# Patient Record
Sex: Female | Born: 1985
Health system: Southern US, Community
[De-identification: ages and names within clinical notes are randomized; demographics above are authoritative.]

## PROBLEM LIST (undated history)

## (undated) DIAGNOSIS — F341 Dysthymic disorder: Secondary | ICD-10-CM

## (undated) DIAGNOSIS — IMO0001 Reserved for inherently not codable concepts without codable children: Secondary | ICD-10-CM

## (undated) DIAGNOSIS — T7840XA Allergy, unspecified, initial encounter: Secondary | ICD-10-CM

## (undated) DIAGNOSIS — K5900 Constipation, unspecified: Secondary | ICD-10-CM

## (undated) DIAGNOSIS — B019 Varicella without complication: Secondary | ICD-10-CM

## (undated) DIAGNOSIS — L309 Dermatitis, unspecified: Secondary | ICD-10-CM

## (undated) DIAGNOSIS — H9319 Tinnitus, unspecified ear: Secondary | ICD-10-CM

## (undated) DIAGNOSIS — R1310 Dysphagia, unspecified: Secondary | ICD-10-CM

## (undated) DIAGNOSIS — Z8669 Personal history of other diseases of the nervous system and sense organs: Secondary | ICD-10-CM

## (undated) DIAGNOSIS — K625 Hemorrhage of anus and rectum: Secondary | ICD-10-CM

## (undated) DIAGNOSIS — Z9641 Presence of insulin pump (external) (internal): Secondary | ICD-10-CM

## (undated) DIAGNOSIS — E109 Type 1 diabetes mellitus without complications: Secondary | ICD-10-CM

## (undated) DIAGNOSIS — E063 Autoimmune thyroiditis: Secondary | ICD-10-CM

## (undated) DIAGNOSIS — Z464 Encounter for fitting and adjustment of orthodontic device: Secondary | ICD-10-CM

## (undated) DIAGNOSIS — K602 Anal fissure, unspecified: Secondary | ICD-10-CM

## (undated) DIAGNOSIS — E785 Hyperlipidemia, unspecified: Secondary | ICD-10-CM

## (undated) DIAGNOSIS — F429 Obsessive-compulsive disorder, unspecified: Secondary | ICD-10-CM

## (undated) DIAGNOSIS — F419 Anxiety disorder, unspecified: Secondary | ICD-10-CM

## (undated) DIAGNOSIS — O358XX0 Maternal care for other (suspected) fetal abnormality and damage, not applicable or unspecified: Secondary | ICD-10-CM

## (undated) DIAGNOSIS — K219 Gastro-esophageal reflux disease without esophagitis: Secondary | ICD-10-CM

## (undated) DIAGNOSIS — L509 Urticaria, unspecified: Secondary | ICD-10-CM

## (undated) DIAGNOSIS — O35BXX0 Maternal care for other (suspected) fetal abnormality and damage, fetal cardiac anomalies, not applicable or unspecified: Secondary | ICD-10-CM

## (undated) DIAGNOSIS — Z973 Presence of spectacles and contact lenses: Secondary | ICD-10-CM

## (undated) DIAGNOSIS — R7989 Other specified abnormal findings of blood chemistry: Secondary | ICD-10-CM

## (undated) DIAGNOSIS — J45909 Unspecified asthma, uncomplicated: Secondary | ICD-10-CM

## (undated) DIAGNOSIS — L739 Follicular disorder, unspecified: Secondary | ICD-10-CM

## (undated) DIAGNOSIS — T783XXA Angioneurotic edema, initial encounter: Secondary | ICD-10-CM

## (undated) DIAGNOSIS — H43811 Vitreous degeneration, right eye: Secondary | ICD-10-CM

## (undated) HISTORY — DX: Follicular disorder, unspecified: L73.9

## (undated) HISTORY — DX: Varicella without complication: B01.9

## (undated) HISTORY — PX: WISDOM TOOTH EXTRACTION: SHX21

## (undated) HISTORY — DX: Autoimmune thyroiditis: E06.3

## (undated) HISTORY — DX: Dermatitis, unspecified: L30.9

## (undated) HISTORY — DX: Anxiety disorder, unspecified: F41.9

## (undated) HISTORY — DX: Angioneurotic edema, initial encounter: T78.3XXA

## (undated) HISTORY — DX: Constipation, unspecified: K59.00

## (undated) HISTORY — DX: Gastro-esophageal reflux disease without esophagitis: K21.9

## (undated) HISTORY — DX: Vitreous degeneration, right eye: H43.811

## (undated) HISTORY — DX: Dysthymic disorder: F34.1

## (undated) HISTORY — DX: Urticaria, unspecified: L50.9

## (undated) HISTORY — DX: Allergy, unspecified, initial encounter: T78.40XA

## (undated) HISTORY — DX: Obsessive-compulsive disorder, unspecified: F42.9

## (undated) HISTORY — DX: Personal history of other diseases of the nervous system and sense organs: Z86.69

## (undated) HISTORY — DX: Hyperlipidemia, unspecified: E78.5

## (undated) HISTORY — DX: Other specified abnormal findings of blood chemistry: R79.89

## (undated) HISTORY — DX: Maternal care for other (suspected) fetal abnormality and damage, not applicable or unspecified: O35.8XX0

## (undated) HISTORY — DX: Dysphagia, unspecified: R13.10

## (undated) HISTORY — DX: Tinnitus, unspecified ear: H93.19

## (undated) HISTORY — DX: Anal fissure, unspecified: K60.2

## (undated) HISTORY — DX: Maternal care for other (suspected) fetal abnormality and damage, fetal cardiac anomalies, not applicable or unspecified: O35.BXX0

## (undated) HISTORY — DX: Hemorrhage of anus and rectum: K62.5

## (undated) HISTORY — PX: DENTAL SURGERY: SHX609

---

## 2016-09-27 LAB — HM PAP SMEAR

## 2017-01-14 DIAGNOSIS — N6320 Unspecified lump in the left breast, unspecified quadrant: Secondary | ICD-10-CM

## 2017-01-14 HISTORY — DX: Unspecified lump in the left breast, unspecified quadrant: N63.20

## 2018-07-15 ENCOUNTER — Other Ambulatory Visit: Payer: Self-pay

## 2018-07-15 ENCOUNTER — Encounter: Payer: Self-pay | Admitting: *Deleted

## 2018-07-18 NOTE — Discharge Instructions (Signed)
[facility] °[facility] °ENDOSCOPIC SINUS SURGERY °[facility] ° °What is Functional Endoscopic Sinus Surgery? ° The Surgery involves making the natural openings of the sinuses larger by removing the bony partitions that separate the sinuses from the nasal cavity.  The natural sinus lining is preserved as much as possible to allow the sinuses to resume normal function after the surgery.  In some patients nasal polyps (excessively swollen lining of the sinuses) may be removed to relieve obstruction of the sinus openings.  The surgery is performed through the nose using lighted scopes, which eliminates the need for incisions on the face.  A septoplasty is a different procedure which is sometimes performed with sinus surgery.  It involves straightening the boy partition that separates the two sides of your nose.  A crooked or deviated septum may need repair if is obstructing the sinuses or nasal airflow.  Turbinate reduction is also often performed during sinus surgery.  The turbinates are bony proturberances from the side walls of the nose which swell and can obstruct the nose in patients with sinus and allergy problems.  Their size can be surgically reduced to help relieve nasal obstruction. ° °What Can Sinus Surgery Do For Me? ° Sinus surgery can reduce the frequency of sinus infections requiring antibiotic treatment.  This can provide improvement in nasal congestion, post-nasal drainage, facial pressure and nasal obstruction.  Surgery will NOT prevent you from ever having an infection again, so it usually only for patients who get infections 4 or more times yearly requiring antibiotics, or for infections that do not clear with antibiotics.  It will not cure nasal allergies, so patients with allergies may still require medication to treat their allergies after surgery. Surgery may improve headaches related to sinusitis, however, some people will continue to  require medication to control sinus headaches related to allergies.  Surgery will do nothing for other forms of headache (migraine, tension or cluster). ° °What Are the Risks of Endoscopic Sinus Surgery? ° Current techniques allow surgery to be performed safely with little risk, however, there are rare complications that patients should be aware of.  Because the sinuses are located around the eyes, there is risk of eye injury, including blindness, though again, this would be quite rare. This is usually a result of bleeding behind the eye during surgery, which puts the vision oat risk, though there are treatments to protect the vision and prevent permanent disrupted by surgery causing a leak of the spinal fluid that surrounds the brain.  More serious complications would include bleeding inside the brain cavity or damage to the brain.  Again, all of these complications are uncommon, and spinal fluid leaks can be safely managed surgically if they occur.  The most common complication of sinus surgery is bleeding from the nose, which may require packing or cauterization of the nose.  Continued sinus have polyps may experience recurrence of the polyps requiring revision surgery.  Alterations of sense of smell or injury to the tear ducts are also rare complications.  ° °What is the Surgery Like, and what is the Recovery? ° The Surgery usually takes a couple of hours to perform, and is usually performed under a general anesthetic (completely asleep).  Patients are usually discharged home after a couple of hours.  Sometimes during surgery it is necessary to pack the nose to control bleeding, and the packing is left in place for 24 - 48 hours, and removed by your surgeon.    If a septoplasty was performed during the procedure, there is often a splint placed which must be removed after 5-7 days.   °Discomfort: Pain is usually mild to moderate, and can be controlled by prescription pain medication or acetaminophen (Tylenol).   Aspirin, Ibuprofen (Advil, Motrin), or Naprosyn (Aleve) should be avoided, as they can cause increased bleeding.  Most patients feel sinus pressure like they have a bad head cold for several days.  Sleeping with your head elevated can help reduce swelling and facial pressure, as can ice packs over the face.  A humidifier may be helpful to keep the mucous and blood from drying in the nose.  ° °Diet: There are no specific diet restrictions, however, you should generally start with clear liquids and a light diet of bland foods because the anesthetic can cause some nausea.  Advance your diet depending on how your stomach feels.  Taking your pain medication with food will often help reduce stomach upset which pain medications can cause. ° °Nasal Saline Irrigation: It is important to remove blood clots and dried mucous from the nose as it is healing.  This is done by having you irrigate the nose at least 3 - 4 times daily with a salt water solution.  We recommend using NeilMed Sinus Rinse (available at the drug store).  Fill the squeeze bottle with the solution, bend over a sink, and insert the tip of the squeeze bottle into the nose ½ of an inch.  Point the tip of the squeeze bottle towards the inside corner of the eye on the same side your irrigating.  Squeeze the bottle and gently irrigate the nose.  If you bend forward as you do this, most of the fluid will flow back out of the nose, instead of down your throat.   The solution should be warm, near body temperature, when you irrigate.   Each time you irrigate, you should use a full squeeze bottle.  ° °Note that if you are instructed to use Nasal Steroid Sprays at any time after your surgery, irrigate with saline BEFORE using the steroid spray, so you do not wash it all out of the nose. °Another product, Nasal Saline Gel (such as AYR Nasal Saline Gel) can be applied in each nostril 3 - 4 times daily to moisture the nose and reduce scabbing or crusting. ° °Bleeding:   Bloody drainage from the nose can be expected for several days, and patients are instructed to irrigate their nose frequently with salt water to help remove mucous and blood clots.  The drainage may be dark red or brown, though some fresh blood may be seen intermittently, especially after irrigation.  Do not blow you nose, as bleeding may occur. If you must sneeze, keep your mouth open to allow air to escape through your mouth. ° °If heavy bleeding occurs: Irrigate the nose with saline to rinse out clots, then spray the nose 3 - 4 times with Afrin Nasal Decongestant Spray.  The spray will constrict the blood vessels to slow bleeding.  Pinch the lower half of your nose shut to apply pressure, and lay down with your head elevated.  Ice packs over the nose may help as well. If bleeding persists despite these measures, you should notify your doctor.  Do not use the Afrin routinely to control nasal congestion after surgery, as it can result in worsening congestion and may affect healing.  ° ° ° °Activity: Return to work varies among patients. Most patients will be   out of work at least 5 - 7 days to recover.  Patient may return to work after they are off of narcotic pain medication, and feeling well enough to perform the functions of their job.  Patients must avoid heavy lifting (over 10 pounds) or strenuous physical for 2 weeks after surgery, so your employer may need to assign you to light duty, or keep you out of work longer if light duty is not possible.  NOTE: you should not drive, operate dangerous machinery, do any mentally demanding tasks or make any important legal or financial decisions while on narcotic pain medication and recovering from the general anesthetic.  °  °Call Your Doctor Immediately if You Have Any of the Following: °1. Bleeding that you cannot control with the above measures °2. Loss of vision, double vision, bulging of the eye or black eyes. °3. Fever over 101 degrees °4. Neck stiffness with  severe headache, fever, nausea and change in mental state. °You are always encourage to call anytime with concerns, however, please call with requests for pain medication refills during office hours. ° °Office Endoscopy: During follow-up visits your doctor will remove any packing or splints that may have been placed and evaluate and clean your sinuses endoscopically.  Topical anesthetic will be used to make this as comfortable as possible, though you may want to take your pain medication prior to the visit.  How often this will need to be done varies from patient to patient.  After complete recovery from the surgery, you may need follow-up endoscopy from time to time, particularly if there is concern of recurrent infection or nasal polyps. ° ° °General Anesthesia, Adult, Care After °These instructions provide you with information about caring for yourself after your procedure. Your health care provider may also give you more specific instructions. Your treatment has been planned according to current medical practices, but problems sometimes occur. Call your health care provider if you have any problems or questions after your procedure. °What can I expect after the procedure? °After the procedure, it is common to have: °· Vomiting. °· A sore throat. °· Mental slowness. ° °It is common to feel: °· Nauseous. °· Cold or shivery. °· Sleepy. °· Tired. °· Sore or achy, even in parts of your body where you did not have surgery. ° °Follow these instructions at home: °For at least 24 hours after the procedure: °· Do not: °? Participate in activities where you could fall or become injured. °? Drive. °? Use heavy machinery. °? Drink alcohol. °? Take sleeping pills or medicines that cause drowsiness. °? Make important decisions or sign legal documents. °? Take care of children on your own. °· Rest. °Eating and drinking °· If you vomit, drink water, juice, or soup when you can drink without vomiting. °· Drink enough fluid to  keep your urine clear or pale yellow. °· Make sure you have little or no nausea before eating solid foods. °· Follow the diet recommended by your health care provider. °General instructions °· Have a responsible adult stay with you until you are awake and alert. °· Return to your normal activities as told by your health care provider. Ask your health care provider what activities are safe for you. °· Take over-the-counter and prescription medicines only as told by your health care provider. °· If you smoke, do not smoke without supervision. °· Keep all follow-up visits as told by your health care provider. This is important. °Contact a health care provider if: °· You   continue to have nausea or vomiting at home, and medicines are not helpful. °· You cannot drink fluids or start eating again. °· You cannot urinate after 8-12 hours. °· You develop a skin rash. °· You have fever. °· You have increasing redness at the site of your procedure. °Get help right away if: °· You have difficulty breathing. °· You have chest pain. °· You have unexpected bleeding. °· You feel that you are having a life-threatening or urgent problem. °This information is not intended to replace advice given to you by your health care provider. Make sure you discuss any questions you have with your health care provider. °Document Released: 03/04/2001 Document Revised: 04/30/2016 Document Reviewed: 11/10/2015 °Elsevier Interactive Patient Education © 2018 [employer] ° °

## 2018-07-24 ENCOUNTER — Encounter
Admission: RE | Disposition: A | Discharge status: Home / Self Care | Payer: Self-pay | Source: Ambulatory Visit | Attending: Otolaryngology

## 2018-07-24 ENCOUNTER — Ambulatory Visit
Admission: RE | Admit: 2018-07-24 | Discharge: 2018-07-24 | Disposition: A | Discharge status: Home / Self Care | Source: Ambulatory Visit | Attending: Otolaryngology | Admitting: Otolaryngology

## 2018-07-24 ENCOUNTER — Ambulatory Visit: Admitting: Anesthesiology

## 2018-07-24 DIAGNOSIS — J3489 Other specified disorders of nose and nasal sinuses: Secondary | ICD-10-CM | POA: Diagnosis not present

## 2018-07-24 DIAGNOSIS — J342 Deviated nasal septum: Secondary | ICD-10-CM | POA: Insufficient documentation

## 2018-07-24 DIAGNOSIS — E109 Type 1 diabetes mellitus without complications: Secondary | ICD-10-CM | POA: Diagnosis not present

## 2018-07-24 DIAGNOSIS — J45909 Unspecified asthma, uncomplicated: Secondary | ICD-10-CM | POA: Insufficient documentation

## 2018-07-24 DIAGNOSIS — Z79899 Other long term (current) drug therapy: Secondary | ICD-10-CM | POA: Diagnosis not present

## 2018-07-24 DIAGNOSIS — J343 Hypertrophy of nasal turbinates: Secondary | ICD-10-CM | POA: Diagnosis not present

## 2018-07-24 HISTORY — DX: Unspecified asthma, uncomplicated: J45.909

## 2018-07-24 HISTORY — DX: Reserved for inherently not codable concepts without codable children: IMO0001

## 2018-07-24 HISTORY — PX: ENDOSCOPIC CONCHA BULLOSA RESECTION: SHX6395

## 2018-07-24 HISTORY — DX: Type 1 diabetes mellitus without complications: E10.9

## 2018-07-24 HISTORY — PX: TURBINATE REDUCTION: SHX6157

## 2018-07-24 HISTORY — DX: Presence of insulin pump (external) (internal): Z96.41

## 2018-07-24 HISTORY — DX: Presence of spectacles and contact lenses: Z97.3

## 2018-07-24 HISTORY — PX: SEPTOPLASTY: SHX2393

## 2018-07-24 HISTORY — DX: Encounter for fitting and adjustment of orthodontic device: Z46.4

## 2018-07-24 LAB — GLUCOSE, CAPILLARY
GLUCOSE-CAPILLARY: 136 mg/dL — AB (ref 70–99)
GLUCOSE-CAPILLARY: 157 mg/dL — AB (ref 70–99)

## 2018-07-24 SURGERY — SEPTOPLASTY, NOSE
Anesthesia: General | Site: Nose | Wound class: Clean Contaminated

## 2018-07-24 MED ORDER — MIDAZOLAM HCL 5 MG/5ML IJ SOLN
INTRAMUSCULAR | Status: DC | PRN
  Administered 2018-07-24: 2 mg via INTRAVENOUS

## 2018-07-24 MED ORDER — DEXAMETHASONE SODIUM PHOSPHATE 4 MG/ML IJ SOLN
INTRAMUSCULAR | Status: DC | PRN
  Administered 2018-07-24: 4 mg via INTRAVENOUS

## 2018-07-24 MED ORDER — OXYMETAZOLINE HCL 0.05 % NA SOLN
2.0000 | Freq: Once | NASAL | Status: AC
  Administered 2018-07-24: 2 via NASAL

## 2018-07-24 MED ORDER — OXYCODONE HCL 5 MG PO TABS: 5.0000 mg | ORAL_TABLET | Freq: Once | ORAL | Status: DC | PRN

## 2018-07-24 MED ORDER — SCOPOLAMINE 1 MG/3DAYS TD PT72
1.0000 | MEDICATED_PATCH | TRANSDERMAL | Status: DC
  Administered 2018-07-24: 1.5 mg via TRANSDERMAL

## 2018-07-24 MED ORDER — ROCURONIUM BROMIDE 100 MG/10ML IV SOLN
INTRAVENOUS | Status: DC | PRN
  Administered 2018-07-24: 20 mg via INTRAVENOUS

## 2018-07-24 MED ORDER — FENTANYL CITRATE (PF) 100 MCG/2ML IJ SOLN: 25.0000 ug | INTRAMUSCULAR | Status: DC | PRN

## 2018-07-24 MED ORDER — LACTATED RINGERS IV SOLN
1000.0000 mL | INTRAVENOUS | Status: DC
  Administered 2018-07-24: 1000 mL via INTRAVENOUS

## 2018-07-24 MED ORDER — FENTANYL CITRATE (PF) 100 MCG/2ML IJ SOLN
INTRAMUSCULAR | Status: DC | PRN
  Administered 2018-07-24: 50 ug via INTRAVENOUS
  Administered 2018-07-24: 25 ug via INTRAVENOUS

## 2018-07-24 MED ORDER — ONDANSETRON HCL 4 MG/2ML IJ SOLN
INTRAMUSCULAR | Status: DC | PRN
  Administered 2018-07-24: 4 mg via INTRAVENOUS

## 2018-07-24 MED ORDER — PHENYLEPHRINE HCL 0.5 % NA SOLN
NASAL | Status: DC | PRN
  Administered 2018-07-24: 15 mL via TOPICAL

## 2018-07-24 MED ORDER — LIDOCAINE HCL (CARDIAC) PF 100 MG/5ML IV SOSY
PREFILLED_SYRINGE | INTRAVENOUS | Status: DC | PRN
  Administered 2018-07-24: 40 mg via INTRAVENOUS

## 2018-07-24 MED ORDER — GLYCOPYRROLATE 0.2 MG/ML IJ SOLN
INTRAMUSCULAR | Status: DC | PRN
  Administered 2018-07-24: .1 mg via INTRAVENOUS

## 2018-07-24 MED ORDER — OXYCODONE HCL 5 MG/5ML PO SOLN: 5.0000 mg | Freq: Once | ORAL | Status: DC | PRN

## 2018-07-24 MED ORDER — LIDOCAINE-EPINEPHRINE 1 %-1:100000 IJ SOLN
INTRAMUSCULAR | Status: DC | PRN
  Administered 2018-07-24: 7 mL

## 2018-07-24 MED ORDER — DEXTROSE 5 % IV SOLN
1000.0000 mg | Freq: Once | INTRAVENOUS | Status: AC
  Administered 2018-07-24: 1000 mg via INTRAVENOUS

## 2018-07-24 MED ORDER — PROPOFOL 10 MG/ML IV BOLUS
INTRAVENOUS | Status: DC | PRN
  Administered 2018-07-24: 120 mg via INTRAVENOUS

## 2018-07-24 SURGICAL SUPPLY — 27 items
CANISTER SUCT 1200ML W/VALVE (MISCELLANEOUS) ×4 IMPLANT
CATH IV 18X1 1/4 SAFELET (CATHETERS) ×4 IMPLANT
COAGULATOR SUCT 8FR VV (MISCELLANEOUS) ×4 IMPLANT
DRAPE HEAD BAR (DRAPES) ×4 IMPLANT
ELECT REM PT RETURN 9FT ADLT (ELECTROSURGICAL) ×4
ELECTRODE REM PT RTRN 9FT ADLT (ELECTROSURGICAL) ×2 IMPLANT
GLOVE PI ULTRA LF STRL 7.5 (GLOVE) ×4 IMPLANT
GLOVE PI ULTRA NON LATEX 7.5 (GLOVE) ×4
IV CATH 18X1 1/4 SAFELET (CATHETERS) ×2
KIT TURNOVER KIT A (KITS) ×4 IMPLANT
NEEDLE ANESTHESIA  27G X 3.5 (NEEDLE) ×2
NEEDLE ANESTHESIA 27G X 3.5 (NEEDLE) ×2 IMPLANT
PACK DRAPE NASAL/ENT (PACKS) ×4 IMPLANT
PACKING NASAL EPIS 4X2.4 XEROG (MISCELLANEOUS) ×4 IMPLANT
PATTIES SURGICAL .5 X3 (DISPOSABLE) ×4 IMPLANT
SOL ANTI-FOG 6CC FOG-OUT (MISCELLANEOUS) ×2 IMPLANT
SOL FOG-OUT ANTI-FOG 6CC (MISCELLANEOUS) ×2
SPLINT NASAL SEPTAL BLV .50 ST (MISCELLANEOUS) ×4 IMPLANT
STRAP BODY AND KNEE 60X3 (MISCELLANEOUS) ×8 IMPLANT
SUT CHROMIC 3-0 (SUTURE) ×2
SUT CHROMIC 3-0 KS 27XMFL CR (SUTURE) ×2
SUT ETHILON 3-0 KS 30 BLK (SUTURE) ×4 IMPLANT
SUT PLAIN GUT 4-0 (SUTURE) ×4 IMPLANT
SUTURE CHRMC 3-0 KS 27XMFL CR (SUTURE) ×2 IMPLANT
SYR 3ML LL SCALE MARK (SYRINGE) ×4 IMPLANT
TOWEL OR 17X26 4PK STRL BLUE (TOWEL DISPOSABLE) ×4 IMPLANT
WATER STERILE IRR 250ML POUR (IV SOLUTION) ×4 IMPLANT

## 2018-07-24 NOTE — Anesthesia Postprocedure Evaluation (Signed)
Anesthesia Post Note  Patient: Danniell Faidley  Procedure(s) Performed: SEPTOPLASTY (N/A Nose) INFERIOR TURBINATE REDUCTION (Bilateral Nose) ENDOSCOPIC CONCHA BULLOSA MIDDLE TURBINATE (Bilateral Nose)  Patient location during evaluation: PACU Anesthesia Type: General Level of consciousness: awake and alert Pain management: pain level controlled Vital Signs Assessment: post-procedure vital signs reviewed and stable Respiratory status: spontaneous breathing Cardiovascular status: blood pressure returned to baseline Postop Assessment: no headache Anesthetic complications: no    Verner Cholunkle, III,  Chloeanne Poteet D

## 2018-07-24 NOTE — Op Note (Signed)
07/24/2018  8:59 AM  [id number]   Pre-Op Dx:  Deviated Nasal Septum, Hypertrophic Inferior Turbinates, concha bullosa of both middle turbinates  Post-op Dx: Same  Proc: Nasal Septoplasty, Bilateral Partial Reduction Inferior Turbinates, endoscopic trimming of the middle turbinate concha bullosa bilaterally  Surg:  Beverly SessionsPaul H Calistro Rauf  Anes:  GOT  EBL: 100 mL  Comp: None  Findings: Septum deviated to the left side.  Large concha bullosa of both middle turbinates.  Enlarged inferior turbinates affecting airway.   Procedure: With the patient in a comfortable supine position,  general orotracheal anesthesia was induced without difficulty.     The patient received preoperative Afrin spray for topical decongestion and vasoconstriction.  Intravenous prophylactic antibiotics were administered.  At an appropriate level, the patient was placed in a semi-sitting position.  Nasal vibrissae were trimmed.   1% Xylocaine with 1:100,000 epinephrine, 7 cc's, was infiltrated into the anterior floor of the nose, into the nasal spine region, into the membranous columella, and finally into the submucoperichondrial plane of the septum on both sides.  Several minutes were allowed for this to take effect.  Cottoniod pledgetts soaked in Afrin and 4% Xylocaine were placed into both nasal cavities and left while the patient was prepped and draped in the standard fashion.  The materials were removed from the nose and observed to be intact and correct in number.  The nose was inspected with a headlight in the 0 degrees scope with the findings as described above.  A left Killian incision was sharply executed and carried down to the quadrangular cartilage. The mucoperichondrium was elelvated along the quadrangular plate back to the bony-cartilaginous junction. The mucoperiostium was then elevated along the ethmoid plate and the vomer. The boney-catilaginous junction was then split with a freer elevator and the  mucoperiosteum was elevated on the opposite side. The mucoperiosteum was then elevated along the maxillary crest as needed to expose the crooked bone of the crest.  Boney spurs of the vomer and maxillary crest were removed with Lenoria Chimeakahashi forceps.  The cartilaginous plate was trimmed along its posterior and inferior borders of about 2 mm of cartilage to free it up inferiorly. Some of the deviated ethmoid plate was then fractured and removed with Takahashi forceps to free up the posterior border of the quadrangular plate and allow it to swing back to the midline. The mucosal flaps were placed back into their anatomic position to allow visualization of the airways. The septum now sat in the midline with an improved airway.  A 3-0 Chromic suture on a Keith needle in used to anchor the inferior septum at the nasal spine with a through and through suture. The mucosal flaps are then sutured together using a through and through whip stitch of 4-0 Plain Gut with a mini-Keith needle.  This was used to close the [place] incision as well.  The middle turbinates were inspected with the 0 degrees scope.  The both were widened and enlarged because of the concha bullosa.  Right side was worse.  The anterior border of the right middle turbinate was then incised to open up into the large concha bullosa.  The lateral wall of the concha bullosa was removed.  This left a scroll shaped middle turbinate on the right side.  Bleeding was controlled with electrocautery along the trimmed edge of the turbinate.  The left side was then trimmed in a similar fashion with incising some of the anterior inferior portion of the middle turbinate and the entire lateral  wall of the concha bullosa.  Again electrocautery was used long as trimmed edge to control bleeding.  This left more open airway on both sides.  The inferior turbinates were then inspected. An incision was created along the inferior aspect of the left inferior turbinate with  removal of some of the inferior soft tissue and bone. Electrocautery was used to control bleeding in the area. The remaining turbinate was then outfractured to open up the airway further. There was no significant bleeding noted. The right turbinate was then trimmed and outfractured in a similar fashion.  The airways were then visualized and showed open passageways on both sides that were significantly improved compared to before surgery. There was no signifcant bleeding.  Xerogel was then placed in the middle meatus on both sides to cover over the trimmed middle turbinate.  This was wetted and covering all the raw edges.  Nasal splints were applied to both sides of the septum using Xomed 0.195mm regular sized splints that were trimmed, and then held in position with a 3-0 Nylon through and through suture.  The patient was turned back over to anesthesia, and awakened, extubated, and taken to the PACU in satisfactory condition.  Dispo:   PACU to home  Plan: Ice, elevation, narcotic analgesia, steroid taper, and prophylactic antibiotics for the duration of indwelling nasal foreign bodies.  We will reevaluate the patient in the office in 6 days and remove the septal splints.  Return to work in 10 days, strenuous activities in two weeks.   Beverly Sessionsaul H Taren Dymek 07/24/2018 8:59 AM

## 2018-07-24 NOTE — Transfer of Care (Signed)
Immediate Anesthesia Transfer of Care Note  Patient: Nicole Carrillo  Procedure(s) Performed: SEPTOPLASTY (N/A Nose) INFERIOR TURBINATE REDUCTION (Bilateral Nose) ENDOSCOPIC CONCHA BULLOSA MIDDLE TURBINATE (Bilateral Nose)  Patient Location: PACU  Anesthesia Type: General ETT  Level of Consciousness: awake, alert  and patient cooperative  Airway and Oxygen Therapy: Patient Spontanous Breathing and Patient connected to supplemental oxygen  Post-op Assessment: Post-op Vital signs reviewed, Patient's Cardiovascular Status Stable, Respiratory Function Stable, Patent Airway and No signs of Nausea or vomiting  Post-op Vital Signs: Reviewed and stable  Complications: No apparent anesthesia complications

## 2018-07-24 NOTE — Anesthesia Preprocedure Evaluation (Signed)
Anesthesia Evaluation  Patient identified by MRN, date of birth, ID band Patient awake    Airway Mallampati: I  TM Distance: >3 FB Neck ROM: full    Dental no notable dental hx.    Pulmonary asthma ,    Pulmonary exam normal breath sounds clear to auscultation       Cardiovascular negative cardio ROS Normal cardiovascular exam     Neuro/Psych    GI/Hepatic negative GI ROS, Neg liver ROS,   Endo/Other  diabetes, Well Controlled, Type 1, Insulin Dependent  Renal/GU      Musculoskeletal   Abdominal   Peds  Hematology negative hematology ROS (+)   Anesthesia Other Findings   Reproductive/Obstetrics                             Anesthesia Physical Anesthesia Plan  ASA: II  Anesthesia Plan: General ETT   Post-op Pain Management:    Induction:   PONV Risk Score and Plan:   Airway Management Planned:   Additional Equipment:   Intra-op Plan:   Post-operative Plan:   Informed Consent: I have reviewed the patients History and Physical, chart, labs and discussed the procedure including the risks, benefits and alternatives for the proposed anesthesia with the patient or authorized representative who has indicated his/her understanding and acceptance.     Plan Discussed with:   Anesthesia Plan Comments:         Anesthesia Quick Evaluation

## 2018-07-24 NOTE — Anesthesia Procedure Notes (Signed)
Procedure Name: Intubation Date/Time: 07/24/2018 7:51 AM Performed by: Jimmy PicketAmyot, Stephanne Greeley, CRNA Pre-anesthesia Checklist: Patient identified, Emergency Drugs available, Suction available, Patient being monitored and Timeout performed Patient Re-evaluated:Patient Re-evaluated prior to induction Oxygen Delivery Method: Circle system utilized Preoxygenation: Pre-oxygenation with 100% oxygen Induction Type: IV induction Ventilation: Mask ventilation without difficulty Laryngoscope Size: Miller and 2 Grade View: Grade I Tube type: Oral Rae Tube size: 7.0 mm Number of attempts: 1 Placement Confirmation: ETT inserted through vocal cords under direct vision,  positive ETCO2 and breath sounds checked- equal and bilateral Tube secured with: Tape Dental Injury: Teeth and Oropharynx as per pre-operative assessment

## 2018-07-24 NOTE — H&P (Signed)
H&P has been reviewedand patient reevaluated,  and no changes necessary. To be downloaded later.  

## 2019-05-07 LAB — TSH: TSH: 1.15 (ref <=5.90)

## 2019-05-07 LAB — HEMOGLOBIN A1C: Hemoglobin A1C: 7.7

## 2019-07-20 LAB — OB RESULTS CONSOLE ABO/RH: RH Type: POSITIVE

## 2019-07-20 LAB — OB RESULTS CONSOLE PLATELET COUNT: Platelets: 370

## 2019-07-20 LAB — OB RESULTS CONSOLE RPR: RPR: NONREACTIVE

## 2019-07-20 LAB — OB RESULTS CONSOLE HGB/HCT, BLOOD
HCT: 36 (ref 29–41)
Hemoglobin: 12.4

## 2019-07-20 LAB — OB RESULTS CONSOLE HEPATITIS B SURFACE ANTIGEN: Hepatitis B Surface Ag: NEGATIVE

## 2019-07-20 LAB — OB RESULTS CONSOLE HIV ANTIBODY (ROUTINE TESTING): HIV: NONREACTIVE

## 2019-07-20 LAB — OB RESULTS CONSOLE ANTIBODY SCREEN: Antibody Screen: NEGATIVE

## 2019-08-13 DIAGNOSIS — J45909 Unspecified asthma, uncomplicated: Secondary | ICD-10-CM | POA: Insufficient documentation

## 2019-08-13 DIAGNOSIS — K219 Gastro-esophageal reflux disease without esophagitis: Secondary | ICD-10-CM | POA: Insufficient documentation

## 2019-08-13 LAB — OB RESULTS CONSOLE GC/CHLAMYDIA
Chlamydia: NEGATIVE
Gonorrhea: NEGATIVE

## 2019-09-14 LAB — OB RESULTS CONSOLE HGB/HCT, BLOOD: Hemoglobin: 12.8

## 2019-09-16 LAB — HEMOGLOBIN A1C: A1c: 6.8

## 2019-10-14 LAB — OB RESULTS CONSOLE HGB/HCT, BLOOD: Hemoglobin: 11.8

## 2019-10-22 LAB — CBC AND DIFFERENTIAL
HCT: 37 (ref 36–46)
Hemoglobin: 11.9 — AB (ref 12.0–16.0)
Platelets: 394 (ref 150–399)
WBC: 9

## 2019-10-22 LAB — BASIC METABOLIC PANEL
BUN: 13 (ref 4–21)
Creatinine: 0.6 (ref 0.5–1.1)

## 2019-10-22 LAB — CBC: RBC: 3.98 (ref 3.87–5.11)

## 2019-11-10 ENCOUNTER — Encounter: Payer: Self-pay | Admitting: *Deleted

## 2019-11-16 ENCOUNTER — Encounter: Admitting: Obstetrics & Gynecology

## 2019-11-23 ENCOUNTER — Other Ambulatory Visit: Payer: Self-pay

## 2019-11-23 ENCOUNTER — Other Ambulatory Visit (HOSPITAL_COMMUNITY)
Admission: RE | Admit: 2019-11-23 | Discharge: 2019-11-23 | Disposition: A | Discharge status: Home / Self Care | Source: Ambulatory Visit | Attending: Obstetrics & Gynecology | Admitting: Obstetrics & Gynecology

## 2019-11-23 ENCOUNTER — Encounter: Payer: Self-pay | Admitting: Obstetrics & Gynecology

## 2019-11-23 ENCOUNTER — Ambulatory Visit (INDEPENDENT_AMBULATORY_CARE_PROVIDER_SITE_OTHER): Payer: 59 | Admitting: Obstetrics & Gynecology

## 2019-11-23 VITALS — BP 125/75 | HR 96 | Wt 124.9 lb

## 2019-11-23 DIAGNOSIS — O26892 Other specified pregnancy related conditions, second trimester: Secondary | ICD-10-CM

## 2019-11-23 DIAGNOSIS — Z3A24 24 weeks gestation of pregnancy: Secondary | ICD-10-CM

## 2019-11-23 DIAGNOSIS — E063 Autoimmune thyroiditis: Secondary | ICD-10-CM

## 2019-11-23 DIAGNOSIS — O0992 Supervision of high risk pregnancy, unspecified, second trimester: Secondary | ICD-10-CM

## 2019-11-23 DIAGNOSIS — N898 Other specified noninflammatory disorders of vagina: Secondary | ICD-10-CM

## 2019-11-23 DIAGNOSIS — O24019 Pre-existing diabetes mellitus, type 1, in pregnancy, unspecified trimester: Secondary | ICD-10-CM | POA: Insufficient documentation

## 2019-11-23 DIAGNOSIS — O2342 Unspecified infection of urinary tract in pregnancy, second trimester: Secondary | ICD-10-CM

## 2019-11-23 DIAGNOSIS — O0990 Supervision of high risk pregnancy, unspecified, unspecified trimester: Secondary | ICD-10-CM | POA: Insufficient documentation

## 2019-11-23 DIAGNOSIS — B3731 Acute candidiasis of vulva and vagina: Secondary | ICD-10-CM

## 2019-11-23 DIAGNOSIS — O24012 Pre-existing diabetes mellitus, type 1, in pregnancy, second trimester: Secondary | ICD-10-CM

## 2019-11-23 DIAGNOSIS — B373 Candidiasis of vulva and vagina: Secondary | ICD-10-CM

## 2019-11-23 LAB — HEMOGLOBIN A1C: A1c: 7

## 2019-11-23 LAB — CYTOLOGY - PAP: Pap: NEGATIVE

## 2019-11-23 NOTE — Patient Instructions (Signed)
TDaP Vaccine Pregnancy Get the Whooping Cough Vaccine While You Are Pregnant (CDC)  It is important for women to get the whooping cough vaccine in the third trimester of each pregnancy. Vaccines are the best way to prevent this disease. There are 2 different whooping cough vaccines. Both vaccines combine protection against whooping cough, tetanus and diphtheria, but they are for different age groups: Tdap: for everyone 11 years or older, including pregnant women  DTaP: for children 2 months through 6 years of age  You need the whooping cough vaccine during each of your pregnancies The recommended time to get the shot is during your 27th through 36th week of pregnancy, preferably during the earlier part of this time period. The Centers for Disease Control and Prevention (CDC) recommends that pregnant women receive the whooping cough vaccine for adolescents and adults (called Tdap vaccine) during the third trimester of each pregnancy. The recommended time to get the shot is during your 27th through 36th week of pregnancy, preferably during the earlier part of this time period. This replaces the original recommendation that pregnant women get the vaccine only if they had not previously received it. The American College of Obstetricians and Gynecologists and the American College of Nurse-Midwives support this recommendation.  You should get the whooping cough vaccine while pregnant to pass protection to your baby frame support disabled and/or not supported in this browser  Learn why Nicole Carrillo decided to get the whooping cough vaccine in her 3rd trimester of pregnancy and how her baby girl was born with some protection against the disease. Also available on YouTube. After receiving the whooping cough vaccine, your body will create protective antibodies (proteins produced by the body to fight off diseases) and pass some of them to your baby before birth. These antibodies provide your baby some short-term  protection against whooping cough in early life. These antibodies can also protect your baby from some of the more serious complications that come along with whooping cough. Your protective antibodies are at their highest about 2 weeks after getting the vaccine, but it takes time to pass them to your baby. So the preferred time to get the whooping cough vaccine is early in your third trimester. The amount of whooping cough antibodies in your body decreases over time. That is why CDC recommends you get a whooping cough vaccine during each pregnancy. Doing so allows each of your babies to get the greatest number of protective antibodies from you. This means each of your babies will get the best protection possible against this disease.  Getting the whooping cough vaccine while pregnant is better than getting the vaccine after you give birth Whooping cough vaccination during pregnancy is ideal so your baby will have short-term protection as soon as he is born. This early protection is important because your baby will not start getting his whooping cough vaccines until he is 2 months old. These first few months of life are when your baby is at greatest risk for catching whooping cough. This is also when he's at greatest risk for having severe, potentially life-threating complications from the infection. To avoid that gap in protection, it is best to get a whooping cough vaccine during pregnancy. You will then pass protection to your baby before he is born. To continue protecting your baby, he should get whooping cough vaccines starting at 2 months old. You may never have gotten the Tdap vaccine before and did not get it during this pregnancy. If so, you should make sure   to get the vaccine immediately after you give birth, before leaving the hospital or birthing center. It will take about 2 weeks before your body develops protection (antibodies) in response to the vaccine. Once you have protection from the vaccine,  you are less likely to give whooping cough to your newborn while caring for him. But remember, your baby will still be at risk for catching whooping cough from others. A recent study looked to see how effective Tdap was at preventing whooping cough in babies whose mothers got the vaccine while pregnant or in the hospital after giving birth. The study found that getting Tdap between 27 through 36 weeks of pregnancy is 85% more effective at preventing whooping cough in babies younger than 2 months old. Blood tests cannot tell if you need a whooping cough vaccine There are no blood tests that can tell you if you have enough antibodies in your body to protect yourself or your baby against whooping cough. Even if you have been sick with whooping cough in the past or previously received the vaccine, you still should get the vaccine during each pregnancy. Breastfeeding may pass some protective antibodies onto your baby By breastfeeding, you may pass some antibodies you have made in response to the vaccine to your baby. When you get a whooping cough vaccine during your pregnancy, you will have antibodies in your breast milk that you can share with your baby as soon as your milk comes in. However, your baby will not get protective antibodies immediately if you wait to get the whooping cough vaccine until after delivering your baby. This is because it takes about 2 weeks for your body to create antibodies. Learn more about the health benefits of breastfeeding.  

## 2019-11-23 NOTE — Progress Notes (Signed)
PRENATAL VISIT NOTE  Subjective:  Nicole Carrillo is a 33 y.o. G1P0 at [redacted]w[redacted]d being seen today for transfer of prenatal care from [place]. Please refer to notes as filed under Media tab.  She is currently monitored for the following issues for this high-risk pregnancy and has Type 1 diabetes mellitus in pregnancy; Hashimoto's thyroiditis; and Supervision of high-risk pregnancy on their problem list.  Patient is followed by Endocrinology at [facility], already had anatomy scan at [facility] and normal fetal ECHO.  Has insulin pump to manage her T1DM.  Normal TSH levels, no thyroid therapy.    Patient reports increased vaginal discharge, faint greenish in color. No irritation.  Contractions: Not present. Vag. Bleeding: None.  Movement: Present. Denies leaking of fluid.   History of UTIs, wants surveillance urine culture today.   The following portions of the patient's history were reviewed and updated as appropriate: allergies, current medications, past family history, past medical history, past social history, past surgical history and problem list.   Objective:   Vitals:   11/23/19 0910  BP: 125/75  Pulse: 96  Weight: 124 lb 14.4 oz (56.7 kg)    Fetal Status: Fetal Heart Rate (bpm): 146   Movement: Present     General:  Alert, oriented and cooperative. Patient is in no acute distress.  Skin: Skin is warm and dry. No rash noted.   Cardiovascular: Normal heart rate noted  Respiratory: Normal respiratory effort, no problems with respiration noted  Abdomen: Soft, gravid, appropriate for gestational age.  Pain/Pressure: Present     Pelvic: Cervical exam deferred        Extremities: Normal range of motion.  Edema: None  Mental Status: Normal mood and affect. Normal behavior. Normal judgment and thought content.   Assessment and Plan:  Pregnancy: G1P0 at [redacted]w[redacted]d 1. Type 1 diabetes mellitus during pregnancy in second trimester 2. Hashimoto's thyroiditis Will continue to be followed by [facility] Endocrinology,  and they will manage insulin pump adjustments. Will follow up their recommendations. MFM referral and scan ordered. Patient aware of need of antenatal testing starting at 32 weeks, and delivery at 39 weeks or earlier.  Will follow up MFM recommendations. - AMB referral to maternal fetal medicine - [place] MFM OB DETAIL +14 WK; Future  3. Vaginal discharge during pregnancy in second trimester Will follow up results and manage accordingly. - Cervicovaginal ancillary only( [place])  4. Urinary tract infection in mother during second trimester of pregnancy - Culture, OB Urine  5. Supervision of high risk pregnancy, antepartum - [facility] AMB BABYSCRIPTS SCHEDULE OPTIMIZATION - fexofenadine (ALLEGRA) 180 MG tablet; Take 180 mg by mouth daily. - aspirin EC 81 MG tablet; Take 81 mg by mouth daily. - Prenatal MV-Min-Fe Fum-FA-DHA (PRENATAL+DHA PO); Take by mouth. - AMB referral to maternal fetal medicine - [place] MFM OB DETAIL +14 WK; Future The nature of [facility] with multiple MDs and other Advanced Practice Providers was explained to patient; also emphasized that residents, students are part of our team.  Preterm labor symptoms and general obstetric precautions including but not limited to vaginal bleeding, contractions, leaking of fluid and fetal movement were reviewed in detail with the patient. Please refer to After Visit Summary for other counseling recommendations.   Return in about 3 weeks (around 12/14/2019) for OFFICE OB Visit, 3rd trimester labs, TDap.  Future Appointments  Date Time Provider Department Center  12/16/2019  2:00 PM WH-MFC [place] 3 WH-MFCUS MFC-US  12/16/2019  2:05  PM Pace Craig MFC-US  12/17/2019  8:15 AM Rasch, Artist Pais, NP WOC-WOCA WOC    Verita Schneiders, MD

## 2019-11-24 LAB — CERVICOVAGINAL ANCILLARY ONLY
Bacterial Vaginitis (gardnerella): NEGATIVE
Candida Glabrata: NEGATIVE
Candida Vaginitis: POSITIVE — AB
Chlamydia: NEGATIVE
Comment: NEGATIVE
Comment: NEGATIVE
Comment: NEGATIVE
Comment: NEGATIVE
Comment: NEGATIVE
Comment: NORMAL
Neisseria Gonorrhea: NEGATIVE
Trichomonas: NEGATIVE

## 2019-11-24 MED ORDER — TERCONAZOLE 0.8 % VA CREA: 1.0000 | TOPICAL_CREAM | Freq: Every day | VAGINAL | 0 refills | Status: DC

## 2019-11-24 NOTE — Addendum Note (Signed)
Addended by: Verita Schneiders A on: 11/24/2019 08:23 PM   Modules accepted: Orders

## 2019-11-25 ENCOUNTER — Telehealth: Payer: Self-pay | Admitting: Lactation Services

## 2019-11-25 DIAGNOSIS — B373 Candidiasis of vulva and vagina: Secondary | ICD-10-CM

## 2019-11-25 DIAGNOSIS — B3731 Acute candidiasis of vulva and vagina: Secondary | ICD-10-CM

## 2019-11-25 NOTE — Telephone Encounter (Signed)
-----   Message from Osborne Oman, MD sent at 11/24/2019  8:23 PM EST ----- Vaginal discharge test is abnormal and showed yeast infection. Terazol was prescribed. Please inform patient of results and advise to pick up prescription.

## 2019-11-25 NOTE — Telephone Encounter (Signed)
Called pt to let her know know that her swab showed she is positive for yeast. Pt was informed Lonn [place] has been ordered and is at her pharmacy. Advised pt to apply to vagina every night for 3 nights. Pt voiced understanding.Marland Kitchen

## 2019-12-07 ENCOUNTER — Telehealth: Payer: Self-pay | Admitting: *Deleted

## 2019-12-07 DIAGNOSIS — O24013 Pre-existing diabetes mellitus, type 1, in pregnancy, third trimester: Secondary | ICD-10-CM

## 2019-12-07 DIAGNOSIS — E063 Autoimmune thyroiditis: Secondary | ICD-10-CM

## 2019-12-07 NOTE — Telephone Encounter (Signed)
Nicole Carrillo called and left a voice message this afternoon stating she is supposed to have labs drawn 12/17/19. States she wants to know if they include hemoglobin A1c and TSH. States she is diabetic and goes to an endocrinologist but is only doing online visits with them. Wants to have labs with [place] that she can report to endocrinologist. States not sure if that was clear to our doctor and would like a call back.   I called Nicole Carrillo and we discussed her request . I explained I do not see a problem with that request ;but I do  Need to get doctors approval and will forward to her ; if any issues we would call her back. She voices understanding.  Nicole Beckum,RN

## 2019-12-11 NOTE — L&D Delivery Note (Signed)
OB/GYN Faculty Practice Delivery Note  Nicole Carrillo is a 34 y.o. G1P0 s/p VD at [redacted]w[redacted]d. She was admitted for IOL for T1DM.   ROM: 16h 63m with clear fluid GBS Status: Negative/-- (03/03 1057) Maximum Maternal Temperature: 100.60F  Labor Progress: . Initial SVE: 0.5/thick/ballotable. Patient received Cytotec, Foley balloon, Pitocin. SROM occurred. Received epidural. Labor course complicated by intermittent Cat II tracing (late decels) throughout induction. She then progressed to complete.   Delivery Date/Time: 3/17 @ [phone] Delivery: Micah Flesher to room for cervical exam and found to be complete +1/+2. Pushing commenced. Head delivered in ROA position. No nuchal cord present. 30 second shoulder dystocia resolved with McRoberts and suprapubic pressure. Body delivered in usual fashion. Infant with spontaneous cry, placed on mother's abdomen, dried and stimulated. Cord clamped x 2 after 1-minute delay, and cut by FOB. Cord gas drawn. Cord blood drawn. Placenta delivered spontaneously with gentle cord traction. Fundus firm with massage and Pitocin. Labia, perineum, vagina, and cervix inspected inspected with 1st degree perineal laceration which was repaired with 3-0 Vicryl in a standard fasion.  NICU present at delivery and complete assessment after 1 minute and then infant returned to mother's chest.  Baby Weight: 3504g Arterial pH: 7.125  Placenta: Sent to L&D Complications: 30 second shoulder dystocia Lacerations: 1st degree perineal EBL: 221 mL Analgesia: Epidural   Infant:  APGAR (1 MIN): 8   APGAR (5 MINS): 9   APGAR (10 MINS):     Jerilynn Birkenhead, MD OB Family Medicine Fellow, [facility] for [facility], [facility] 02/24/2020, 4:08 AM

## 2019-12-16 ENCOUNTER — Ambulatory Visit (HOSPITAL_COMMUNITY): Payer: Managed Care, Other (non HMO) | Admitting: *Deleted

## 2019-12-16 ENCOUNTER — Other Ambulatory Visit: Payer: Self-pay

## 2019-12-16 ENCOUNTER — Other Ambulatory Visit (HOSPITAL_COMMUNITY): Payer: Self-pay | Admitting: *Deleted

## 2019-12-16 ENCOUNTER — Ambulatory Visit (HOSPITAL_COMMUNITY)
Admission: RE | Admit: 2019-12-16 | Discharge: 2019-12-16 | Disposition: A | Discharge status: Home / Self Care | Payer: Managed Care, Other (non HMO) | Source: Ambulatory Visit | Attending: Obstetrics and Gynecology | Admitting: Obstetrics and Gynecology

## 2019-12-16 ENCOUNTER — Encounter (HOSPITAL_COMMUNITY): Payer: Self-pay

## 2019-12-16 VITALS — BP 133/62 | HR 71 | Temp 97.7°F

## 2019-12-16 DIAGNOSIS — O24313 Unspecified pre-existing diabetes mellitus in pregnancy, third trimester: Secondary | ICD-10-CM

## 2019-12-16 DIAGNOSIS — E063 Autoimmune thyroiditis: Secondary | ICD-10-CM | POA: Diagnosis present

## 2019-12-16 DIAGNOSIS — O24019 Pre-existing diabetes mellitus, type 1, in pregnancy, unspecified trimester: Secondary | ICD-10-CM | POA: Diagnosis present

## 2019-12-16 DIAGNOSIS — O0990 Supervision of high risk pregnancy, unspecified, unspecified trimester: Secondary | ICD-10-CM | POA: Insufficient documentation

## 2019-12-16 DIAGNOSIS — O24012 Pre-existing diabetes mellitus, type 1, in pregnancy, second trimester: Secondary | ICD-10-CM | POA: Diagnosis present

## 2019-12-16 DIAGNOSIS — Z3A28 28 weeks gestation of pregnancy: Secondary | ICD-10-CM

## 2019-12-16 DIAGNOSIS — O99283 Endocrine, nutritional and metabolic diseases complicating pregnancy, third trimester: Secondary | ICD-10-CM

## 2019-12-16 DIAGNOSIS — E079 Disorder of thyroid, unspecified: Secondary | ICD-10-CM

## 2019-12-16 DIAGNOSIS — O24414 Gestational diabetes mellitus in pregnancy, insulin controlled: Secondary | ICD-10-CM

## 2019-12-17 ENCOUNTER — Ambulatory Visit (INDEPENDENT_AMBULATORY_CARE_PROVIDER_SITE_OTHER): Admitting: Obstetrics and Gynecology

## 2019-12-17 VITALS — BP 121/78 | HR 99 | Wt 129.6 lb

## 2019-12-17 DIAGNOSIS — Z3A28 28 weeks gestation of pregnancy: Secondary | ICD-10-CM

## 2019-12-17 DIAGNOSIS — Z23 Encounter for immunization: Secondary | ICD-10-CM | POA: Diagnosis not present

## 2019-12-17 DIAGNOSIS — O0992 Supervision of high risk pregnancy, unspecified, second trimester: Secondary | ICD-10-CM

## 2019-12-17 DIAGNOSIS — O24013 Pre-existing diabetes mellitus, type 1, in pregnancy, third trimester: Secondary | ICD-10-CM

## 2019-12-17 DIAGNOSIS — E063 Autoimmune thyroiditis: Secondary | ICD-10-CM

## 2019-12-17 DIAGNOSIS — O24012 Pre-existing diabetes mellitus, type 1, in pregnancy, second trimester: Secondary | ICD-10-CM

## 2019-12-17 DIAGNOSIS — O0993 Supervision of high risk pregnancy, unspecified, third trimester: Secondary | ICD-10-CM

## 2019-12-17 NOTE — Patient Instructions (Signed)
Deciding about Circumcision in Baby Boys  (The Basics)  What is circumcision?  Circumcision is a surgery that removes the skin that covers the tip of the penis, called the "foreskin" Circumcision is usually done when a boy is between 18 and 27 days old. In the [place], circumcision is common. In some other countries, fewer boys are circumcised. Circumcision is a common tradition in some religions.  Should I have my baby boy circumcised?  There is no easy answer. Circumcision has some benefits. But it also has risks. After talking with your doctor, you will have to decide for yourself what is right for your family.  What are the benefits of circumcision?  Circumcised boys seem to have slightly lower rates of: ?Urinary tract infections ?Swelling of the opening at the tip of the penis Circumcised men seem to have slightly lower rates of: ?Urinary tract infections ?Swelling of the opening at the tip of the penis ?Penis cancer ?HIV and other infections that you catch during sex ?Cervical cancer in the women they have sex with Even so, in the [place], the risks of these problems are small - even in boys and men who have not been circumcised. Plus, boys and men who are not circumcised can reduce these extra risks by: ?Cleaning their penis well ?Using condoms during sex  What are the risks of circumcision?  Risks include: ?Bleeding or infection from the surgery ?Damage to or amputation of the penis ?A chance that the doctor will cut off too much or not enough of the foreskin ?A chance that sex won't feel as good later in life Only about 1 out of every 200 circumcisions leads to problems. There is also a chance that your health insurance won't pay for circumcision.  How is circumcision done in baby boys?  First, the baby gets medicine for pain relief. This might be a cream on the skin or a shot into the base of the penis. Next, the doctor cleans the baby's penis well. Then  he or she uses special tools to cut off the foreskin. Finally, the doctor wraps a bandage (called gauze) around the baby's penis. If you have your baby circumcised, his doctor or nurse will give you instructions on how to care for him after the surgery. It is important that you follow those instructions carefully.   Places to have your son circumcised:                                                                      [facility]     [phone] while you are in hospital         [facility]              [phone]   $269 by 4 wks                      [facility]                     [phone]   $269 by 7 days [facility]                    [phone]   $269 by 4 wks [facility]  [phone]   $225 by 2 wks    These prices sometimes change but are roughly what you can expect to pay. Please call and confirm pricing.   Circumcision is considered an elective/non-medically necessary procedure. There are many reasons parents decide to have their sons circumsized. During the first year of life circumcised males have a reduced risk of urinary tract infections but after this year the rates between circumcised males and uncircumcised males are the same.  It is safe to have your son circumcised outside of the hospital and the places above perform them regularly.   Deciding about Circumcision in Baby Boys  (Up-to-date The Basics)  What is circumcision?   Circumcision is a surgery that removes the skin that covers the tip of the penis, called the "foreskin" Circumcision is usually done when a boy is between 37 and 62 days old. In the [place], circumcision is common. In some other countries, fewer boys are circumcised. Circumcision is a common tradition in some religions.  Should I have my baby boy circumcised?   There is no easy answer. Circumcision has some benefits. But it also has risks. After talking with your doctor, you will have to decide for yourself what is right for your family.  What are  the benefits of circumcision?   Circumcised boys seem to have slightly lower rates of: ?Urinary tract infections ?Swelling of the opening at the tip of the penis Circumcised men seem to have slightly lower rates of: ?Urinary tract infections ?Swelling of the opening at the tip of the penis ?Penis cancer ?HIV and other infections that you catch during sex ?Cervical cancer in the women they have sex with Even so, in the [place], the risks of these problems are small - even in boys and men who have not been circumcised. Plus, boys and men who are not circumcised can reduce these extra risks by: ?Cleaning their penis well ?Using condoms during sex  What are the risks of circumcision?  Risks include: ?Bleeding or infection from the surgery ?Damage to or amputation of the penis ?A chance that the doctor will cut off too much or not enough of the foreskin ?A chance that sex won't feel as good later in life Only about 1 out of every 200 circumcisions leads to problems. There is also a chance that your health insurance won't pay for circumcision.  How is circumcision done in baby boys?  First, the baby gets medicine for pain relief. This might be a cream on the skin or a shot into the base of the penis. Next, the doctor cleans the baby's penis well. Then he or she uses special tools to cut off the foreskin. Finally, the doctor wraps a bandage (called gauze) around the baby's penis. If you have your baby circumcised, his doctor or nurse will give you instructions on how to care for him after the surgery. It is important that you follow those instructions carefully.

## 2019-12-17 NOTE — Progress Notes (Signed)
   PRENATAL VISIT NOTE  Subjective:  Nicole Carrillo is a 34 y.o. G1P0 at [redacted]w[redacted]d being seen today for ongoing prenatal care.  She is currently monitored for the following issues for this high-risk pregnancy and has Type 1 diabetes mellitus in pregnancy; Hashimoto's thyroiditis; and Supervision of high-risk pregnancy on their problem list.  Patient reports no complaints.  Contractions: Not present. Vag. Bleeding: None.  Movement: Present. Denies leaking of fluid.   The following portions of the patient's history were reviewed and updated as appropriate: allergies, current medications, past family history, past medical history, past social history, past surgical history and problem list.   Objective:   Vitals:   12/17/19 0825  BP: 121/78  Pulse: 99  Weight: 129 lb 9.6 oz (58.8 kg)    Fetal Status: Fetal Heart Rate (bpm): 142   Movement: Present     General:  Alert, oriented and cooperative. Patient is in no acute distress.  Skin: Skin is warm and dry. No rash noted.   Cardiovascular: Normal heart rate noted  Respiratory: Normal respiratory effort, no problems with respiration noted  Abdomen: Soft, gravid, appropriate for gestational age.  Pain/Pressure: Absent     Pelvic: Cervical exam deferred        Extremities: Normal range of motion.  Edema: None  Mental Status: Normal mood and affect. Normal behavior. Normal judgment and thought content.   Assessment and Plan:  Pregnancy: G1P0 at [redacted]w[redacted]d  1. Supervision of high risk pregnancy in second trimester  - Tdap vaccine greater than or equal to 7yo IM - CBC - RPR - HIV antibody - TSH - HIV   2. Type 1 diabetes mellitus during pregnancy in second trimester  Average fasting BS is 100 Continue ASA. Continue weekly visits with nutrition and [facility] endocrinology.    3. Hashimoto's thyroiditis  TSH and A1C today.     Preterm labor symptoms and general obstetric precautions including but not limited to vaginal bleeding,  contractions, leaking of fluid and fetal movement were reviewed in detail with the patient. Please refer to After Visit Summary for other counseling recommendations.   Return in about 2 weeks (around 12/31/2019) for MD ONLY, no apps- virtual visit .  Future Appointments  Date Time Provider Department Center  12/30/2019  3:35 PM Conan Bowens, MD [facility] WOC  01/13/2020  8:10 AM WH-MFC NURSE WH-MFC MFC-US  01/13/2020  8:15 AM WH-MFC [place] 4 WH-MFCUS MFC-US    Venia Carbon, NP

## 2019-12-18 LAB — CBC
Hematocrit: 32.2 % — ABNORMAL LOW (ref 34.0–46.6)
Hemoglobin: 11.2 g/dL (ref 11.1–15.9)
MCH: 32.4 pg (ref 26.6–33.0)
MCHC: 34.8 g/dL (ref 31.5–35.7)
MCV: 93 fL (ref 79–97)
Platelets: 350 10*3/uL (ref 150–450)
RBC: 3.46 x10E6/uL — ABNORMAL LOW (ref 3.77–5.28)
RDW: 13.1 % (ref 11.7–15.4)
WBC: 9 10*3/uL (ref 3.4–10.8)

## 2019-12-18 LAB — RPR: RPR Ser Ql: NONREACTIVE

## 2019-12-18 LAB — HIV ANTIBODY (ROUTINE TESTING W REFLEX): HIV Screen 4th Generation wRfx: NONREACTIVE

## 2019-12-18 LAB — HEMOGLOBIN A1C
Est. average glucose Bld gHb Est-mCnc: 143 mg/dL
Hgb A1c MFr Bld: 6.6 % — ABNORMAL HIGH (ref 4.8–5.6)

## 2019-12-18 LAB — TSH: TSH: 0.917 u[IU]/mL (ref 0.450–4.500)

## 2019-12-30 ENCOUNTER — Encounter: Payer: Self-pay | Admitting: Obstetrics and Gynecology

## 2019-12-30 ENCOUNTER — Telehealth (INDEPENDENT_AMBULATORY_CARE_PROVIDER_SITE_OTHER): Admitting: Obstetrics and Gynecology

## 2019-12-30 ENCOUNTER — Other Ambulatory Visit: Payer: Self-pay

## 2019-12-30 VITALS — BP 142/62 | HR 82

## 2019-12-30 DIAGNOSIS — E063 Autoimmune thyroiditis: Secondary | ICD-10-CM

## 2019-12-30 DIAGNOSIS — O0993 Supervision of high risk pregnancy, unspecified, third trimester: Secondary | ICD-10-CM

## 2019-12-30 DIAGNOSIS — O24013 Pre-existing diabetes mellitus, type 1, in pregnancy, third trimester: Secondary | ICD-10-CM

## 2019-12-30 NOTE — Progress Notes (Signed)
   TELEHEALTH OBSTETRICS PRENATAL VIRTUAL VIDEO VISIT ENCOUNTER NOTE  Provider location: Center for [employer] [facility] [address]   I connected with [name] on 12/30/19 at  3:35 PM EST by MyChart Video Encounter at home and verified that I am speaking with the correct person using two identifiers.   I discussed the limitations, risks, security and privacy concerns of performing an evaluation and management service virtually and the availability of in person appointments. I also discussed with the patient that there may be a patient responsible charge related to this service. The patient expressed understanding and agreed to proceed. Subjective:  Nicole Carrillo is a 34 y.o. G1P0 at [redacted]w[redacted]d being seen today for ongoing prenatal care.  She is currently monitored for the following issues for this high-risk pregnancy and has Type 1 diabetes mellitus in pregnancy; Hashimoto's thyroiditis; and Supervision of high-risk pregnancy on their problem list.  Patient reports occasional heart pounding when she lays down.  Contractions: Not present. Vag. Bleeding: None.  Movement: Present. Denies any leaking of fluid.   The following portions of the patient's history were reviewed and updated as appropriate: allergies, current medications, past family history, past medical history, past social history, past surgical history and problem list.   Objective:   Vitals:   12/30/19 1539  BP: (!) 142/62  Pulse: 82  135/69  Fetal Status:     Movement: Present     General:  Alert, oriented and cooperative. Patient is in no acute distress.  Respiratory: Normal respiratory effort, no problems with respiration noted  Mental Status: Normal mood and affect. Normal behavior. Normal judgment and thought content.  Rest of physical exam deferred due to type of encounter  Imaging:   Assessment and Plan:  Pregnancy: G1P0 at [redacted]w[redacted]d  1. Type 1 diabetes mellitus during pregnancy in third trimester Delivery at 37-39  weeks per MFM On insulin pump managed by endocrine at [facility] Reports CBGs have been  To start weekly testing at 32 weeks  2. Hashimoto's thyroiditis Last TSH wnl  3. Supervision of high risk pregnancy in third trimester Reviewed hospital policies regarding visitors, masking, COVID testing, etc.  Preterm labor symptoms and general obstetric precautions including but not limited to vaginal bleeding, contractions, leaking of fluid and fetal movement were reviewed in detail with the patient. I discussed the assessment and treatment plan with the patient. The patient was provided an opportunity to ask questions and all were answered. The patient agreed with the plan and demonstrated an understanding of the instructions. The patient was advised to call back or seek an in-person office evaluation/go to MAU at [facility] for any urgent or concerning symptoms. Please refer to After Visit Summary for other counseling recommendations.   I provided 20 minutes of face-to-face time during this encounter.  Return in about 2 weeks (around 01/13/2020) for virtual, high OB.  Future Appointments  Date Time Provider Department Center  01/13/2020  8:10 AM WH-MFC NURSE WH-MFC MFC-US  01/13/2020  8:15 AM WH-MFC [place] 4 WH-MFCUS MFC-US    Conan Bowens, MD Center for [facility], [facility]

## 2019-12-30 NOTE — Progress Notes (Signed)
I connected with  Nicole Carrillo on 12/30/19 at 1531 by telephone and verified that I am speaking with the correct person using two identifiers. Pt given directions to log into MyChart.   I discussed the limitations, risks, security and privacy concerns of performing an evaluation and management service by telephone and the availability of in person appointments. I also discussed with the patient that there may be a patient responsible charge related to this service. The patient expressed understanding and agreed to proceed.  Marjo Bicker, RN 12/30/2019  3:31 PM

## 2020-01-06 ENCOUNTER — Telehealth (INDEPENDENT_AMBULATORY_CARE_PROVIDER_SITE_OTHER): Admitting: Obstetrics and Gynecology

## 2020-01-06 DIAGNOSIS — O0993 Supervision of high risk pregnancy, unspecified, third trimester: Secondary | ICD-10-CM

## 2020-01-06 MED ORDER — TERCONAZOLE 0.4 % VA CREA: 1.0000 | TOPICAL_CREAM | Freq: Every day | VAGINAL | 0 refills | Status: DC

## 2020-01-06 NOTE — Addendum Note (Signed)
Addended by: Ed Blalock on: 01/06/2020 03:52 PM   Modules accepted: Orders

## 2020-01-06 NOTE — Telephone Encounter (Addendum)
Called pt and she reports she has thick clumpy light white/green/yellowish discharge. She reports it is the same as when she was diagnosed with yeast the last time.  She reports no burning or itching. She reports no odor.   Reviewed taking the Terazol for 3 nights and if discharge continues she will need to come in for a vaginal swab. Pt voiced understanding.

## 2020-01-06 NOTE — Telephone Encounter (Signed)
Patient is requesting a call back. She thinks she has a yeast infection. She just wants something called in because she gets them all the time.

## 2020-01-13 ENCOUNTER — Other Ambulatory Visit: Payer: Self-pay

## 2020-01-13 ENCOUNTER — Other Ambulatory Visit (HOSPITAL_COMMUNITY): Payer: Self-pay | Admitting: *Deleted

## 2020-01-13 ENCOUNTER — Ambulatory Visit (HOSPITAL_COMMUNITY): Payer: Managed Care, Other (non HMO) | Admitting: *Deleted

## 2020-01-13 ENCOUNTER — Encounter (HOSPITAL_COMMUNITY): Payer: Self-pay

## 2020-01-13 ENCOUNTER — Ambulatory Visit (HOSPITAL_COMMUNITY)
Admission: RE | Admit: 2020-01-13 | Discharge: 2020-01-13 | Disposition: A | Discharge status: Home / Self Care | Payer: Managed Care, Other (non HMO) | Source: Ambulatory Visit | Attending: Obstetrics | Admitting: Obstetrics

## 2020-01-13 VITALS — BP 134/72 | HR 92 | Temp 97.4°F

## 2020-01-13 DIAGNOSIS — O24019 Pre-existing diabetes mellitus, type 1, in pregnancy, unspecified trimester: Secondary | ICD-10-CM

## 2020-01-13 DIAGNOSIS — O358XX0 Maternal care for other (suspected) fetal abnormality and damage, not applicable or unspecified: Secondary | ICD-10-CM | POA: Insufficient documentation

## 2020-01-13 DIAGNOSIS — O24313 Unspecified pre-existing diabetes mellitus in pregnancy, third trimester: Secondary | ICD-10-CM | POA: Diagnosis not present

## 2020-01-13 DIAGNOSIS — O24319 Unspecified pre-existing diabetes mellitus in pregnancy, unspecified trimester: Secondary | ICD-10-CM

## 2020-01-13 DIAGNOSIS — E079 Disorder of thyroid, unspecified: Secondary | ICD-10-CM | POA: Diagnosis not present

## 2020-01-13 DIAGNOSIS — Z794 Long term (current) use of insulin: Secondary | ICD-10-CM | POA: Diagnosis present

## 2020-01-13 DIAGNOSIS — Z362 Encounter for other antenatal screening follow-up: Secondary | ICD-10-CM | POA: Diagnosis not present

## 2020-01-13 DIAGNOSIS — O359XX0 Maternal care for (suspected) fetal abnormality and damage, unspecified, not applicable or unspecified: Secondary | ICD-10-CM

## 2020-01-13 DIAGNOSIS — O24414 Gestational diabetes mellitus in pregnancy, insulin controlled: Secondary | ICD-10-CM

## 2020-01-13 DIAGNOSIS — Z3A32 32 weeks gestation of pregnancy: Secondary | ICD-10-CM

## 2020-01-13 DIAGNOSIS — O99283 Endocrine, nutritional and metabolic diseases complicating pregnancy, third trimester: Secondary | ICD-10-CM | POA: Diagnosis not present

## 2020-01-13 DIAGNOSIS — O35BXX0 Maternal care for other (suspected) fetal abnormality and damage, fetal cardiac anomalies, not applicable or unspecified: Secondary | ICD-10-CM | POA: Insufficient documentation

## 2020-01-13 NOTE — Consult Note (Signed)
Maternal-Fetal Medicine  Name: Nicole Carrillo CLE:[id number] Requesting Provider: Leroy Libman, MD  Patient with type 1 diabetes returned for fetal growth assessment and antenatal testing. She is on continuous subcutaneous insulin infusion ("pump") and reports her fasting and postprandial levels are within normal range. Recent HbA1C is 6.6%.  Her blood pressures have been normal at prenatal visits. BP today at our office is 134/72 mm Hg. She takes low-dose aspirin prophylaxis.  Patient also has a diagnosis of Hashimoto's disease and her recent TSH level is within normal range (0.917).  On today's ultrasound, amniotic fluid is normal good fetal activity seen.  Fetal growth is appropriate for gestational age.  Antenatal testing is reassuring.  BPP 8/8.  Small circumscribed hyper echogenic mass is seen in the left lung.  Measures 1.7 x 1.2 x 1.6 cm.  No evidence of pleural effusion or hydrops.  No mediastinal shift is seen.  The mass is consistent with the diagnosis of congenital pulmonary airway malformation. Four-chamber view of the heart appears normal.  It is difficult to ascertain its blood supply, but it is unlikely to be bronchopulmonary sequestration.  Congenital pulmonary airway malformation ratio (CVR) is 0.06.  On reviewing the images of previous visit, I could not identify the lung mass.  I counseled the patient on the following: Congenital pulmonary airway malformation or CPAM (aka CCAM): -It has cystic and adenomatous elements.  Possibly arising from an artist and bronchopulmonary development early in gestational age.  It is probably microcystic (type III). -This condition is usually benign and is not associated with increased risk of chromosomal anomalies. -CVR is small and suggests good prognosis.  The likelihood of development of pleural effusion or hydrops is low. -I Informed the patient that in some cases it can resolve spontaneously before or after birth. -I also informed  her that ultrasound has limitations and our antenatal diagnosis may not be supported by postnatal imaging. -We will continue to look for evidence of hydrops during weekly scans. -No early delivery is indicated and the patient can have vaginal delivery. -Cardiac anatomy evaluated at previous scan appears normal. I am not recommending fetal echocardiography now.  Type 1 diabetes: -We briefly discussed the importance of good control of diabetes to prevent adverse outcomes. -Patient is educated and well-motivated and understands the importance of good control of diabetes. Recommendations: -Weekly BPP till delivery. -Will evaluate lung mass and for development of hydrops at every visit. -Delivery at 39 weeks provided diabetes is well controlled.  Consultation including face-to-face counseling: 30 minutes.

## 2020-01-14 ENCOUNTER — Telehealth (INDEPENDENT_AMBULATORY_CARE_PROVIDER_SITE_OTHER): Admitting: Obstetrics and Gynecology

## 2020-01-14 ENCOUNTER — Encounter: Payer: Self-pay | Admitting: Obstetrics and Gynecology

## 2020-01-14 VITALS — BP 126/75 | HR 90

## 2020-01-14 DIAGNOSIS — O24013 Pre-existing diabetes mellitus, type 1, in pregnancy, third trimester: Secondary | ICD-10-CM

## 2020-01-14 DIAGNOSIS — O0993 Supervision of high risk pregnancy, unspecified, third trimester: Secondary | ICD-10-CM

## 2020-01-14 DIAGNOSIS — Z3A32 32 weeks gestation of pregnancy: Secondary | ICD-10-CM

## 2020-01-14 DIAGNOSIS — Q330 Congenital cystic lung: Secondary | ICD-10-CM

## 2020-01-14 DIAGNOSIS — K921 Melena: Secondary | ICD-10-CM

## 2020-01-14 DIAGNOSIS — E063 Autoimmune thyroiditis: Secondary | ICD-10-CM

## 2020-01-14 DIAGNOSIS — O99893 Other specified diseases and conditions complicating puerperium: Secondary | ICD-10-CM

## 2020-01-14 NOTE — Progress Notes (Signed)
TELEHEALTH OBSTETRICS PRENATAL VIRTUAL VIDEO VISIT ENCOUNTER NOTE  Provider location: [facility] for [employer] at [place]   I connected with [name] on 01/14/20 at  9:35 AM EST by MyChart Video Encounter at home and verified that I am speaking with the correct person using two identifiers.   I discussed the limitations, risks, security and privacy concerns of performing an evaluation and management service virtually and the availability of in person appointments. I also discussed with the patient that there may be a patient responsible charge related to this service. The patient expressed understanding and agreed to proceed. Subjective:  Nicole Carrillo is a 34 y.o. G1P0 at [redacted]w[redacted]d being seen today for ongoing prenatal care.  She is currently monitored for the following issues for this high-risk pregnancy and has Type 1 diabetes mellitus in pregnancy; Hashimoto's thyroiditis; Supervision of high-risk pregnancy; and Congenital pulmonary airway malformation (CPAM) on their problem list.  Patient reports some blood stools. Concerned about a few things. Is concerned she has hemorrhoids. Has had some bloody bowel movements.    Contractions: Not present. Vag. Bleeding: None.  Movement: Present. Denies any leaking of fluid.   The following portions of the patient's history were reviewed and updated as appropriate: allergies, current medications, past family history, past medical history, past social history, past surgical history and problem list.   Objective:   Vitals:   01/14/20 0942  BP: 126/75  Pulse: 90    Fetal Status:     Movement: Present     General:  Alert, oriented and cooperative. Patient is in no acute distress.  Respiratory: Normal respiratory effort, no problems with respiration noted  Mental Status: Normal mood and affect. Normal behavior. Normal judgment and thought content.  Rest of physical exam deferred due to type of encounter  Imaging:  Assessment and  Plan:  Pregnancy: G1P0 at [redacted]w[redacted]d  1. Congenital pulmonary airway malformation (CPAM) To f/u with MFM next week Good prognosis  2. Type 1 diabetes mellitus during pregnancy in third trimester On insulin pump, reports CBGs are fairly well controlled - follows with Endocrine at [facility]  3. Hashimoto's thyroiditis - Last TSH wnl  4. Supervision of high risk pregnancy in third trimester  5. Bloody stool Blood on outside of stool, having hard BMs Start colace, metamucil, increase hydration Refer to GI if continues after pregnancy   Preterm labor symptoms and general obstetric precautions including but not limited to vaginal bleeding, contractions, leaking of fluid and fetal movement were reviewed in detail with the patient. I discussed the assessment and treatment plan with the patient. The patient was provided an opportunity to ask questions and all were answered. The patient agreed with the plan and demonstrated an understanding of the instructions. The patient was advised to call back or seek an in-person office evaluation/go to MAU at [facility] for any urgent or concerning symptoms. Please refer to After Visit Summary for other counseling recommendations.   I provided 20 minutes of face-to-face time during this encounter.  Return in about 2 weeks (around 01/28/2020) for virtual, high OB.  Future Appointments  Date Time Provider Department Center  01/20/2020  9:00 AM WH-MFC NURSE WH-MFC MFC-US  01/20/2020  9:00 AM WH-MFC [place] 3 WH-MFCUS MFC-US  01/27/2020  8:45 AM WH-MFC NURSE WH-MFC MFC-US  01/27/2020  8:45 AM WH-MFC [place] 2 WH-MFCUS MFC-US  02/03/2020  9:45 AM WH-MFC NURSE WH-MFC MFC-US  02/03/2020  9:45 AM WH-MFC [place] 5 WH-MFCUS MFC-US  02/10/2020  8:15 AM  [place] NURSE [place] MFC-US  02/10/2020  8:15 AM [place], [place] for [facility], [place]

## 2020-01-14 NOTE — Progress Notes (Signed)
I connected with  [name] on 01/14/20 at 0941 by MyChart and verified that I am speaking with the correct person using two identifiers.   I discussed the limitations, risks, security and privacy concerns of performing an evaluation and management service by telephone and the availability of in person appointments. I also discussed with the patient that there may be a patient responsible charge related to this service. The patient expressed understanding and agreed to proceed.  Marjo Bicker, RN 01/14/2020  9:41 AM

## 2020-01-20 ENCOUNTER — Other Ambulatory Visit: Payer: Self-pay

## 2020-01-20 ENCOUNTER — Ambulatory Visit (HOSPITAL_COMMUNITY)
Admission: RE | Admit: 2020-01-20 | Discharge: 2020-01-20 | Disposition: A | Discharge status: Home / Self Care | Payer: Managed Care, Other (non HMO) | Source: Ambulatory Visit | Attending: Obstetrics and Gynecology | Admitting: Obstetrics and Gynecology

## 2020-01-20 ENCOUNTER — Ambulatory Visit (HOSPITAL_COMMUNITY): Payer: Managed Care, Other (non HMO) | Admitting: *Deleted

## 2020-01-20 ENCOUNTER — Encounter (HOSPITAL_COMMUNITY): Payer: Self-pay

## 2020-01-20 VITALS — BP 129/80 | HR 100 | Temp 97.5°F

## 2020-01-20 DIAGNOSIS — O0990 Supervision of high risk pregnancy, unspecified, unspecified trimester: Secondary | ICD-10-CM | POA: Diagnosis present

## 2020-01-20 DIAGNOSIS — O99283 Endocrine, nutritional and metabolic diseases complicating pregnancy, third trimester: Secondary | ICD-10-CM | POA: Diagnosis not present

## 2020-01-20 DIAGNOSIS — Z362 Encounter for other antenatal screening follow-up: Secondary | ICD-10-CM

## 2020-01-20 DIAGNOSIS — O359XX0 Maternal care for (suspected) fetal abnormality and damage, unspecified, not applicable or unspecified: Secondary | ICD-10-CM | POA: Insufficient documentation

## 2020-01-20 DIAGNOSIS — E079 Disorder of thyroid, unspecified: Secondary | ICD-10-CM | POA: Diagnosis not present

## 2020-01-20 DIAGNOSIS — Z3A33 33 weeks gestation of pregnancy: Secondary | ICD-10-CM

## 2020-01-20 DIAGNOSIS — O24313 Unspecified pre-existing diabetes mellitus in pregnancy, third trimester: Secondary | ICD-10-CM | POA: Diagnosis not present

## 2020-01-27 ENCOUNTER — Ambulatory Visit (HOSPITAL_COMMUNITY): Payer: Managed Care, Other (non HMO) | Admitting: *Deleted

## 2020-01-27 ENCOUNTER — Other Ambulatory Visit: Payer: Self-pay

## 2020-01-27 ENCOUNTER — Ambulatory Visit (HOSPITAL_COMMUNITY)
Admission: RE | Admit: 2020-01-27 | Discharge: 2020-01-27 | Disposition: A | Discharge status: Home / Self Care | Payer: Managed Care, Other (non HMO) | Source: Ambulatory Visit | Attending: Obstetrics and Gynecology | Admitting: Obstetrics and Gynecology

## 2020-01-27 ENCOUNTER — Encounter (HOSPITAL_COMMUNITY): Payer: Self-pay

## 2020-01-27 VITALS — BP 130/78 | HR 89 | Temp 97.5°F

## 2020-01-27 DIAGNOSIS — Z3A34 34 weeks gestation of pregnancy: Secondary | ICD-10-CM | POA: Diagnosis not present

## 2020-01-27 DIAGNOSIS — O0990 Supervision of high risk pregnancy, unspecified, unspecified trimester: Secondary | ICD-10-CM | POA: Diagnosis present

## 2020-01-27 DIAGNOSIS — O24313 Unspecified pre-existing diabetes mellitus in pregnancy, third trimester: Secondary | ICD-10-CM | POA: Diagnosis not present

## 2020-01-27 DIAGNOSIS — O359XX0 Maternal care for (suspected) fetal abnormality and damage, unspecified, not applicable or unspecified: Secondary | ICD-10-CM | POA: Insufficient documentation

## 2020-01-29 ENCOUNTER — Other Ambulatory Visit: Payer: Self-pay

## 2020-01-29 ENCOUNTER — Telehealth (INDEPENDENT_AMBULATORY_CARE_PROVIDER_SITE_OTHER): Payer: Managed Care, Other (non HMO) | Admitting: Obstetrics & Gynecology

## 2020-01-29 VITALS — BP 127/78 | HR 90 | Wt 131.5 lb

## 2020-01-29 DIAGNOSIS — O0993 Supervision of high risk pregnancy, unspecified, third trimester: Secondary | ICD-10-CM

## 2020-01-29 DIAGNOSIS — E063 Autoimmune thyroiditis: Secondary | ICD-10-CM

## 2020-01-29 DIAGNOSIS — Z3A34 34 weeks gestation of pregnancy: Secondary | ICD-10-CM

## 2020-01-29 DIAGNOSIS — O24013 Pre-existing diabetes mellitus, type 1, in pregnancy, third trimester: Secondary | ICD-10-CM

## 2020-01-29 NOTE — Progress Notes (Signed)
   TELEHEALTH VIRTUAL OBSTETRICS VISIT ENCOUNTER NOTE  I connected with Nicole Carrillo on 01/29/20 at  9:35 AM EST by telephone at home and verified that I am speaking with the correct person using two identifiers.   I discussed the limitations, risks, security and privacy concerns of performing an evaluation and management service by telephone and the availability of in person appointments. I also discussed with the patient that there may be a patient responsible charge related to this service. The patient expressed understanding and agreed to proceed.  Subjective:  Nicole Carrillo is a 34 y.o. G1P0 at [redacted]w[redacted]d being followed for ongoing prenatal care.  She is currently monitored for the following issues for this high-risk pregnancy and has Type 1 diabetes mellitus in pregnancy; Hashimoto's thyroiditis; Supervision of high-risk pregnancy; and Congenital pulmonary airway malformation (CPAM) on their problem list.  Patient reports occasional contractions. Reports fetal movement. Denies any contractions, bleeding or leaking of fluid.   The following portions of the patient's history were reviewed and updated as appropriate: allergies, current medications, past family history, past medical history, past social history, past surgical history and problem list.   Objective:   General:  Alert, oriented and cooperative.   Mental Status: Normal mood and affect perceived. Normal judgment and thought content.  Rest of physical exam deferred due to type of encounter  Assessment and Plan:  Pregnancy: G1P0 at [redacted]w[redacted]d 1. Supervision of high risk pregnancy in third trimester Discussed dating, delivery by 39 weeks  2. Type 1 diabetes mellitus during pregnancy in third trimester States good control with pump,followed by endocrine  3. Hashimoto's thyroiditis Nl TSH  Preterm labor symptoms and general obstetric precautions including but not limited to vaginal bleeding, contractions, leaking of fluid and fetal  movement were reviewed in detail with the patient.  I discussed the assessment and treatment plan with the patient. The patient was provided an opportunity to ask questions and all were answered. The patient agreed with the plan and demonstrated an understanding of the instructions. The patient was advised to call back or seek an in-person office evaluation/go to MAU at [facility] for any urgent or concerning symptoms. Please refer to After Visit Summary for other counseling recommendations.   I provided 12 minutes of non-face-to-face time during this encounter.  Return in about 2 weeks (around 02/12/2020) for GBS.  Future Appointments  Date Time Provider Department Center  02/03/2020  9:45 AM WH-MFC NURSE WH-MFC MFC-US  02/03/2020  9:45 AM WH-MFC [place] 5 WH-MFCUS MFC-US  02/10/2020  8:15 AM WH-MFC NURSE WH-MFC MFC-US  02/10/2020  8:15 AM WH-MFC [place] 4 WH-MFCUS MFC-US  02/10/2020  9:15 AM Hermina Staggers, MD [facility] WOC    Scheryl Darter, MD Center for [facility], [facility]

## 2020-01-29 NOTE — Progress Notes (Signed)
Pts glucose is on Meter.

## 2020-01-29 NOTE — Progress Notes (Signed)
I connected with  [name] on 01/29/20 at  9:35 AM EST by telephone and verified that I am speaking with the correct person using two identifiers.   I discussed the limitations, risks, security and privacy concerns of performing an evaluation and management service by telephone and the availability of in person appointments. I also discussed with the patient that there may be a patient responsible charge related to this service. The patient expressed understanding and agreed to proceed.  Henrietta Dine, CMA 01/29/2020  9:12 AM

## 2020-01-29 NOTE — Patient Instructions (Signed)

## 2020-02-03 ENCOUNTER — Other Ambulatory Visit (HOSPITAL_COMMUNITY): Payer: Self-pay | Admitting: *Deleted

## 2020-02-03 ENCOUNTER — Ambulatory Visit (HOSPITAL_COMMUNITY): Payer: Managed Care, Other (non HMO) | Admitting: *Deleted

## 2020-02-03 ENCOUNTER — Ambulatory Visit (HOSPITAL_COMMUNITY)
Admission: RE | Admit: 2020-02-03 | Discharge: 2020-02-03 | Disposition: A | Discharge status: Home / Self Care | Payer: Managed Care, Other (non HMO) | Source: Ambulatory Visit | Attending: Obstetrics and Gynecology | Admitting: Obstetrics and Gynecology

## 2020-02-03 ENCOUNTER — Other Ambulatory Visit: Payer: Self-pay

## 2020-02-03 ENCOUNTER — Encounter (HOSPITAL_COMMUNITY): Payer: Self-pay

## 2020-02-03 VITALS — BP 131/82 | HR 84 | Temp 97.6°F

## 2020-02-03 DIAGNOSIS — O359XX0 Maternal care for (suspected) fetal abnormality and damage, unspecified, not applicable or unspecified: Secondary | ICD-10-CM | POA: Diagnosis not present

## 2020-02-03 DIAGNOSIS — O24313 Unspecified pre-existing diabetes mellitus in pregnancy, third trimester: Secondary | ICD-10-CM | POA: Diagnosis not present

## 2020-02-03 DIAGNOSIS — O0990 Supervision of high risk pregnancy, unspecified, unspecified trimester: Secondary | ICD-10-CM | POA: Diagnosis present

## 2020-02-03 DIAGNOSIS — Z3A35 35 weeks gestation of pregnancy: Secondary | ICD-10-CM

## 2020-02-03 DIAGNOSIS — O24319 Unspecified pre-existing diabetes mellitus in pregnancy, unspecified trimester: Secondary | ICD-10-CM

## 2020-02-03 DIAGNOSIS — Z794 Long term (current) use of insulin: Secondary | ICD-10-CM

## 2020-02-10 ENCOUNTER — Encounter (HOSPITAL_COMMUNITY): Payer: Self-pay

## 2020-02-10 ENCOUNTER — Ambulatory Visit (HOSPITAL_COMMUNITY)
Admission: RE | Admit: 2020-02-10 | Discharge: 2020-02-10 | Disposition: A | Discharge status: Home / Self Care | Payer: Managed Care, Other (non HMO) | Source: Ambulatory Visit | Attending: Obstetrics and Gynecology | Admitting: Obstetrics and Gynecology

## 2020-02-10 ENCOUNTER — Ambulatory Visit (HOSPITAL_COMMUNITY): Payer: Managed Care, Other (non HMO) | Admitting: *Deleted

## 2020-02-10 ENCOUNTER — Encounter: Payer: Self-pay | Admitting: Obstetrics and Gynecology

## 2020-02-10 ENCOUNTER — Other Ambulatory Visit: Payer: Self-pay

## 2020-02-10 ENCOUNTER — Ambulatory Visit (INDEPENDENT_AMBULATORY_CARE_PROVIDER_SITE_OTHER): Payer: Managed Care, Other (non HMO) | Admitting: Obstetrics and Gynecology

## 2020-02-10 VITALS — BP 132/86 | HR 64 | Wt 141.0 lb

## 2020-02-10 VITALS — BP 139/71 | HR 67 | Temp 97.8°F

## 2020-02-10 DIAGNOSIS — O99283 Endocrine, nutritional and metabolic diseases complicating pregnancy, third trimester: Secondary | ICD-10-CM | POA: Diagnosis not present

## 2020-02-10 DIAGNOSIS — Z3A36 36 weeks gestation of pregnancy: Secondary | ICD-10-CM

## 2020-02-10 DIAGNOSIS — O359XX0 Maternal care for (suspected) fetal abnormality and damage, unspecified, not applicable or unspecified: Secondary | ICD-10-CM

## 2020-02-10 DIAGNOSIS — O0990 Supervision of high risk pregnancy, unspecified, unspecified trimester: Secondary | ICD-10-CM | POA: Insufficient documentation

## 2020-02-10 DIAGNOSIS — E079 Disorder of thyroid, unspecified: Secondary | ICD-10-CM | POA: Diagnosis not present

## 2020-02-10 DIAGNOSIS — Z113 Encounter for screening for infections with a predominantly sexual mode of transmission: Secondary | ICD-10-CM

## 2020-02-10 DIAGNOSIS — E063 Autoimmune thyroiditis: Secondary | ICD-10-CM

## 2020-02-10 DIAGNOSIS — Q330 Congenital cystic lung: Secondary | ICD-10-CM

## 2020-02-10 DIAGNOSIS — O0993 Supervision of high risk pregnancy, unspecified, third trimester: Secondary | ICD-10-CM

## 2020-02-10 DIAGNOSIS — O24013 Pre-existing diabetes mellitus, type 1, in pregnancy, third trimester: Secondary | ICD-10-CM

## 2020-02-10 LAB — POCT URINALYSIS DIP (DEVICE)
Bilirubin Urine: NEGATIVE
Glucose, UA: 100 mg/dL — AB
Hgb urine dipstick: NEGATIVE
Ketones, ur: NEGATIVE mg/dL
Nitrite: NEGATIVE
Protein, ur: NEGATIVE mg/dL
Specific Gravity, Urine: 1.015 (ref 1.005–1.030)
Urobilinogen, UA: 0.2 mg/dL (ref 0.0–1.0)
pH: 7 (ref 5.0–8.0)

## 2020-02-10 NOTE — Progress Notes (Signed)
Subjective:  Nicole Carrillo is a 34 y.o. G1P0 at [redacted]w[redacted]d being seen today for ongoing prenatal care.  She is currently monitored for the following issues for this high-risk pregnancy and has Type 1 diabetes mellitus in pregnancy; Hashimoto's thyroiditis; Supervision of high-risk pregnancy; and Congenital pulmonary airway malformation (CPAM) on their problem list.  Patient reports general discomforts of pregnancy.  Contractions: Not present. Vag. Bleeding: None.  Movement: Present. Denies leaking of fluid.   The following portions of the patient's history were reviewed and updated as appropriate: allergies, current medications, past family history, past medical history, past social history, past surgical history and problem list. Problem list updated.  Objective:   Vitals:   02/10/20 0933  BP: 132/86  Pulse: 64  Weight: 141 lb (64 kg)    Fetal Status: Fetal Heart Rate (bpm): 137   Movement: Present     General:  Alert, oriented and cooperative. Patient is in no acute distress.  Skin: Skin is warm and dry. No rash noted.   Cardiovascular: Normal heart rate noted  Respiratory: Normal respiratory effort, no problems with respiration noted  Abdomen: Soft, gravid, appropriate for gestational age. Pain/Pressure: Present     Pelvic:  Cervical exam performed        Extremities: Normal range of motion.  Edema: Mild pitting, slight indentation  Mental Status: Normal mood and affect. Normal behavior. Normal judgment and thought content.   Urinalysis:      Assessment and Plan:  Pregnancy: G1P0 at [redacted]w[redacted]d  1. Supervision of high risk pregnancy in third trimester Stable - GC/Chlamydia probe amp (Black)not at [facility] - Culture, beta strep (group b only)  2. Type 1 diabetes mellitus during pregnancy in third trimester Followed by Endocrine. Pt reports CBG's in goal range BPP and growth scan today  3. Hashimoto's thyroiditis Stable  4. Congenital pulmonary airway malformation  (CPAM) Growth scan today  Term labor symptoms and general obstetric precautions including but not limited to vaginal bleeding, contractions, leaking of fluid and fetal movement were reviewed in detail with the patient. Please refer to After Visit Summary for other counseling recommendations.  Return in about 1 week (around 02/17/2020) for OB visit, video.   Hermina Staggers, MD

## 2020-02-10 NOTE — Patient Instructions (Signed)
Third Trimester of Pregnancy The third trimester is from week 28 through week 40 (months 7 through 9). The third trimester is a time when the unborn baby (fetus) is growing rapidly. At the end of the ninth month, the fetus is about 20 inches in length and weighs 6-10 pounds. Body changes during your third trimester Your body will continue to go through many changes during pregnancy. The changes vary from woman to woman. During the third trimester:  Your weight will continue to increase. You can expect to gain 25-35 pounds (11-16 kg) by the end of the pregnancy.  You may begin to get stretch marks on your hips, abdomen, and breasts.  You may urinate more often because the fetus is moving lower into your pelvis and pressing on your bladder.  You may develop or continue to have heartburn. This is caused by increased hormones that slow down muscles in the digestive tract.  You may develop or continue to have constipation because increased hormones slow digestion and cause the muscles that push waste through your intestines to relax.  You may develop hemorrhoids. These are swollen veins (varicose veins) in the rectum that can itch or be painful.  You may develop swollen, bulging veins (varicose veins) in your legs.  You may have increased body aches in the pelvis, back, or thighs. This is due to weight gain and increased hormones that are relaxing your joints.  You may have changes in your hair. These can include thickening of your hair, rapid growth, and changes in texture. Some women also have hair loss during or after pregnancy, or hair that feels dry or thin. Your hair will most likely return to normal after your baby is born.  Your breasts will continue to grow and they will continue to become tender. A yellow fluid (colostrum) may leak from your breasts. This is the first milk you are producing for your baby.  Your belly button may stick out.  You may notice more swelling in your hands,  face, or ankles.  You may have increased tingling or numbness in your hands, arms, and legs. The skin on your belly may also feel numb.  You may feel short of breath because of your expanding uterus.  You may have more problems sleeping. This can be caused by the size of your belly, increased need to urinate, and an increase in your body's metabolism.  You may notice the fetus "dropping," or moving lower in your abdomen (lightening).  You may have increased vaginal discharge.  You may notice your joints feel loose and you may have pain around your pelvic bone. What to expect at prenatal visits You will have prenatal exams every 2 weeks until week 36. Then you will have weekly prenatal exams. During a routine prenatal visit:  You will be weighed to make sure you and the baby are growing normally.  Your blood pressure will be taken.  Your abdomen will be measured to track your baby's growth.  The fetal heartbeat will be listened to.  Any test results from the previous visit will be discussed.  You may have a cervical check near your due date to see if your cervix has softened or thinned (effaced).  You will be tested for Group B streptococcus. This happens between 35 and 37 weeks. Your health care provider may ask you:  What your birth plan is.  How you are feeling.  If you are feeling the baby move.  If you have had any abnormal   symptoms, such as leaking fluid, bleeding, severe headaches, or abdominal cramping.  If you are using any tobacco products, including cigarettes, chewing tobacco, and electronic cigarettes.  If you have any questions. Other tests or screenings that may be performed during your third trimester include:  Blood tests that check for low iron levels (anemia).  Fetal testing to check the health, activity level, and growth of the fetus. Testing is done if you have certain medical conditions or if there are problems during the pregnancy.  Nonstress test  (NST). This test checks the health of your baby to make sure there are no signs of problems, such as the baby not getting enough oxygen. During this test, a belt is placed around your belly. The baby is made to move, and its heart rate is monitored during movement. What is false labor? False labor is a condition in which you feel small, irregular tightenings of the muscles in the womb (contractions) that usually go away with rest, changing position, or drinking water. These are called Braxton Hicks contractions. Contractions may last for hours, days, or even weeks before true labor sets in. If contractions come at regular intervals, become more frequent, increase in intensity, or become painful, you should see your health care provider. What are the signs of labor?  Abdominal cramps.  Regular contractions that start at 10 minutes apart and become stronger and more frequent with time.  Contractions that start on the top of the uterus and spread down to the lower abdomen and back.  Increased pelvic pressure and dull back pain.  A watery or bloody mucus discharge that comes from the vagina.  Leaking of amniotic fluid. This is also known as your "water breaking." It could be a slow trickle or a gush. Let your health care provider know if it has a color or strange odor. If you have any of these signs, call your health care provider right away, even if it is before your due date. Follow these instructions at home: Medicines  Follow your health care provider's instructions regarding medicine use. Specific medicines may be either safe or unsafe to take during pregnancy.  Take a prenatal vitamin that contains at least 600 micrograms (mcg) of folic acid.  If you develop constipation, try taking a stool softener if your health care provider approves. Eating and drinking   Eat a balanced diet that includes fresh fruits and vegetables, whole grains, good sources of protein such as meat, eggs, or tofu,  and low-fat dairy. Your health care provider will help you determine the amount of weight gain that is right for you.  Avoid raw meat and uncooked cheese. These carry germs that can cause birth defects in the baby.  If you have low calcium intake from food, talk to your health care provider about whether you should take a daily calcium supplement.  Eat four or five small meals rather than three large meals a day.  Limit foods that are high in fat and processed sugars, such as fried and sweet foods.  To prevent constipation: ? Drink enough fluid to keep your urine clear or pale yellow. ? Eat foods that are high in fiber, such as fresh fruits and vegetables, whole grains, and beans. Activity  Exercise only as directed by your health care provider. Most women can continue their usual exercise routine during pregnancy. Try to exercise for 30 minutes at least 5 days a week. Stop exercising if you experience uterine contractions.  Avoid heavy lifting.  Do   not exercise in extreme heat or humidity, or at high altitudes.  Wear low-heel, comfortable shoes.  Practice good posture.  You may continue to have sex unless your health care provider tells you otherwise. Relieving pain and discomfort  Take frequent breaks and rest with your legs elevated if you have leg cramps or low back pain.  Take warm sitz baths to soothe any pain or discomfort caused by hemorrhoids. Use hemorrhoid cream if your health care provider approves.  Wear a good support bra to prevent discomfort from breast tenderness.  If you develop varicose veins: ? Wear support pantyhose or compression stockings as told by your healthcare provider. ? Elevate your feet for 15 minutes, 3-4 times a day. Prenatal care  Write down your questions. Take them to your prenatal visits.  Keep all your prenatal visits as told by your health care provider. This is important. Safety  Wear your seat belt at all times when driving.  Make  a list of emergency phone numbers, including numbers for family, friends, the hospital, and police and fire departments. General instructions  Avoid cat litter boxes and soil used by cats. These carry germs that can cause birth defects in the baby. If you have a cat, ask someone to clean the litter box for you.  Do not travel far distances unless it is absolutely necessary and only with the approval of your health care provider.  Do not use hot tubs, steam rooms, or saunas.  Do not drink alcohol.  Do not use any products that contain nicotine or tobacco, such as cigarettes and e-cigarettes. If you need help quitting, ask your health care provider.  Do not use any medicinal herbs or unprescribed drugs. These chemicals affect the formation and growth of the baby.  Do not douche or use tampons or scented sanitary pads.  Do not cross your legs for long periods of time.  To prepare for the arrival of your baby: ? Take prenatal classes to understand, practice, and ask questions about labor and delivery. ? Make a trial run to the hospital. ? Visit the hospital and tour the maternity area. ? Arrange for maternity or paternity leave through employers. ? Arrange for family and friends to take care of pets while you are in the hospital. ? Purchase a rear-facing car seat and make sure you know how to install it in your car. ? Pack your hospital bag. ? Prepare the baby's nursery. Make sure to remove all pillows and stuffed animals from the baby's crib to prevent suffocation.  Visit your dentist if you have not gone during your pregnancy. Use a soft toothbrush to brush your teeth and be gentle when you floss. Contact a health care provider if:  You are unsure if you are in labor or if your water has broken.  You become dizzy.  You have mild pelvic cramps, pelvic pressure, or nagging pain in your abdominal area.  You have lower back pain.  You have persistent nausea, vomiting, or  diarrhea.  You have an unusual or bad smelling vaginal discharge.  You have pain when you urinate. Get help right away if:  Your water breaks before 37 weeks.  You have regular contractions less than 5 minutes apart before 37 weeks.  You have a fever.  You are leaking fluid from your vagina.  You have spotting or bleeding from your vagina.  You have severe abdominal pain or cramping.  You have rapid weight loss or weight gain.  You have   shortness of breath with chest pain.  You notice sudden or extreme swelling of your face, hands, ankles, feet, or legs.  Your baby makes fewer than 10 movements in 2 hours.  You have severe headaches that do not go away when you take medicine.  You have vision changes. Summary  The third trimester is from week 28 through week 40, months 7 through 9. The third trimester is a time when the unborn baby (fetus) is growing rapidly.  During the third trimester, your discomfort may increase as you and your baby continue to gain weight. You may have abdominal, leg, and back pain, sleeping problems, and an increased need to urinate.  During the third trimester your breasts will keep growing and they will continue to become tender. A yellow fluid (colostrum) may leak from your breasts. This is the first milk you are producing for your baby.  False labor is a condition in which you feel small, irregular tightenings of the muscles in the womb (contractions) that eventually go away. These are called Braxton Hicks contractions. Contractions may last for hours, days, or even weeks before true labor sets in.  Signs of labor can include: abdominal cramps; regular contractions that start at 10 minutes apart and become stronger and more frequent with time; watery or bloody mucus discharge that comes from the vagina; increased pelvic pressure and dull back pain; and leaking of amniotic fluid. This information is not intended to replace advice given to you by your  health care provider. Make sure you discuss any questions you have with your health care provider. Document Revised: 03/19/2019 Document Reviewed: 01/01/2017 [employer] Patient Education  2020 [employer]  

## 2020-02-11 LAB — GC/CHLAMYDIA PROBE AMP (~~LOC~~) NOT AT ARMC
Chlamydia: NEGATIVE
Comment: NEGATIVE
Comment: NORMAL
Neisseria Gonorrhea: NEGATIVE

## 2020-02-14 LAB — CULTURE, BETA STREP (GROUP B ONLY): Strep Gp B Culture: NEGATIVE

## 2020-02-17 ENCOUNTER — Inpatient Hospital Stay (HOSPITAL_COMMUNITY)
Admission: AD | Admit: 2020-02-17 | Discharge: 2020-02-17 | Disposition: A | Discharge status: Home / Self Care | Payer: Managed Care, Other (non HMO) | Attending: Obstetrics and Gynecology | Admitting: Obstetrics and Gynecology

## 2020-02-17 ENCOUNTER — Other Ambulatory Visit (HOSPITAL_COMMUNITY): Payer: Self-pay | Admitting: *Deleted

## 2020-02-17 ENCOUNTER — Other Ambulatory Visit (HOSPITAL_COMMUNITY): Payer: Self-pay | Admitting: Obstetrics

## 2020-02-17 ENCOUNTER — Ambulatory Visit (HOSPITAL_BASED_OUTPATIENT_CLINIC_OR_DEPARTMENT_OTHER): Payer: Managed Care, Other (non HMO) | Admitting: Obstetrics and Gynecology

## 2020-02-17 ENCOUNTER — Other Ambulatory Visit (HOSPITAL_COMMUNITY): Payer: Self-pay

## 2020-02-17 ENCOUNTER — Ambulatory Visit (HOSPITAL_BASED_OUTPATIENT_CLINIC_OR_DEPARTMENT_OTHER)
Admission: RE | Admit: 2020-02-17 | Discharge: 2020-02-17 | Disposition: A | Discharge status: Home / Self Care | Payer: Managed Care, Other (non HMO) | Source: Ambulatory Visit | Attending: Obstetrics and Gynecology | Admitting: Obstetrics and Gynecology

## 2020-02-17 ENCOUNTER — Encounter (HOSPITAL_COMMUNITY): Payer: Self-pay | Admitting: Obstetrics and Gynecology

## 2020-02-17 ENCOUNTER — Other Ambulatory Visit: Payer: Self-pay

## 2020-02-17 ENCOUNTER — Ambulatory Visit (INDEPENDENT_AMBULATORY_CARE_PROVIDER_SITE_OTHER): Payer: Managed Care, Other (non HMO) | Admitting: Obstetrics and Gynecology

## 2020-02-17 ENCOUNTER — Encounter (HOSPITAL_COMMUNITY): Payer: Self-pay

## 2020-02-17 ENCOUNTER — Other Ambulatory Visit: Payer: Self-pay | Admitting: Advanced Practice Midwife

## 2020-02-17 ENCOUNTER — Ambulatory Visit (HOSPITAL_COMMUNITY): Payer: Managed Care, Other (non HMO) | Admitting: *Deleted

## 2020-02-17 VITALS — BP 141/86 | HR 71 | Wt 144.0 lb

## 2020-02-17 VITALS — BP 127/75 | HR 86 | Temp 97.4°F

## 2020-02-17 DIAGNOSIS — E079 Disorder of thyroid, unspecified: Secondary | ICD-10-CM

## 2020-02-17 DIAGNOSIS — Z794 Long term (current) use of insulin: Secondary | ICD-10-CM

## 2020-02-17 DIAGNOSIS — R030 Elevated blood-pressure reading, without diagnosis of hypertension: Secondary | ICD-10-CM | POA: Diagnosis present

## 2020-02-17 DIAGNOSIS — O99280 Endocrine, nutritional and metabolic diseases complicating pregnancy, unspecified trimester: Secondary | ICD-10-CM | POA: Diagnosis not present

## 2020-02-17 DIAGNOSIS — O133 Gestational [pregnancy-induced] hypertension without significant proteinuria, third trimester: Secondary | ICD-10-CM | POA: Diagnosis not present

## 2020-02-17 DIAGNOSIS — O24319 Unspecified pre-existing diabetes mellitus in pregnancy, unspecified trimester: Secondary | ICD-10-CM

## 2020-02-17 DIAGNOSIS — O24013 Pre-existing diabetes mellitus, type 1, in pregnancy, third trimester: Secondary | ICD-10-CM

## 2020-02-17 DIAGNOSIS — Z3A37 37 weeks gestation of pregnancy: Secondary | ICD-10-CM | POA: Diagnosis not present

## 2020-02-17 DIAGNOSIS — O359XX0 Maternal care for (suspected) fetal abnormality and damage, unspecified, not applicable or unspecified: Secondary | ICD-10-CM | POA: Diagnosis not present

## 2020-02-17 DIAGNOSIS — O26893 Other specified pregnancy related conditions, third trimester: Secondary | ICD-10-CM | POA: Insufficient documentation

## 2020-02-17 DIAGNOSIS — Z7982 Long term (current) use of aspirin: Secondary | ICD-10-CM | POA: Diagnosis not present

## 2020-02-17 DIAGNOSIS — O358XX0 Maternal care for other (suspected) fetal abnormality and damage, not applicable or unspecified: Secondary | ICD-10-CM

## 2020-02-17 DIAGNOSIS — O24019 Pre-existing diabetes mellitus, type 1, in pregnancy, unspecified trimester: Secondary | ICD-10-CM

## 2020-02-17 DIAGNOSIS — Z3689 Encounter for other specified antenatal screening: Secondary | ICD-10-CM

## 2020-02-17 DIAGNOSIS — O35BXX0 Maternal care for other (suspected) fetal abnormality and damage, fetal cardiac anomalies, not applicable or unspecified: Secondary | ICD-10-CM

## 2020-02-17 DIAGNOSIS — E063 Autoimmune thyroiditis: Secondary | ICD-10-CM

## 2020-02-17 DIAGNOSIS — O24313 Unspecified pre-existing diabetes mellitus in pregnancy, third trimester: Secondary | ICD-10-CM

## 2020-02-17 DIAGNOSIS — O0993 Supervision of high risk pregnancy, unspecified, third trimester: Secondary | ICD-10-CM

## 2020-02-17 LAB — COMPREHENSIVE METABOLIC PANEL
ALT: 15 U/L (ref 0–44)
AST: 21 U/L (ref 15–41)
Albumin: 3 g/dL — ABNORMAL LOW (ref 3.5–5.0)
Alkaline Phosphatase: 75 U/L (ref 38–126)
Anion gap: 10 (ref 5–15)
BUN: 8 mg/dL (ref 6–20)
CO2: 21 mmol/L — ABNORMAL LOW (ref 22–32)
Calcium: 9.2 mg/dL (ref 8.9–10.3)
Chloride: 107 mmol/L (ref 98–111)
Creatinine, Ser: 0.85 mg/dL (ref 0.44–1.00)
GFR calc Af Amer: >60 mL/min (ref >60)
GFR calc non Af Amer: >60 mL/min (ref >60)
Glucose, Bld: 92 mg/dL (ref 70–99)
Potassium: 4 mmol/L (ref 3.5–5.1)
Sodium: 138 mmol/L (ref 135–145)
Total Bilirubin: 0.8 mg/dL (ref 0.3–1.2)
Total Protein: 6.5 g/dL (ref 6.5–8.1)

## 2020-02-17 LAB — URINALYSIS, ROUTINE W REFLEX MICROSCOPIC
Bacteria, UA: NONE SEEN
Bilirubin Urine: NEGATIVE
Glucose, UA: NEGATIVE mg/dL
Hgb urine dipstick: NEGATIVE
Ketones, ur: NEGATIVE mg/dL
Nitrite: NEGATIVE
Protein, ur: NEGATIVE mg/dL
Specific Gravity, Urine: 1.015 (ref 1.005–1.030)
pH: 6 (ref 5.0–8.0)

## 2020-02-17 LAB — CBC
HCT: 36.2 % (ref 36.0–46.0)
Hemoglobin: 12.3 g/dL (ref 12.0–15.0)
MCH: 31.4 pg (ref 26.0–34.0)
MCHC: 34 g/dL (ref 30.0–36.0)
MCV: 92.3 fL (ref 80.0–100.0)
Platelets: 286 10*3/uL (ref 150–400)
RBC: 3.92 MIL/uL (ref 3.87–5.11)
RDW: 13.5 % (ref 11.5–15.5)
WBC: 6.9 10*3/uL (ref 4.0–10.5)
nRBC: 0 % (ref 0.0–0.2)

## 2020-02-17 LAB — PROTEIN / CREATININE RATIO, URINE
Creatinine, Urine: 161.18 mg/dL
Protein Creatinine Ratio: 0.15 mg/mg{Cre} (ref 0.00–0.15)
Total Protein, Urine: 24 mg/dL

## 2020-02-17 NOTE — Discharge Instructions (Signed)

## 2020-02-17 NOTE — Progress Notes (Signed)
   Induction Assessment Scheduling Form: [facility]ax to [facility]:  [phone]  Nicole Carrillo                                                                                   DOB:  11-Sep-1986                                                            MRN:  [id number]                                                                     Phone #:   [phone]                         Provider:  Ninfa Meeker  GP:  [id number]                                                            Estimated Date of Delivery: 03/08/20  Dating Criteria: LMP    Medical Indications for induction:  T1DM-Controlled Admission Date/Time:  March 16th at [facility] Gestational age on admission:  7 weeks   Filed Weights   02/17/20 1155  Weight: 63 kg   HIV:  Non Reactive (01/07 0912) GBS: Negative/-- (03/03 1057)  Bishop score TBD   Method of induction(proposed):  Outpatient Foley Bulb   Scheduling Provider Signature:  Cherre Robins, CNM                                            Today's Date:  02/17/2020

## 2020-02-17 NOTE — Procedures (Signed)
Nicole Carrillo August 26, 1986 [redacted]w[redacted]d  Fetus A Non-Stress Test Interpretation for 02/17/20  Indication: Unsatisfactory BPP  Fetal Heart Rate A Mode: External Baseline Rate (A): 145 bpm Variability: Moderate Accelerations: 15 x 15 Decelerations: None Multiple birth?: No  Uterine Activity Mode: Palpation, Toco Contraction Frequency (min): none Resting Tone Palpated: Relaxed Resting Time: Adequate  Interpretation (Fetal Testing) Nonstress Test Interpretation: Reactive Comments: Reviewed tracing with Dr. Judeth Cornfield

## 2020-02-17 NOTE — MAU Provider Note (Signed)
History     CSN: [id number]  Arrival date and time: 02/17/20 1121   First Provider Initiated Contact with Patient 02/17/20 1216      Chief Complaint  Patient presents with  . Hypertension   Nicole Carrillo is a 34 y.o. G1P0 at [redacted]w[redacted]d who receives care at [facility].  She presents today for Hypertension.  She was seen in the office today and noted to have elevated blood pressures.  However, no elevated blood pressures since arrival. Patient denies HA and epigastric pain.  She does reports some "flashy vision," but contributes this to her vitreous detachment.  She endorses fetal movement and denies abdominal cramping or perception of contractions.  Patient understands that IOL may be recommended today depending upon labs and/or blood pressures.      OB History    Gravida  1   Para      Term      Preterm      AB      Living  0     SAB      TAB      Ectopic      Multiple      Live Births              Past Medical History:  Diagnosis Date  . Asthma   . Diabetes mellitus type 1 (HCC)   . GERD (gastroesophageal reflux disease)   . Hashimoto's thyroiditis   . Insulin pump in place   . Orthodontics    permanent upper and lower retainers  . PVD (posterior vitreous detachment), right   . Wears contact lenses     Past Surgical History:  Procedure Laterality Date  . DENTAL SURGERY    . ENDOSCOPIC CONCHA BULLOSA RESECTION Bilateral 07/24/2018   Procedure: ENDOSCOPIC CONCHA BULLOSA MIDDLE TURBINATE;  Surgeon: Vernie Murders, MD;  Location: [facility];  Service: ENT;  Laterality: Bilateral;  Diabetic - insulin pump  . SEPTOPLASTY N/A 07/24/2018   Procedure: SEPTOPLASTY;  Surgeon: Vernie Murders, MD;  Location: [facility];  Service: ENT;  Laterality: N/A;  NEEDS TO BE FIRST INSULIN PUMP  . TURBINATE REDUCTION Bilateral 07/24/2018   Procedure: INFERIOR TURBINATE REDUCTION;  Surgeon: Vernie Murders, MD;  Location: [facility];  Service: ENT;   Laterality: Bilateral;    History reviewed. No pertinent family history.  Social History   Tobacco Use  . Smoking status: Never Smoker  . Smokeless tobacco: Never Used  Substance Use Topics  . Alcohol use: Not Currently    Alcohol/week: 1.0 standard drinks    Types: 1 Glasses of wine per week  . Drug use: Not Currently    Allergies:  Allergies  Allergen Reactions  . Crab [Shellfish Allergy] Shortness Of Breath    Scallops cause rash.  Shrimp and lobster - no problems.  . Acetaminophen     Effects blood sugars and insulin pump.  . Pneumococcal Vaccines     Local injection swelling  . Soy Allergy     Blood test  . Adhesive [Tape] Rash    With extended exposure  . Peanut-Containing Drug Products Rash    Medications Prior to Admission  Medication Sig Dispense Refill Last Dose  . albuterol (PROVENTIL HFA;VENTOLIN HFA) 108 (90 Base) MCG/ACT inhaler Inhale into the lungs every 6 (six) hours as needed for wheezing or shortness of breath.     Marland Kitchen aspirin EC 81 MG tablet Take 81 mg by mouth daily.     . Cetirizine HCl (ZYRTEC PO)  Take by mouth.     Tery Sanfilippo Sodium (COLACE PO) Take by mouth.     . fexofenadine (ALLEGRA) 180 MG tablet Take 180 mg by mouth daily.     . Insulin Human (INSULIN PUMP) SOLN Inject into the skin.     Marland Kitchen insulin lispro (HUMALOG) 100 UNIT/ML injection Inject into the skin. For insulin pump     . montelukast (SINGULAIR) 10 MG tablet Take 10 mg by mouth daily.     . NON FORMULARY Continuous glucose monitor     . Prenatal MV-Min-Fe Fum-FA-DHA (PRENATAL+DHA PO) Take by mouth.     . Psyllium (METAMUCIL PO) Take by mouth.       Review of Systems Physical Exam   Blood pressure 136/82, pulse 76, temperature 98.4 F (36.9 C), temperature source Oral, resp. rate 16, height 5\' 2"  (1.575 m), weight 63 kg, last menstrual period 06/02/2019, SpO2 99 %. Vitals:   02/17/20 1215 02/17/20 1216 02/17/20 1220 02/17/20 1225  BP:  136/86    Pulse:  76    Resp:       Temp:      TempSrc:      SpO2: 100%  99% 99%  Weight:      Height:        Physical Exam  Fetal Assessment 135 bpm, Mod Var, -Decels, +10x10Accels Toco: None graphed or ctx.  MAU Course   Results for orders placed or performed during the hospital encounter of 02/17/20 (from the past 24 hour(s))  CBC     Status: None   Collection Time: 02/17/20 12:30 PM  Result Value Ref Range   WBC 6.9 4.0 - 10.5 K/uL   RBC 3.92 3.87 - 5.11 MIL/uL   Hemoglobin 12.3 12.0 - 15.0 g/dL   HCT 04/18/20 27.2 - 53.6 %   MCV 92.3 80.0 - 100.0 fL   MCH 31.4 26.0 - 34.0 pg   MCHC 34.0 30.0 - 36.0 g/dL   RDW 64.4 03.4 - 74.2 %   Platelets 286 150 - 400 K/uL   nRBC 0.0 0.0 - 0.2 %  Comprehensive metabolic panel     Status: Abnormal   Collection Time: 02/17/20 12:30 PM  Result Value Ref Range   Sodium 138 135 - 145 mmol/L   Potassium 4.0 3.5 - 5.1 mmol/L   Chloride 107 98 - 111 mmol/L   CO2 21 (L) 22 - 32 mmol/L   Glucose, Bld 92 70 - 99 mg/dL   BUN 8 6 - 20 mg/dL   Creatinine, Ser 04/18/20 0.44 - 1.00 mg/dL   Calcium 9.2 8.9 - 6.38 mg/dL   Total Protein 6.5 6.5 - 8.1 g/dL   Albumin 3.0 (L) 3.5 - 5.0 g/dL   AST 21 15 - 41 U/L   ALT 15 0 - 44 U/L   Alkaline Phosphatase 75 38 - 126 U/L   Total Bilirubin 0.8 0.3 - 1.2 mg/dL   GFR calc non Af Amer >60 >60 mL/min   GFR calc Af Amer >60 >60 mL/min   Anion gap 10 5 - 15  Protein / creatinine ratio, urine     Status: None   Collection Time: 02/17/20 12:50 PM  Result Value Ref Range   Creatinine, Urine 161.18 mg/dL   Total Protein, Urine 24 mg/dL   Protein Creatinine Ratio 0.15 0.00 - 0.15 mg/mg[Cre]  Urinalysis, Routine w reflex microscopic     Status: Abnormal   Collection Time: 02/17/20 12:55 PM  Result Value Ref Range   Color,  Urine YELLOW YELLOW   APPearance CLEAR CLEAR   Specific Gravity, Urine 1.015 1.005 - 1.030   pH 6.0 5.0 - 8.0   Glucose, UA NEGATIVE NEGATIVE mg/dL   Hgb urine dipstick NEGATIVE NEGATIVE   Bilirubin Urine NEGATIVE NEGATIVE    Ketones, ur NEGATIVE NEGATIVE mg/dL   Protein, ur NEGATIVE NEGATIVE mg/dL   Nitrite NEGATIVE NEGATIVE   Leukocytes,Ua TRACE (A) NEGATIVE   RBC / HPF 0-5 0 - 5 RBC/hpf   WBC, UA 0-5 0 - 5 WBC/hpf   Bacteria, UA NONE SEEN NONE SEEN   Squamous Epithelial / LPF 0-5 0 - 5   Mucus PRESENT    [place] MFM FETAL BPP W/NONSTRESS  Result Date: 02/17/2020 ----------------------------------------------------------------------  OBSTETRICS REPORT                       (Signed Final 02/17/2020 10:35 am) ---------------------------------------------------------------------- Patient Info  ID #:       [id number]                          D.O.B.:  Jan 31, 1986 (33 yrs)  Name:       Nicole Carrillo                 Visit Date: 02/17/2020 08:26 am ---------------------------------------------------------------------- Performed By  Performed By:     Berlinda Last          Ref. Address:     [address]                                                             [address]  Attending:        Tama High MD        [facility]  Referred By:      [facility] ---------------------------------------------------------------------- Orders   #  Description                          Code         Ordered By   1  [place] MFM FETAL BPP                     78295.6      Peterson Ao      W/NONSTRESS  ----------------------------------------------------------------------   #  Order #                    Accession #                 Episode #   1  [id number]                  [id number]  [id number]  ---------------------------------------------------------------------- Indications   Fetal abnormality - other known or             O35.9XX0   suspected (chest mass)   [redacted] weeks gestation of pregnancy                Z3A.37   Diabetes - Pregestational,3rd trimester -      O24.313   insulin pump   Thyroid disease in pregnancy                    O99.280, E07.9   Transferred care - moved to [facility]  ---------------------------------------------------------------------- Fetal Evaluation  Num Of Fetuses:         1  Fetal Heart Rate(bpm):  140  Cardiac Activity:       Observed  Presentation:           Cephalic  Placenta:               Anterior  Amniotic Fluid  AFI FV:      Within normal limits  AFI Sum(cm)     %Tile       Largest Pocket(cm)  10.97           32          4.47  RUQ(cm)       RLQ(cm)       LUQ(cm)        LLQ(cm)  4.47          2.2           2.36           1.94  Comment:    CPAM measures 2.2x 2.6x 1.8cm today, CVR 0.17 ---------------------------------------------------------------------- Biophysical Evaluation  Amniotic F.V:   Within normal limits       F. Tone:        Observed  F. Movement:    Observed                   N.S.T:          Reactive  F. Breathing:   Not Observed               Score:          8/10 ---------------------------------------------------------------------- Biometry  HC:       323   mm     G. Age:  36w 4d          8  % ---------------------------------------------------------------------- OB History  Gravidity:    1 ---------------------------------------------------------------------- Gestational Age  Clinical EDD:  37w 1d                                        EDD:   03/08/20  U/S Today:     36w 4d                                        EDD:   03/12/20  Best:          37w 5d     Det. By:  U/S  (12/16/19)          EDD:   03/04/20 ---------------------------------------------------------------------- Anatomy  Stomach:               Appears normal, left   Bladder:  Appears normal                         sided ---------------------------------------------------------------------- Impression  Patient with type 1 diabetes (on insulin pump) and fetal  congenital airway malformation (CPAM) returned for  antenatal testing.  She reports that her fasting levels are over 95 mg/dL and  postprandial levels are around 150 mg/dL.  She is being  followed by her endocrinologist.  Amniotic fluid is normal and good fetal activity is seen. Left  CPAM is seen again and measures 2.2 x 2.6 x 1.8 cm (CV  Ratio 0.17 indicating good prognosis). No evidence of fetal  hydrops. Fetal breathing movements did not meet the criteria  of BPP. NST is reactive. BPP 8/10.  I discussed the importance of good blood glucose control.  Suboptimal control is associated with fetal adverse outcomes  including stillbirth.  Timing of delivery is between 37 and 39 weeks. Given her  blood glucose control is suboptimal, I recommend delivery at  37 to 38 weeks.  Patient wants to be delivered at 38 weeks. ---------------------------------------------------------------------- Recommendations  -BPP and NST on 02/19/20.  -Delivery at 38 weeks. ----------------------------------------------------------------------                  Noralee Spaceavi Shankar, MD Electronically Signed Final Report   02/17/2020 10:35 am ----------------------------------------------------------------------   MDM Physical Exam Labs: CBC, CMP, PC Ratio Measure BPQ15 min EFM Assessment and Plan  34 year old G1P0  SIUP at 37.1weeks Cat I FT BPP/NST 8/10 in office Elevated BP  -BPP reviewed, off for breathing.  -POC reviewed. -Exam performed and findings discussed. -Reiterated possible recommendation for IOL today. -Patient questions screening protocols for family members and provider addresses. -Will await results and reassess as needed. -No further questions or concerns.   Cherre RobinsJessica L Keontre Defino MSN, CNM 02/17/2020, 12:16 PM    Reassessment (2:23 PM)  -PIH labs return normal. -BP remain normotensive. -NST remains reactive.  -[facility]  Ivonne Andrew-V. Smith, CNM verifies that patient is candidate for outpatient foley. Message sent to [facility] office for potential scheduling on Tuesday afternoon. -Provider in room to discuss results and POC with patient.   -Patient initially  expresses desire to change dates due to husband being out of town. -Instructed to call husband and discuss then she can talk to office staff on Friday for modification if absolutely necessary. -Patient agreeable. -Discussed placement of outpatient foley.  Given verbal and written information.  Informed that if she could not be seen in the office on Tuesday for placement, she would return to MAU. -Patient without further questions or concerns. -Encouraged to call or return to MAU if symptoms worsen or with the onset of new symptoms. -Discharged to home in stable condition.  Cherre RobinsJessica L Roderica Cathell MSN, CNM Advanced Practice Provider, [facility]

## 2020-02-17 NOTE — MAU Note (Signed)
Nicole Carrillo is a 34 y.o. at [redacted]w[redacted]d here in MAU reporting: pt sent over from the office for elevated BP. Denies headache, visual changes, and RUQ. No bleeding, LOF, or contractions. +FM  Onset of complaint: today  Pain score: 0/10  Vitals:   02/17/20 1155  BP: 133/88  Pulse: 79  Resp: 16  Temp: 98.4 F (36.9 C)  SpO2: 98%     FHT: +FM  Lab orders placed from triage: UA

## 2020-02-17 NOTE — Progress Notes (Signed)
Prenatal Visit Note Date: 02/17/2020 Clinic: [facility]  Subjective:  Nicole Carrillo is a 34 y.o. G1P0 at [redacted]w[redacted]d being seen today for ongoing prenatal care.  She is currently monitored for the following issues for this high-risk pregnancy and has Type 1 diabetes mellitus in pregnancy; Hashimoto's thyroiditis; Supervision of high-risk pregnancy; and Fetal CPAM on their problem list.  Patient reports having some anxiety.   Contractions: Not present. Vag. Bleeding: None.  Movement: Present. Denies leaking of fluid.   The following portions of the patient's history were reviewed and updated as appropriate: allergies, current medications, past family history, past medical history, past social history, past surgical history and problem list. Problem list updated.  Objective:   Vitals:   02/17/20 0952 02/17/20 1018  BP: (!) 148/89 (!) 141/86  Pulse: 71   Weight: 144 lb (65.3 kg)     Fetal Status:     Movement: Present     General:  Alert, oriented and cooperative. Patient is in no acute distress.  Skin: Skin is warm and dry. No rash noted.   Cardiovascular: Normal heart rate noted  Respiratory: Normal respiratory effort, no problems with respiration noted  Abdomen: Soft, gravid, appropriate for gestational age. Pain/Pressure: Absent     Pelvic:  Cervical exam deferred        Extremities: Normal range of motion.  Edema: Trace  Mental Status: Normal mood and affect. Normal behavior. Normal judgment and thought content.   Urinalysis:      Assessment and Plan:  Pregnancy: G1P0 at [redacted]w[redacted]d  1. Transient hypertension No s/s of pre-eclampsia on ROS. D/w her and recommend MAU evaluation today. Pt amenable to this and aware that given she's 37wks that she may be recommended to stay for IOL and MAU aware.   2. Type 1 diabetes mellitus during pregnancy in third trimester Followed by endocrine (see care everywhere)  3. Supervision of high risk pregnancy in third  trimester Per MFM, bpp 8/10 (breathing) and recommends 38wks IOL and come back on Friday for rpt bpp. CPAM today still small at 1-2cm and shouldn't impact newborn; just need to tell peds on L&D admit  4. Hashimoto's thyroiditis Doing well on no meds.   5. Anomaly of heart of fetus affecting pregnancy, antepartum, single or unspecified fetus See above  Term labor symptoms and general obstetric precautions including but not limited to vaginal bleeding, contractions, leaking of fluid and fetal movement were reviewed in detail with the patient. Please refer to After Visit Summary for other counseling recommendations.  Return in about 2 days (around 02/19/2020).   Sebastian Bing, MD

## 2020-02-18 ENCOUNTER — Telehealth (HOSPITAL_COMMUNITY): Payer: Self-pay | Admitting: *Deleted

## 2020-02-18 NOTE — Telephone Encounter (Signed)
Preadmission screen  

## 2020-02-19 ENCOUNTER — Ambulatory Visit (HOSPITAL_COMMUNITY)
Admission: RE | Admit: 2020-02-19 | Discharge: 2020-02-19 | Disposition: A | Discharge status: Home / Self Care | Payer: Managed Care, Other (non HMO) | Source: Ambulatory Visit | Attending: Obstetrics and Gynecology | Admitting: Obstetrics and Gynecology

## 2020-02-19 ENCOUNTER — Encounter (HOSPITAL_COMMUNITY): Payer: Self-pay

## 2020-02-19 ENCOUNTER — Ambulatory Visit (HOSPITAL_COMMUNITY): Payer: Managed Care, Other (non HMO) | Admitting: *Deleted

## 2020-02-19 ENCOUNTER — Ambulatory Visit (HOSPITAL_COMMUNITY): Payer: Managed Care, Other (non HMO)

## 2020-02-19 ENCOUNTER — Ambulatory Visit (INDEPENDENT_AMBULATORY_CARE_PROVIDER_SITE_OTHER): Payer: Managed Care, Other (non HMO) | Admitting: Obstetrics and Gynecology

## 2020-02-19 ENCOUNTER — Other Ambulatory Visit: Payer: Self-pay

## 2020-02-19 VITALS — BP 134/84 | HR 91 | Wt 143.7 lb

## 2020-02-19 VITALS — BP 141/86 | HR 69 | Temp 97.6°F

## 2020-02-19 DIAGNOSIS — Z794 Long term (current) use of insulin: Secondary | ICD-10-CM | POA: Diagnosis present

## 2020-02-19 DIAGNOSIS — O24313 Unspecified pre-existing diabetes mellitus in pregnancy, third trimester: Secondary | ICD-10-CM | POA: Diagnosis not present

## 2020-02-19 DIAGNOSIS — O359XX0 Maternal care for (suspected) fetal abnormality and damage, unspecified, not applicable or unspecified: Secondary | ICD-10-CM

## 2020-02-19 DIAGNOSIS — O24319 Unspecified pre-existing diabetes mellitus in pregnancy, unspecified trimester: Secondary | ICD-10-CM | POA: Diagnosis not present

## 2020-02-19 DIAGNOSIS — E079 Disorder of thyroid, unspecified: Secondary | ICD-10-CM

## 2020-02-19 DIAGNOSIS — O358XX0 Maternal care for other (suspected) fetal abnormality and damage, not applicable or unspecified: Secondary | ICD-10-CM

## 2020-02-19 DIAGNOSIS — O0990 Supervision of high risk pregnancy, unspecified, unspecified trimester: Secondary | ICD-10-CM | POA: Insufficient documentation

## 2020-02-19 DIAGNOSIS — O99283 Endocrine, nutritional and metabolic diseases complicating pregnancy, third trimester: Secondary | ICD-10-CM

## 2020-02-19 DIAGNOSIS — Z3A37 37 weeks gestation of pregnancy: Secondary | ICD-10-CM

## 2020-02-19 DIAGNOSIS — Z3A38 38 weeks gestation of pregnancy: Secondary | ICD-10-CM

## 2020-02-19 DIAGNOSIS — O0993 Supervision of high risk pregnancy, unspecified, third trimester: Secondary | ICD-10-CM

## 2020-02-19 DIAGNOSIS — E063 Autoimmune thyroiditis: Secondary | ICD-10-CM

## 2020-02-19 DIAGNOSIS — O24013 Pre-existing diabetes mellitus, type 1, in pregnancy, third trimester: Secondary | ICD-10-CM

## 2020-02-19 DIAGNOSIS — O35BXX0 Maternal care for other (suspected) fetal abnormality and damage, fetal cardiac anomalies, not applicable or unspecified: Secondary | ICD-10-CM

## 2020-02-19 NOTE — Progress Notes (Signed)
Prenatal Visit Note Date: 02/19/2020 Clinic: [facility]  Subjective:  Nicole Carrillo is a 34 y.o. G1P0 at [redacted]w[redacted]d being seen today for ongoing prenatal care.  She is currently monitored for the following issues for this high-risk pregnancy and has Type 1 diabetes mellitus in pregnancy; Hashimoto's thyroiditis; Supervision of high-risk pregnancy; and Fetal CPAM on their problem list.  Patient reports no complaints.   Contractions: Not present. Vag. Bleeding: None.  Movement: Present. Denies leaking of fluid.   The following portions of the patient's history were reviewed and updated as appropriate: allergies, current medications, past family history, past medical history, past social history, past surgical history and problem list. Problem list updated.  Objective:   Vitals:   02/19/20 0833  BP: 134/84  Pulse: 91  Weight: 143 lb 11.2 oz (65.2 kg)    Fetal Status: Fetal Heart Rate (bpm): 143   Movement: Present     General:  Alert, oriented and cooperative. Patient is in no acute distress.  Skin: Skin is warm and dry. No rash noted.   Cardiovascular: Normal heart rate noted  Respiratory: Normal respiratory effort, no problems with respiration noted  Abdomen: Soft, gravid, appropriate for gestational age. Pain/Pressure: Absent     Pelvic:  Cervical exam deferred        Extremities: Normal range of motion.  Edema: Trace  Mental Status: Normal mood and affect. Normal behavior. Normal judgment and thought content.   Urinalysis:      Assessment and Plan:  Pregnancy: G1P0 at [redacted]w[redacted]d  1. Feal CPAM 1-2cm, no issues anticipated. Let peds know on admission  2. Type 1 diabetes mellitus during pregnancy in third trimester Continue with pump. Followed by endocrine. bpp 8/8 per mfm today.   3. Hashimoto's thyroiditis No issues on no meds  4. Supervision of high risk pregnancy in third trimester Pt already set up for IOL on 2345 on 3/15. Pt doesn't want to do foley  bulb. D/w pt that she can be induced anytime and if she would like to be induced earlier to let us know. Pt told to come to hospital if BPs are elevated, check bps bid and/or if CBGs are out of range  Term labor symptoms and general obstetric precautions including but not limited to vaginal bleeding, contractions, leaking of fluid and fetal movement were reviewed in detail with the patient. Please refer to After Visit Summary for other counseling recommendations.   No follow-ups on file.follow up postpartum   Marietta Bing, MD

## 2020-02-20 ENCOUNTER — Other Ambulatory Visit (HOSPITAL_COMMUNITY)
Admission: RE | Admit: 2020-02-20 | Discharge: 2020-02-20 | Disposition: A | Discharge status: Home / Self Care | Payer: Managed Care, Other (non HMO) | Source: Ambulatory Visit | Attending: Family Medicine | Admitting: Family Medicine

## 2020-02-20 LAB — SARS CORONAVIRUS 2 (TAT 6-24 HRS): SARS Coronavirus 2: NEGATIVE

## 2020-02-22 ENCOUNTER — Encounter: Payer: Managed Care, Other (non HMO) | Admitting: Family Medicine

## 2020-02-22 ENCOUNTER — Encounter: Payer: Self-pay | Admitting: Family Medicine

## 2020-02-22 ENCOUNTER — Ambulatory Visit (INDEPENDENT_AMBULATORY_CARE_PROVIDER_SITE_OTHER): Payer: Managed Care, Other (non HMO) | Admitting: Family Medicine

## 2020-02-22 ENCOUNTER — Other Ambulatory Visit: Payer: Self-pay

## 2020-02-22 VITALS — Ht 62.0 in

## 2020-02-22 DIAGNOSIS — O99282 Endocrine, nutritional and metabolic diseases complicating pregnancy, second trimester: Secondary | ICD-10-CM | POA: Diagnosis not present

## 2020-02-22 DIAGNOSIS — T7840XA Allergy, unspecified, initial encounter: Secondary | ICD-10-CM | POA: Insufficient documentation

## 2020-02-22 DIAGNOSIS — J4520 Mild intermittent asthma, uncomplicated: Secondary | ICD-10-CM | POA: Diagnosis not present

## 2020-02-22 DIAGNOSIS — O99513 Diseases of the respiratory system complicating pregnancy, third trimester: Secondary | ICD-10-CM

## 2020-02-22 DIAGNOSIS — Z3493 Encounter for supervision of normal pregnancy, unspecified, third trimester: Secondary | ICD-10-CM | POA: Diagnosis not present

## 2020-02-22 DIAGNOSIS — Z0000 Encounter for general adult medical examination without abnormal findings: Secondary | ICD-10-CM

## 2020-02-22 DIAGNOSIS — O24013 Pre-existing diabetes mellitus, type 1, in pregnancy, third trimester: Secondary | ICD-10-CM | POA: Diagnosis not present

## 2020-02-22 DIAGNOSIS — E063 Autoimmune thyroiditis: Secondary | ICD-10-CM

## 2020-02-22 MED ORDER — MONTELUKAST SODIUM 10 MG PO TABS: 10.0000 mg | ORAL_TABLET | Freq: Every day | ORAL | 3 refills | Status: DC

## 2020-02-22 NOTE — Patient Instructions (Signed)
Follow-up with the annual physical in June.  Check the dates of when you had your last physical and form completion for school and make sure it follows at least 1 day after if your insurance requires.  Please help [place] help you:  We are honored you have chosen [facility] for your Primary [facility] home. Below you will find basic instructions that you may need to access in the future. Please help [place] help you by reading the instructions, which cover many of the frequent questions we experience.   Prescription refills and request:  -In order to allow more efficient response time, please call your pharmacy for all refills. They will forward the request electronically to [place]. This allows for the quickest possible response. Request left on a nurse line can take longer to refill, since these are checked as time allows between office patients and other phone calls.  - refill request can take up to 3-5 working days to complete.  - If request is sent electronically and request is appropiate, it is usually completed in 1-2 business days.  - all patients will need to be seen routinely for all chronic medical conditions requiring prescription medications (see follow-up below). If you are overdue for follow up on your condition, you will be asked to make an appointment and we will call in enough medication to cover you until your appointment (up to 30 days).  - all controlled substances will require a face to face visit to request/refill.  - if you desire your prescriptions to go through a new pharmacy, and have an active script at original pharmacy, you will need to call your pharmacy and have scripts transferred to new pharmacy. This is completed between the pharmacy locations and not by your provider.    Results: Our office handles many outgoing and incoming calls daily. If we have not contacted you within 1 week about your results, please check your mychart to see if there is a message first and if not, then  contact our office.  In helping with this matter, you help decrease call volume, and therefore allow [place] to be able to respond to patients needs more efficiently.  We will always attempt to call you with results,  normal or abnormal. However, if we are unable to reach you we will send a message in your my chart with results.   Acute office visits (sick visit):  An acute visit is intended for a new problem and are scheduled in shorter time slots to allow schedule openings for patients with new problems. This is the appropriate visit to discuss a new problem. Problems will not be addressed by phone call or Echart message. Appointment is needed if requesting treatment. In order to provide you with excellent quality medical care with proper time for you to explain your problem, have an exam and receive treatment with instructions, these appointments should be limited to one new problem per visit. If you experience a new problem, in which you desire to be addressed, please make an acute office visit, we save openings on the schedule to accommodate you. Please do not save your new problem for any other type of visit, let us take care of it properly and quickly for you.   Follow up visits:  Depending on your condition(s) your provider will need to see you routinely in order to provide you with quality care and prescribe medication(s). Most chronic conditions (Example: hypertension, Diabetes, depression/anxiety... etc), require visits a couple times a year. Your  provider will instruct you on proper follow up for your personal medical conditions and history. Please make certain to make follow up appointments for your condition as instructed. Failing to do so could result in lapse in your medication treatment/refills. If you request a refill, and are overdue to be seen on a condition, we will always provide you with a 30 day script (once) to allow you time to schedule.    Medicare wellness (well visit): - we have a  wonderful Nurse Selena Batten), that will meet with you and provide you will yearly medicare wellness visits. These visits should occur yearly (can not be scheduled less than 1 calendar year apart) and cover preventive health, immunizations, advance directives and screenings you are entitled to yearly through your medicare benefits. Do not miss out on your entitled benefits, this is when medicare will pay for these benefits to be ordered for you.  These are strongly encouraged by your provider and is the appropriate type of visit to make certain you are up to date with all preventive health benefits. If you have not had your medicare wellness exam in the last 12 months, please make certain to schedule one by calling the office and schedule your medicare wellness with Selena Batten as soon as possible.   Yearly physical (well visit):  - Adults are recommended to be seen yearly for physicals. Check with your insurance and date of your last physical, most insurances require one calendar year between physicals. Physicals include all preventive health topics, screenings, medical exam and labs that are appropriate for gender/age and history. You may have fasting labs needed at this visit. This is a well visit (not a sick visit), new problems should not be covered during this visit (see acute visit).  - Pediatric patients are seen more frequently when they are younger. Your provider will advise you on well child visit timing that is appropriate for your their age. - This is not a medicare wellness visit. Medicare wellness exams do not have an exam portion to the visit. Some medicare companies allow for a physical, some do not allow a yearly physical. If your medicare allows a yearly physical you can schedule the medicare wellness with our nurse Selena Batten and have your physical with your provider after, on the same day. Please check with insurance for your full benefits.   Late Policy/No Shows:  - all new patients should arrive 15-30  minutes earlier than appointment to allow [place] time  to  obtain all personal demographics,  insurance information and for you to complete office paperwork. - All established patients should arrive 10-15 minutes earlier than appointment time to update all information and be checked in .  - In our best efforts to run on time, if you are late for your appointment you will be asked to either reschedule or if able, we will work you back into the schedule. There will be a wait time to work you back in the schedule,  depending on availability.  - If you are unable to make it to your appointment as scheduled, please call 24 hours ahead of time to allow [place] to fill the time slot with someone else who needs to be seen. If you do not cancel your appointment ahead of time, you may be charged a no show fee.

## 2020-02-22 NOTE — Progress Notes (Signed)
Patient ID: Nicole Carrillo, female  DOB: Feb 25, 1986, 34 y.o.   MRN: [id number] Patient Care Team    Relationship Specialty Notifications Start End  Ma Hillock, DO PCP - General Family Medicine  02/22/20   Maury Dus, MD Resident Endocrinology  02/22/20     Chief Complaint  Patient presents with  . Establish Care    Prior PCP Dr Leory Plowman [facility]. Pt has no concerns.     Subjective:  Nicole Carrillo is a 34 y.o.  female present for new patient establishment. All past medical history, surgical history, allergies, family history, immunizations, medications and social history were updated in the electronic medical record today. All recent labs, ED visits and hospitalizations within the last year were reviewed.  Type 1 diabetes mellitus during pregnancy in third trimester Patient reports she was diagnosed with insulin-dependent diabetes in 2011 at age 27.  She has been under the care of [facility] endocrinology since with routine diabetic management.  She has an insulin pump-Humalog use.  Hashimoto's thyroiditis Patient reports she was told she has Hashimoto's thyroiditis.  She has not required use of thyroid supplement. TPO antibiotics 672.5 09/09/2018.  Third trimester pregnancy Patient is a G1P0, [redacted]w[redacted]d reporting to women's tonight to have induction of labor of her son.  Patient is established with high risk clinic secondary to her type 1 diabetes, Hashimoto's thyroiditis and fetal CPAM.  Asthma/allergies Patient reports she has history of allergies in which she takes Singulair, Zyrtec, Allegra and albuterol as needed.  She reports she is not certain if she has classic asthma, reactive airway disease or reflux.  She at one time was seeing a pulmonologist and was prescribed Breo, and which she felt was not helpful for her symptoms.  She has used the albuterol inhaler at times when feeling mild short of breath and she reports sometimes it helps, but most of the times it does  not.  Depression screen [facility] 2/9 02/22/2020 02/17/2020 02/10/2020 01/14/2020 12/30/2019  Decreased Interest 0 0 0 0 0  Down, Depressed, Hopeless 0 0 0 0 0  PHQ - 2 Score 0 0 0 0 0  Altered sleeping - 0 0 0 0  Tired, decreased energy - 0 0 0 0  Change in appetite - 0 0 0 0  Feeling bad or failure about yourself  - 0 0 0 0  Trouble concentrating - 0 0 0 0  Moving slowly or fidgety/restless - 0 0 0 0  Suicidal thoughts - 0 0 0 0  PHQ-9 Score - 0 0 0 0  Difficult doing work/chores - Not difficult at all - - -   GAD 7 : Generalized Anxiety Score 02/17/2020 02/10/2020 01/14/2020 12/30/2019  Nervous, Anxious, on Edge 0 0 0 0  Control/stop worrying 0 0 0 0  Worry too much - different things 0 0 0 0  Trouble relaxing 0 0 0 0  Restless 0 0 0 0  Easily annoyed or irritable 0 0 0 0  Afraid - awful might happen 0 0 0 0  Total GAD 7 Score 0 0 0 0       No flowsheet data found.  Immunization History  Administered Date(s) Administered  . Tdap 12/17/2019   No exam data present  Past Medical History:  Diagnosis Date  . Allergy   . Asthma   . Chicken pox   . Diabetes mellitus type 1 ([place])   . GERD (gastroesophageal reflux disease)   . Hashimoto's thyroiditis   .  History of migraine   . Insulin pump in place   . Orthodontics    permanent upper and lower retainers  . PVD (posterior vitreous detachment), right   . Wears contact lenses    Allergies  Allergen Reactions  . Crab [Shellfish Allergy] Shortness Of Breath    Scallops cause rash.  Shrimp and lobster - no problems.  . Acetaminophen     Effects blood sugars and insulin pump.  . Cephalexin     Headaches, dizziness, nausea  . Pneumococcal Vaccines     Local injection swelling  . Prednisone     Prefers not to take because of uncontrollable BG's on this med;  Headaches, dizziness, nausea  . Soy Allergy     Blood test  . Adhesive [Tape] Rash    With extended exposure  . Peanut-Containing Drug Products Rash   Past Surgical History:   Procedure Laterality Date  . DENTAL SURGERY    . ENDOSCOPIC CONCHA BULLOSA RESECTION Bilateral 07/24/2018   Procedure: ENDOSCOPIC CONCHA BULLOSA MIDDLE TURBINATE;  Surgeon: Vernie Murders, MD;  Location: [facility];  Service: ENT;  Laterality: Bilateral;  Diabetic - insulin pump  . SEPTOPLASTY N/A 07/24/2018   Procedure: SEPTOPLASTY;  Surgeon: Vernie Murders, MD;  Location: [facility];  Service: ENT;  Laterality: N/A;  NEEDS TO BE FIRST INSULIN PUMP  . TURBINATE REDUCTION Bilateral 07/24/2018   Procedure: INFERIOR TURBINATE REDUCTION;  Surgeon: Vernie Murders, MD;  Location: [facility];  Service: ENT;  Laterality: Bilateral;  . WISDOM TOOTH EXTRACTION     Family History  Problem Relation Age of Onset  . Arthritis Mother   . Stroke Mother   . Miscarriages / [place] Mother   . Arthritis Father   . Dementia Maternal Grandmother   . Emphysema Maternal Grandfather    Social History   Social History Narrative  . Not on file    Allergies as of 02/22/2020      Reactions   Crab [shellfish Allergy] Shortness Of Breath   Scallops cause rash.  Shrimp and lobster - no problems.   Acetaminophen    Effects blood sugars and insulin pump.   Cephalexin    Headaches, dizziness, nausea   Pneumococcal Vaccines    Local injection swelling   Prednisone    Prefers not to take because of uncontrollable BG's on this med;  Headaches, dizziness, nausea   Soy Allergy    Blood test   Adhesive [tape] Rash   With extended exposure   Peanut-containing Drug Products Rash      Medication List       Accurate as of February 22, 2020  4:04 PM. If you have any questions, ask your nurse or doctor.        albuterol 108 (90 Base) MCG/ACT inhaler Commonly known as: VENTOLIN HFA Inhale into the lungs every 6 (six) hours as needed for wheezing or shortness of breath.   aspirin EC 81 MG tablet Take 81 mg by mouth daily.   COLACE PO Take by mouth.   EPINEPHrine 0.3 mg/0.3 mL  Soaj injection Commonly known as: EPI-PEN Inject into the muscle.   fexofenadine 180 MG tablet Commonly known as: ALLEGRA Take 180 mg by mouth daily.   insulin lispro 100 UNIT/ML injection Commonly known as: HUMALOG Inject into the skin. For insulin pump   insulin pump Soln Inject into the skin.   METAMUCIL PO Take by mouth.   montelukast 10 MG tablet Commonly known as: SINGULAIR Take 1 tablet (  10 mg total) by mouth daily.   NON FORMULARY Continuous glucose monitor   PRENATAL+DHA PO Take by mouth.   ZYRTEC PO Take by mouth.       All past medical history, surgical history, allergies, family history, immunizations andmedications were updated in the EMR today and reviewed under the history and medication portions of their EMR.     No results found.  ROS: 14 pt review of systems performed and negative (unless mentioned in an HPI)  Objective: Ht 5\' 2"  (1.575 m)   LMP 06/02/2019 (Exact Date)   BMI 26.28 kg/m  Gen: Afebrile. No acute distress. Nontoxic in appearance, well-developed, well-nourished, very pleasant female HENT: AT. [place]. Eyes:Pupils Equal Round Reactive to light, Extraocular movements intact,  Conjunctiva without redness, discharge or icterus. Chest: No cough or shortness of breath present Abd: Gravid Neuro/Msk: Alert. Oriented x3. Psych: Normal affect, dress and demeanor. Normal speech. Normal thought content and judgment.  Assessment/plan: Nicole Carrillo is a 34 y.o. female present for Est Care Type 1 diabetes mellitus during pregnancy in third trimester Continue management with his [facility] endocrinology> if eventually desiring referral to endocrinologist in [place] would be happy to place for her.  Hashimoto's thyroiditis Continue to follow with endocrine.  Third trimester pregnancy -G1P0, [redacted]w[redacted]d reporting to women's tonight to have induction of labor of her son.  Patient is established with high risk clinic secondary to her type 1 diabetes,  Hashimoto's thyroiditis and fetal CPAM.  Asthma/allergies Continue Zyrtec/Allegra Continue Singulair Continue albuterol as needed We will follow up with her at her CPE in June, if needed can refer to asthma and allergy.  No follow-ups on file.  No orders of the defined types were placed in this encounter.  Meds ordered this encounter  Medications  . montelukast (SINGULAIR) 10 MG tablet    Sig: Take 1 tablet (10 mg total) by mouth daily.    Dispense:  90 tablet    Refill:  3   Referral Orders  No referral(s) requested today     Note is dictated utilizing voice recognition software. Although note has been proof read prior to signing, occasional typographical errors still can be missed. If any questions arise, please do not hesitate to call for verification.  Electronically signed by: July, DO [facility]

## 2020-02-23 ENCOUNTER — Inpatient Hospital Stay (HOSPITAL_COMMUNITY): Payer: Managed Care, Other (non HMO)

## 2020-02-23 ENCOUNTER — Other Ambulatory Visit: Payer: Self-pay

## 2020-02-23 ENCOUNTER — Inpatient Hospital Stay (HOSPITAL_COMMUNITY): Payer: Managed Care, Other (non HMO) | Admitting: Anesthesiology

## 2020-02-23 ENCOUNTER — Inpatient Hospital Stay (HOSPITAL_COMMUNITY)
Admission: RE | Admit: 2020-02-23 | Discharge: 2020-02-26 | DRG: 807 | Disposition: A | Discharge status: Home / Self Care | Payer: Managed Care, Other (non HMO) | Attending: Family Medicine | Admitting: Family Medicine

## 2020-02-23 ENCOUNTER — Encounter (HOSPITAL_COMMUNITY): Payer: Self-pay | Admitting: Family Medicine

## 2020-02-23 DIAGNOSIS — Z20822 Contact with and (suspected) exposure to covid-19: Secondary | ICD-10-CM | POA: Diagnosis present

## 2020-02-23 DIAGNOSIS — E109 Type 1 diabetes mellitus without complications: Secondary | ICD-10-CM | POA: Diagnosis present

## 2020-02-23 DIAGNOSIS — O24013 Pre-existing diabetes mellitus, type 1, in pregnancy, third trimester: Secondary | ICD-10-CM | POA: Diagnosis not present

## 2020-02-23 DIAGNOSIS — O9952 Diseases of the respiratory system complicating childbirth: Secondary | ICD-10-CM | POA: Diagnosis present

## 2020-02-23 DIAGNOSIS — O2402 Pre-existing diabetes mellitus, type 1, in childbirth: Secondary | ICD-10-CM | POA: Diagnosis present

## 2020-02-23 DIAGNOSIS — Z9641 Presence of insulin pump (external) (internal): Secondary | ICD-10-CM | POA: Diagnosis present

## 2020-02-23 DIAGNOSIS — Z3A38 38 weeks gestation of pregnancy: Secondary | ICD-10-CM | POA: Diagnosis not present

## 2020-02-23 DIAGNOSIS — J45909 Unspecified asthma, uncomplicated: Secondary | ICD-10-CM | POA: Diagnosis present

## 2020-02-23 DIAGNOSIS — O9962 Diseases of the digestive system complicating childbirth: Secondary | ICD-10-CM | POA: Diagnosis present

## 2020-02-23 DIAGNOSIS — O24019 Pre-existing diabetes mellitus, type 1, in pregnancy, unspecified trimester: Secondary | ICD-10-CM | POA: Diagnosis present

## 2020-02-23 DIAGNOSIS — O35BXX0 Maternal care for other (suspected) fetal abnormality and damage, fetal cardiac anomalies, not applicable or unspecified: Secondary | ICD-10-CM | POA: Diagnosis present

## 2020-02-23 DIAGNOSIS — O99284 Endocrine, nutritional and metabolic diseases complicating childbirth: Secondary | ICD-10-CM | POA: Diagnosis present

## 2020-02-23 DIAGNOSIS — O1490 Unspecified pre-eclampsia, unspecified trimester: Secondary | ICD-10-CM

## 2020-02-23 DIAGNOSIS — O1404 Mild to moderate pre-eclampsia, complicating childbirth: Secondary | ICD-10-CM | POA: Diagnosis present

## 2020-02-23 DIAGNOSIS — E063 Autoimmune thyroiditis: Secondary | ICD-10-CM | POA: Diagnosis present

## 2020-02-23 DIAGNOSIS — O0990 Supervision of high risk pregnancy, unspecified, unspecified trimester: Secondary | ICD-10-CM

## 2020-02-23 DIAGNOSIS — O358XX0 Maternal care for other (suspected) fetal abnormality and damage, not applicable or unspecified: Secondary | ICD-10-CM | POA: Diagnosis present

## 2020-02-23 DIAGNOSIS — Z794 Long term (current) use of insulin: Secondary | ICD-10-CM | POA: Diagnosis not present

## 2020-02-23 DIAGNOSIS — O4202 Full-term premature rupture of membranes, onset of labor within 24 hours of rupture: Secondary | ICD-10-CM | POA: Diagnosis not present

## 2020-02-23 DIAGNOSIS — K219 Gastro-esophageal reflux disease without esophagitis: Secondary | ICD-10-CM | POA: Diagnosis present

## 2020-02-23 LAB — GLUCOSE, CAPILLARY
Glucose-Capillary: 103 mg/dL — ABNORMAL HIGH (ref 70–99)
Glucose-Capillary: 108 mg/dL — ABNORMAL HIGH (ref 70–99)
Glucose-Capillary: 117 mg/dL — ABNORMAL HIGH (ref 70–99)
Glucose-Capillary: 118 mg/dL — ABNORMAL HIGH (ref 70–99)
Glucose-Capillary: 121 mg/dL — ABNORMAL HIGH (ref 70–99)
Glucose-Capillary: 121 mg/dL — ABNORMAL HIGH (ref 70–99)
Glucose-Capillary: 137 mg/dL — ABNORMAL HIGH (ref 70–99)
Glucose-Capillary: 71 mg/dL (ref 70–99)
Glucose-Capillary: 76 mg/dL (ref 70–99)
Glucose-Capillary: 79 mg/dL (ref 70–99)
Glucose-Capillary: 81 mg/dL (ref 70–99)
Glucose-Capillary: 82 mg/dL (ref 70–99)
Glucose-Capillary: 84 mg/dL (ref 70–99)
Glucose-Capillary: 95 mg/dL (ref 70–99)
Glucose-Capillary: 99 mg/dL (ref 70–99)

## 2020-02-23 LAB — CBC
HCT: 35.9 % — ABNORMAL LOW (ref 36.0–46.0)
Hemoglobin: 12.3 g/dL (ref 12.0–15.0)
MCH: 31.2 pg (ref 26.0–34.0)
MCHC: 34.3 g/dL (ref 30.0–36.0)
MCV: 91.1 fL (ref 80.0–100.0)
Platelets: 254 10*3/uL (ref 150–400)
RBC: 3.94 MIL/uL (ref 3.87–5.11)
RDW: 13.4 % (ref 11.5–15.5)
WBC: 8 10*3/uL (ref 4.0–10.5)
nRBC: 0 % (ref 0.0–0.2)

## 2020-02-23 LAB — PROTEIN / CREATININE RATIO, URINE
Creatinine, Urine: 47.29 mg/dL
Protein Creatinine Ratio: 0.3 mg/mg{Cre} — ABNORMAL HIGH (ref 0.00–0.15)
Total Protein, Urine: 14 mg/dL

## 2020-02-23 LAB — COMPREHENSIVE METABOLIC PANEL
ALT: 18 U/L (ref 0–44)
AST: 22 U/L (ref 15–41)
Albumin: 2.8 g/dL — ABNORMAL LOW (ref 3.5–5.0)
Alkaline Phosphatase: 75 U/L (ref 38–126)
Anion gap: 10 (ref 5–15)
BUN: 9 mg/dL (ref 6–20)
CO2: 21 mmol/L — ABNORMAL LOW (ref 22–32)
Calcium: 9 mg/dL (ref 8.9–10.3)
Chloride: 106 mmol/L (ref 98–111)
Creatinine, Ser: 0.67 mg/dL (ref 0.44–1.00)
GFR calc Af Amer: >60 mL/min (ref >60)
GFR calc non Af Amer: >60 mL/min (ref >60)
Glucose, Bld: 106 mg/dL — ABNORMAL HIGH (ref 70–99)
Potassium: 3.8 mmol/L (ref 3.5–5.1)
Sodium: 137 mmol/L (ref 135–145)
Total Bilirubin: 0.6 mg/dL (ref 0.3–1.2)
Total Protein: 6.1 g/dL — ABNORMAL LOW (ref 6.5–8.1)

## 2020-02-23 LAB — HEMOGLOBIN A1C
Hgb A1c MFr Bld: 7 % — ABNORMAL HIGH (ref 4.8–5.6)
Mean Plasma Glucose: 154.2 mg/dL

## 2020-02-23 LAB — TYPE AND SCREEN
ABO/RH(D): B POS
Antibody Screen: NEGATIVE

## 2020-02-23 LAB — ABO/RH: ABO/RH(D): B POS

## 2020-02-23 LAB — RPR: RPR Ser Ql: NONREACTIVE

## 2020-02-23 MED ORDER — LACTATED RINGERS IV SOLN: INTRAVENOUS | Status: DC

## 2020-02-23 MED ORDER — DEXTROSE 50 % IV SOLN: 0.0000 mL | INTRAVENOUS | Status: DC | PRN

## 2020-02-23 MED ORDER — SOD CITRATE-CITRIC ACID 500-334 MG/5ML PO SOLN
30.0000 mL | ORAL | Status: DC | PRN
  Administered 2020-02-23: 30 mL via ORAL
  Filled 2020-02-23: qty 30

## 2020-02-23 MED ORDER — OXYTOCIN 40 UNITS IN NORMAL SALINE INFUSION - SIMPLE MED: 2.5000 [IU]/h | INTRAVENOUS | Status: DC

## 2020-02-23 MED ORDER — FENTANYL CITRATE (PF) 100 MCG/2ML IJ SOLN
50.0000 ug | INTRAMUSCULAR | Status: DC | PRN
  Administered 2020-02-23: 50 ug via INTRAVENOUS
  Filled 2020-02-23: qty 2

## 2020-02-23 MED ORDER — TERBUTALINE SULFATE 1 MG/ML IJ SOLN
0.2500 mg | Freq: Once | INTRAMUSCULAR | Status: DC | PRN
  Filled 2020-02-23: qty 1

## 2020-02-23 MED ORDER — DIPHENHYDRAMINE HCL 50 MG/ML IJ SOLN: 12.5000 mg | INTRAMUSCULAR | Status: DC | PRN

## 2020-02-23 MED ORDER — METOCLOPRAMIDE HCL 5 MG/ML IJ SOLN
10.0000 mg | Freq: Once | INTRAMUSCULAR | Status: AC
  Administered 2020-02-23: 10 mg via INTRAVENOUS
  Filled 2020-02-23: qty 2

## 2020-02-23 MED ORDER — LIDOCAINE HCL (PF) 1 % IJ SOLN
INTRAMUSCULAR | Status: DC | PRN
  Administered 2020-02-23: 6 mL via EPIDURAL

## 2020-02-23 MED ORDER — DEXTROSE-NACL 5-0.45 % IV SOLN: INTRAVENOUS | Status: DC

## 2020-02-23 MED ORDER — PHENYLEPHRINE 40 MCG/ML (10ML) SYRINGE FOR IV PUSH (FOR BLOOD PRESSURE SUPPORT)
80.0000 ug | PREFILLED_SYRINGE | INTRAVENOUS | Status: DC | PRN
  Filled 2020-02-23: qty 10

## 2020-02-23 MED ORDER — LACTATED RINGERS AMNIOINFUSION
INTRAVENOUS | Status: DC
  Administered 2020-02-23: 300 mL via INTRAUTERINE

## 2020-02-23 MED ORDER — MISOPROSTOL 50MCG HALF TABLET
50.0000 ug | ORAL_TABLET | ORAL | Status: DC | PRN
  Administered 2020-02-23: 50 ug via BUCCAL
  Filled 2020-02-23: qty 1

## 2020-02-23 MED ORDER — OXYTOCIN 40 UNITS IN NORMAL SALINE INFUSION - SIMPLE MED
1.0000 m[IU]/min | INTRAVENOUS | Status: DC
  Administered 2020-02-23: 2 m[IU]/min via INTRAVENOUS
  Filled 2020-02-23: qty 1000

## 2020-02-23 MED ORDER — FENTANYL-BUPIVACAINE-NACL 0.5-0.125-0.9 MG/250ML-% EP SOLN
12.0000 mL/h | EPIDURAL | Status: DC | PRN
  Filled 2020-02-23: qty 250

## 2020-02-23 MED ORDER — EPHEDRINE 5 MG/ML INJ: 10.0000 mg | INTRAVENOUS | Status: DC | PRN

## 2020-02-23 MED ORDER — SODIUM CHLORIDE (PF) 0.9 % IJ SOLN
INTRAMUSCULAR | Status: DC | PRN
  Administered 2020-02-23: 12 mL/h via EPIDURAL

## 2020-02-23 MED ORDER — TERBUTALINE SULFATE 1 MG/ML IJ SOLN: 0.2500 mg | Freq: Once | INTRAMUSCULAR | Status: DC | PRN

## 2020-02-23 MED ORDER — LACTATED RINGERS IV SOLN
500.0000 mL | INTRAVENOUS | Status: DC | PRN
  Administered 2020-02-23: 1000 mL via INTRAVENOUS
  Administered 2020-02-24: 500 mL via INTRAVENOUS

## 2020-02-23 MED ORDER — SODIUM CHLORIDE 0.9 % IV SOLN: INTRAVENOUS | Status: DC

## 2020-02-23 MED ORDER — MISOPROSTOL 25 MCG QUARTER TABLET: 25.0000 ug | ORAL_TABLET | ORAL | Status: DC | PRN

## 2020-02-23 MED ORDER — PHENYLEPHRINE 40 MCG/ML (10ML) SYRINGE FOR IV PUSH (FOR BLOOD PRESSURE SUPPORT): 80.0000 ug | PREFILLED_SYRINGE | INTRAVENOUS | Status: DC | PRN

## 2020-02-23 MED ORDER — LIDOCAINE HCL (PF) 1 % IJ SOLN: 30.0000 mL | INTRAMUSCULAR | Status: DC | PRN

## 2020-02-23 MED ORDER — ACETAMINOPHEN 325 MG PO TABS
650.0000 mg | ORAL_TABLET | Freq: Four times a day (QID) | ORAL | Status: DC | PRN
  Administered 2020-02-23: 650 mg via ORAL
  Filled 2020-02-23: qty 2

## 2020-02-23 MED ORDER — OXYTOCIN BOLUS FROM INFUSION
500.0000 mL | Freq: Once | INTRAVENOUS | Status: AC
  Administered 2020-02-24: 500 mL/h via INTRAVENOUS

## 2020-02-23 MED ORDER — LACTATED RINGERS IV SOLN
500.0000 mL | Freq: Once | INTRAVENOUS | Status: AC
  Administered 2020-02-23: 500 mL via INTRAVENOUS

## 2020-02-23 MED ORDER — INSULIN REGULAR(HUMAN) IN NACL 100-0.9 UT/100ML-% IV SOLN
INTRAVENOUS | Status: DC
  Administered 2020-02-23: 1.3 [IU]/h via INTRAVENOUS
  Filled 2020-02-23: qty 100

## 2020-02-23 MED ORDER — ONDANSETRON HCL 4 MG/2ML IJ SOLN
4.0000 mg | Freq: Four times a day (QID) | INTRAMUSCULAR | Status: DC | PRN
  Administered 2020-02-24: 4 mg via INTRAVENOUS
  Filled 2020-02-23: qty 2

## 2020-02-23 NOTE — Progress Notes (Signed)
LABOR PROGRESS NOTE  Nicole Carrillo is a 34 y.o. G1P0 at [redacted]w[redacted]d  admitted for IOL for T1DM and PreE w/o SF.  Subjective: Comfortable w epidural  Objective: BP 134/77   Pulse (!) 57   Temp 98 F (36.7 C) (Oral)   Resp 15   Ht 5\' 2"  (1.575 m)   Wt 65.7 kg   LMP 06/02/2019 (Exact Date)   SpO2 99%   BMI 26.48 kg/m  or  Vitals:   02/23/20 1427 02/23/20 1431 02/23/20 1436 02/23/20 1501  BP: (!) 143/75 (!) 145/80 134/77   Pulse: (!) 53 (!) 52 (!) 57   Resp: 15 15 15 15   Temp: 98 F (36.7 C)     TempSrc: Oral     SpO2:      Weight:      Height:         Dilation: 6 Effacement (%): 70 Cervical Position: Anterior Station: -1, 0 Presentation: Vertex Exam by:: RNC FHT: baseline rate 135, moderate varibility, -acel, variable and late decel Toco: q3-4 min  Labs: Lab Results  Component Value Date   WBC 8.0 02/23/2020   HGB 12.3 02/23/2020   HCT 35.9 (L) 02/23/2020   MCV 91.1 02/23/2020   PLT 254 02/23/2020    Patient Active Problem List   Diagnosis Date Noted  . Indication for care or intervention in labor or delivery 02/23/2020  . Third trimester pregnancy 02/22/2020  . Allergies 02/22/2020  . Fetal CPAM   . Type 1 diabetes mellitus in pregnancy 11/23/2019  . Hashimoto's thyroiditis 11/23/2019  . Supervision of high-risk pregnancy 11/23/2019  . Mild asthma without complication 08/13/2019  . Gastroesophageal reflux disease without esophagitis 08/13/2019    Assessment / Plan: 34 y.o. G1P0 at [redacted]w[redacted]d here for IOL for T1DM and PreE w/o SF.  Labor: s/p miso x1, FB, SROM, and pitocin started at 0530 but stopped at 1500 due to recurrent lates. IUPC placed, may start amnioinfusion pending strip. Given that patient is ruptured and 6cm will see if she can maintain contraction pattern without augmentation. Infant feels LOT slowly rotating to LOA on exam. Fetal Wellbeing:  Cat II for variables and lates, continues to have moderate variability  Pain Control:   epidural GBS: neg Anticipated MOD:  SVD  T1DM: switched to endotool, good control (82>117)  PreE w/o SF: asymptomatic and BP's mostly mild range, BP's have intermittently been severe but return quickly to normal, ctm closely for signs of severe disease  32, MD/MPH OB Fellow  02/23/2020, 3:38 PM

## 2020-02-23 NOTE — Progress Notes (Signed)
LABOR PROGRESS NOTE  Nicole Carrillo is a 34 y.o. G1P0 at [redacted]w[redacted]d  admitted for IOL for T1DM and PreE w/o SF.  Subjective: Comfortable with epidural. Not feeling pressure. Was having some nausea earlier.   Objective: BP (!) 158/86   Pulse 80   Temp 98 F (36.7 C) (Oral)   Resp 18   Ht 5\' 2"  (1.575 m)   Wt 65.7 kg   LMP 06/02/2019 (Exact Date)   SpO2 99%   BMI 26.48 kg/m  or  Vitals:   02/23/20 1950 02/23/20 2001 02/23/20 2006 02/23/20 2101  BP: 129/82 127/81  (!) 158/86  Pulse: 91 79  80  Resp:  17  18  Temp:   98 F (36.7 C)   TempSrc:   Oral   SpO2:      Weight:      Height:         Dilation: 9 Effacement (%): 100 Cervical Position: Anterior Station: 0, Plus 1 Presentation: Vertex Exam by:: Dr. [email or web address] FHT: baseline rate 135, moderate varibility, -acel, intermittent late decels Toco: q3-4 min  Labs: Lab Results  Component Value Date   WBC 8.0 02/23/2020   HGB 12.3 02/23/2020   HCT 35.9 (L) 02/23/2020   MCV 91.1 02/23/2020   PLT 254 02/23/2020    Patient Active Problem List   Diagnosis Date Noted  . Indication for care or intervention in labor or delivery 02/23/2020  . Third trimester pregnancy 02/22/2020  . Allergies 02/22/2020  . Fetal CPAM   . Type 1 diabetes mellitus in pregnancy 11/23/2019  . Hashimoto's thyroiditis 11/23/2019  . Supervision of high-risk pregnancy 11/23/2019  . Mild asthma without complication 08/13/2019  . Gastroesophageal reflux disease without esophagitis 08/13/2019    Assessment / Plan: 34 y.o. G1P0 at [redacted]w[redacted]d here for IOL for T1DM and PreE w/o SF.  Labor: s/p Cyto x1, FB, SROM. Pit has been on and off today due to intermittent Cat II tracing. Currently at 2 and although MVU's not adequate, making cervical change. Will increase to 3 in effort to expedite delivery. Will trial hands and knees as cervix is more anterior and baby feels LOT. Cont position changes thereafter. Anticipate SVD. Fetal Wellbeing:  Cat II; reassuring for  moderate variability and making good cervical change toward delivery  Pain Control:  epidural GBS: neg Anticipated MOD:  SVD  T1DM: On EndoTool with continued good control. Last glucose 95.  PreE w/o SF: asymptomatic and BP's mostly mild range to normal, BP's have intermittently been severe but return quickly to normal, ctm closely for signs of severe disease  [redacted]w[redacted]d, MD [facility] Family Medicine Fellow, [facility] for [facility], [facility] 02/23/2020, 9:40 PM

## 2020-02-23 NOTE — Progress Notes (Signed)
LABOR PROGRESS NOTE  Nicole Carrillo is a 34 y.o. G1P0 at [redacted]w[redacted]d  admitted for IOL for T1DM and PreE w/o SF.  Subjective: FB out and SROM Getting epidural  Objective: BP (!) 164/71   Pulse (!) 57   Temp 98.8 F (37.1 C) (Oral)   Resp 20   Ht 5\' 2"  (1.575 m)   Wt 65.7 kg   LMP 06/02/2019 (Exact Date)   BMI 26.48 kg/m  or  Vitals:   02/23/20 1130 02/23/20 1251 02/23/20 1300 02/23/20 1330  BP: (!) 144/83 (!) 146/75 134/70 (!) 164/71  Pulse: 66 (!) 58 60 (!) 57  Resp: 15 15 15 20   Temp: 98.5 F (36.9 C) 98.8 F (37.1 C)    TempSrc: Oral Oral    Weight:      Height:         Dilation: 4.5 Effacement (%): 70 Cervical Position: Posterior Station: -2 Presentation: Vertex Exam by:: RNC FHT: baseline rate 140, moderate varibility, -acel, early and late decel Toco: irregular, frequent coupling  Labs: Lab Results  Component Value Date   WBC 8.0 02/23/2020   HGB 12.3 02/23/2020   HCT 35.9 (L) 02/23/2020   MCV 91.1 02/23/2020   PLT 254 02/23/2020    Patient Active Problem List   Diagnosis Date Noted  . Indication for care or intervention in labor or delivery 02/23/2020  . Third trimester pregnancy 02/22/2020  . Allergies 02/22/2020  . Fetal CPAM   . Type 1 diabetes mellitus in pregnancy 11/23/2019  . Hashimoto's thyroiditis 11/23/2019  . Supervision of high-risk pregnancy 11/23/2019  . Mild asthma without complication 08/13/2019  . Gastroesophageal reflux disease without esophagitis 08/13/2019    Assessment / Plan: 34 y.o. G1P0 at [redacted]w[redacted]d here for IOL for T1DM and PreE w/o SF.  Labor: s/p miso x1, FB, SROM, and pitocin started at 0530. Making progress and getting epidural, cont pitocin. Likely OP given coupling, peanut ball once comfortable. Fetal Wellbeing:  Cat II for intermittent lates, moderate variability is reassuring, ctm closely  Pain Control:  epidural GBS: neg Anticipated MOD:  SVD  T1DM: using own insulin pump at present, has been at goal  (81>79>71>121>118), low threshold for endotool  PreE w/o SF: asymptomatic and BP's mostly mild range, BP's have intermittently been severe but return quickly to normal, ctm closely for signs of severe disease  32, MD/MPH OB Fellow  02/23/2020, 1:59 PM

## 2020-02-23 NOTE — Anesthesia Preprocedure Evaluation (Signed)
Anesthesia Evaluation  Patient identified by MRN, date of birth, ID band Patient awake    Reviewed: Allergy & Precautions, H&P , NPO status , Patient's Chart, lab work & pertinent test results, reviewed documented beta blocker date and time   Airway Mallampati: II  TM Distance: >3 FB Neck ROM: full    Dental no notable dental hx.    Pulmonary asthma ,    Pulmonary exam normal breath sounds clear to auscultation       Cardiovascular negative cardio ROS Normal cardiovascular exam Rhythm:regular Rate:Normal     Neuro/Psych  Headaches, negative psych ROS   GI/Hepatic Neg liver ROS, GERD  ,  Endo/Other  diabetes, Type 1  Renal/GU negative Renal ROS  negative genitourinary   Musculoskeletal   Abdominal   Peds  Hematology negative hematology ROS (+)   Anesthesia Other Findings   Reproductive/Obstetrics (+) Pregnancy                             Anesthesia Physical Anesthesia Plan  ASA: III  Anesthesia Plan: Epidural   Post-op Pain Management:    Induction:   PONV Risk Score and Plan:   Airway Management Planned:   Additional Equipment:   Intra-op Plan:   Post-operative Plan:   Informed Consent: I have reviewed the patients History and Physical, chart, labs and discussed the procedure including the risks, benefits and alternatives for the proposed anesthesia with the patient or authorized representative who has indicated his/her understanding and acceptance.       Plan Discussed with: Anesthesiologist  Anesthesia Plan Comments:         Anesthesia Quick Evaluation

## 2020-02-23 NOTE — Progress Notes (Signed)
Talked with Diabetic Coordinator.  POC to switch to EndoTool in active labor for better management of Type 1.  POC after delivery is to adjust rates on personal insulin pump for two hours.

## 2020-02-23 NOTE — Anesthesia Procedure Notes (Signed)
Epidural Patient location during procedure: OB Start time: 02/23/2020 2:10 PM End time: 02/23/2020 2:15 PM  Staffing Anesthesiologist: Bethena Midget, MD  Preanesthetic Checklist Completed: patient identified, IV checked, site marked, risks and benefits discussed, surgical consent, monitors and equipment checked, pre-op evaluation and timeout performed  Epidural Patient position: sitting Prep: DuraPrep and site prepped and draped Patient monitoring: continuous pulse ox and blood pressure Approach: midline Location: L3-L4 Injection technique: LOR air  Needle:  Needle type: Tuohy  Needle gauge: 17 G Needle length: 9 cm and 9 Needle insertion depth: 5 cm cm Catheter type: closed end flexible Catheter size: 19 Gauge Catheter at skin depth: 10 cm Test dose: negative  Assessment Events: blood not aspirated, injection not painful, no injection resistance, no paresthesia and negative IV test

## 2020-02-23 NOTE — H&P (Signed)
OBSTETRIC ADMISSION HISTORY AND PHYSICAL  Nicole Carrillo is a 34 y.o. female G1P0 with IUP at [redacted]w[redacted]d by LMP and 20 wk [place] presenting for IOL for T1DM. She reports +FMs, No LOF, no VB, no blurry vision, headaches or peripheral edema, and RUQ pain.  She plans on breast feeding. She requests condoms for birth control. She received her prenatal care at [facility]   Dating: By LMP and 20 wk [place] --->  Estimated Date of Delivery: 03/08/20  Sono:  3/3  @[redacted]w[redacted]d , CWD, normal anatomy, cephalic presentation, anterior placental lie, 3053g, 59% EFW Fetus with chest mass  Prenatal History/Complications: Type 1 DM on insulin pump Fetus with chest mass, CPAM Maternal Asthma Hashimoto's thyroiditis not on medication  Past Medical History: Past Medical History:  Diagnosis Date  . Allergy   . Asthma   . Chicken pox   . Diabetes mellitus type 1 (HCC)   . GERD (gastroesophageal reflux disease)   . Hashimoto's thyroiditis   . History of migraine   . Insulin pump in place   . Orthodontics    permanent upper and lower retainers  . PVD (posterior vitreous detachment), right   . Wears contact lenses     Past Surgical History: Past Surgical History:  Procedure Laterality Date  . DENTAL SURGERY    . ENDOSCOPIC CONCHA BULLOSA RESECTION Bilateral 07/24/2018   Procedure: ENDOSCOPIC CONCHA BULLOSA MIDDLE TURBINATE;  Surgeon: 07/26/2018, MD;  Location: [facility];  Service: ENT;  Laterality: Bilateral;  Diabetic - insulin pump  . SEPTOPLASTY N/A 07/24/2018   Procedure: SEPTOPLASTY;  Surgeon: 07/26/2018, MD;  Location: [facility];  Service: ENT;  Laterality: N/A;  NEEDS TO BE FIRST INSULIN PUMP  . TURBINATE REDUCTION Bilateral 07/24/2018   Procedure: INFERIOR TURBINATE REDUCTION;  Surgeon: 07/26/2018, MD;  Location: [facility];  Service: ENT;  Laterality: Bilateral;  . WISDOM TOOTH EXTRACTION      Obstetrical History: OB History    Gravida  1   Para      Term      Preterm       AB      Living  0     SAB      TAB      Ectopic      Multiple      Live Births              Social History: Social History   Socioeconomic History  . Marital status: Married    Spouse name: Not on file  . Number of children: Not on file  . Years of education: Not on file  . Highest education level: Not on file  Occupational History  . Not on file  Tobacco Use  . Smoking status: Never Smoker  . Smokeless tobacco: Never Used  Substance and Sexual Activity  . Alcohol use: Not Currently    Alcohol/week: 1.0 standard drinks    Types: 1 Glasses of wine per week  . Drug use: Not Currently  . Sexual activity: Yes    Partners: Male    Birth control/protection: None  Other Topics Concern  . Not on file  Social History Narrative   Marital status/children/pets: Married. Soon to have 1 son.    Education/employment: MSN, Nurse.    Safety:      -smoke alarm in the home:Yes     - wears seatbelt: Yes     - Feels safe in their relationships: Yes   Social Determinants of Health  Financial Resource Strain:   . Difficulty of Paying Living Expenses:   Food Insecurity: No Food Insecurity  . Worried About Programme researcher, broadcasting/film/video in the Last Year: Never true  . Ran Out of Food in the Last Year: Never true  Transportation Needs: No Transportation Needs  . Lack of Transportation (Medical): No  . Lack of Transportation (Non-Medical): No  Physical Activity:   . Days of Exercise per Week:   . Minutes of Exercise per Session:   Stress:   . Feeling of Stress :   Social Connections:   . Frequency of Communication with Friends and Family:   . Frequency of Social Gatherings with Friends and Family:   . Attends Religious Services:   . Active Member of Clubs or Organizations:   . Attends Banker Meetings:   Marland Kitchen Marital Status:     Family History: Family History  Problem Relation Age of Onset  . Arthritis Mother   . Stroke Mother   . Miscarriages /  [place] Mother   . Arthritis Father   . Dementia Maternal Grandmother   . Emphysema Maternal Grandfather     Allergies: Allergies  Allergen Reactions  . Crab [Shellfish Allergy] Shortness Of Breath    Scallops cause rash.  Shrimp and lobster - no problems.  . Acetaminophen     Effects blood sugars and insulin pump.  . Cephalexin     Headaches, dizziness, nausea  . Pneumococcal Vaccines     Local injection swelling  . Prednisone     Prefers not to take because of uncontrollable BG's on this med;  Headaches, dizziness, nausea  . Soy Allergy     Blood test  . Adhesive [Tape] Rash    With extended exposure  . Peanut-Containing Drug Products Rash    Medications Prior to Admission  Medication Sig Dispense Refill Last Dose  . aspirin EC 81 MG tablet Take 81 mg by mouth daily.   02/22/2020 at Unknown time  . Cetirizine HCl (ZYRTEC PO) Take by mouth.   02/22/2020 at Unknown time  . Docusate Sodium (COLACE PO) Take by mouth.   02/22/2020 at Unknown time  . fexofenadine (ALLEGRA) 180 MG tablet Take 180 mg by mouth daily.   02/22/2020 at Unknown time  . Prenatal MV-Min-Fe Fum-FA-DHA (PRENATAL+DHA PO) Take by mouth.   02/22/2020 at Unknown time  . Psyllium (METAMUCIL PO) Take by mouth.   02/22/2020 at Unknown time  . albuterol (PROVENTIL HFA;VENTOLIN HFA) 108 (90 Base) MCG/ACT inhaler Inhale into the lungs every 6 (six) hours as needed for wheezing or shortness of breath.     . EPINEPHrine 0.3 mg/0.3 mL IJ SOAJ injection Inject into the muscle.     . Insulin Human (INSULIN PUMP) SOLN Inject into the skin.     Marland Kitchen insulin lispro (HUMALOG) 100 UNIT/ML injection Inject into the skin. For insulin pump     . montelukast (SINGULAIR) 10 MG tablet Take 1 tablet (10 mg total) by mouth daily. 90 tablet 3   . NON FORMULARY Continuous glucose monitor        Review of Systems   All systems reviewed and negative except as stated in HPI  Blood pressure (!) 148/80, pulse 64, temperature 98 F (36.7  C), temperature source Oral, resp. rate 18, height 5\' 2"  (1.575 m), weight 65.7 kg, last menstrual period 06/02/2019. General appearance: alert, cooperative and appears stated age Lungs: normal effort Heart: regular rate  Abdomen: soft, non-tender; bowel sounds normal Pelvic: gravid  uterus GU: No vaginal lesions  Extremities: Homans sign is negative, no sign of DVT Presentation: cephalic Fetal monitoringBaseline: 145 bpm, Variability: Good {> 6 bpm), Accelerations: Reactive and Decelerations: Absent Uterine activity: Frequency: Every 8 minutes Dilation: Fingertip Effacement (%): 50 Station: -3, Ballotable Exam by:: Dr. Marice Potter    Prenatal labs: ABO, Rh: B/Positive/-- (08/10 1338) Antibody: Negative (08/10 1338) Rubella:   RPR: Non Reactive (01/07 0912)  HBsAg: Negative (08/10 1338)  HIV: Non Reactive (01/07 0912)  GBS: Negative/-- (03/03 1057)  Genetic screening  Low risk Anatomy US with fetal chest mass  Prenatal Transfer Tool  Maternal Diabetes: Yes:  Diabetes Type:  Pre-pregnancy Genetic Screening: Normal Maternal Ultrasounds/Referrals: Fetal chest mass; CPAM Fetal Ultrasounds or other Referrals:  Fetal echo WNL Maternal Substance Abuse:  No Significant Maternal Medications:  None Significant Maternal Lab Results: Group B Strep negative  Results for orders placed or performed during the hospital encounter of 02/23/20 (from the past 24 hour(s))  CBC   Collection Time: 02/23/20 12:47 AM  Result Value Ref Range   WBC 8.0 4.0 - 10.5 K/uL   RBC 3.94 3.87 - 5.11 MIL/uL   Hemoglobin 12.3 12.0 - 15.0 g/dL   HCT 35.9 (L) 36.0 - 46.0 %   MCV 91.1 80.0 - 100.0 fL   MCH 31.2 26.0 - 34.0 pg   MCHC 34.3 30.0 - 36.0 g/dL   RDW 13.4 11.5 - 15.5 %   Platelets 254 150 - 400 K/uL   nRBC 0.0 0.0 - 0.2 %  Glucose, capillary   Collection Time: 02/23/20 12:55 AM  Result Value Ref Range   Glucose-Capillary 81 70 - 99 mg/dL    Patient Active Problem List   Diagnosis Date Noted  .  Indication for care or intervention in labor or delivery 02/23/2020  . Third trimester pregnancy 02/22/2020  . Allergies 02/22/2020  . Fetal CPAM   . Type 1 diabetes mellitus in pregnancy 11/23/2019  . Hashimoto's thyroiditis 11/23/2019  . Supervision of high-risk pregnancy 11/23/2019  . Mild asthma without complication 73/22/0254  . Gastroesophageal reflux disease without esophagitis 08/13/2019    Assessment/Plan:  Nicole Carrillo is a 34 y.o. G1P0 at [redacted]w[redacted]d here for IOL for T1DM.  #Labor: Vertex by BSUS and exam. Discussed induction options. Will start with Cytotec. Patient is open to FB and will reassess at next exam if she desires placement. AROM/Pit as indicated. Anticipate SVD. #Pain: Per patient request #FWB: Cat I; EFW: 3500g #ID:  GBS neg #MOF: Breast #MOC: Condoms #Circ:  Inpatient  #T1DM: Glucose 81 on admission. Patient to continue insulin pump use at this time. Should readings be > 120 x2 or if patient no longer desires to manage the pump, will turn off and start EndoTool. Carb modified diet okay while on Cytotec. Will check sugars q4h latent, q2h active labor if not on EndoTool. If eating, will check 2 hour post-prandial and determine when next check needs to be based on that level.  #Hashimoto's: currently not on any medications and no issues   Barrington Ellison, MD [facility] Fellow, [facility] for [facility] [place] [facility] 02/23/2020, 1:27 AM

## 2020-02-23 NOTE — Progress Notes (Signed)
Labor Progress Note Nicole Carrillo is a 34 y.o. G1P0 at [redacted]w[redacted]d presented for IOL for T2DM. S: Feeling cramping. Agreeable to foley balloon.   O:  BP (!) 154/74   Pulse 61   Temp 98 F (36.7 C) (Oral)   Resp 16   Ht 5\' 2"  (1.575 m)   Wt 65.7 kg   LMP 06/02/2019 (Exact Date)   BMI 26.48 kg/m  EFM: 145, moderate variability, no accels, intermittent late decels (<50% ctx) TOCO: q2-72m   CVE: Dilation: 1.5 Effacement (%): 50 Cervical Position: Posterior Station: -3 Presentation: Vertex Exam by:: Dr. [email or web address]    A&P: 34 y.o. G1P0 [redacted]w[redacted]d here for IOL for T2DM. #Labor: Progressing well. S/p Cytotec x1. Foley bulb placed with speculum and filled with 60 mL water; patient tolerated well. Will start Pitocin due to Cat II tracing, max 6 while FB in place. Anticipate SVD.  #Pain: per patient request #FWB: Cat II; reassuring for moderate variability  #GBS negative #T1DM: Patient to continue insulin pump use at this time. Should readings be > 120 x2 or if patient no longer desires to manage the pump, will turn off and start EndoTool. Will check q4h latent labor. Last glucose 108. #Elevated BP's: Pre-E labs ordered. Asymptomatic. gHTN vs Pre-E  [redacted]w[redacted]d, MD 5:42 AM

## 2020-02-23 NOTE — Progress Notes (Signed)
Inpatient Diabetes Program Recommendations  Diabetes Treatment Program Recommendations  ADA Standards of Care Diabetes in Pregnancy Target Glucose Ranges:  Fasting: 60 - 90 mg/dL Preprandial: 60 - 829 mg/dL 1 hr postprandial: Less than 140mg /dL (from first bite of meal) 2 hr postprandial: Less than 120 mg/dL (from first bite of meal)    Results for HUGH, GARROW (MRN [name]) as of 02/23/2020 09:08  Ref. Range 02/23/2020 00:55 02/23/2020 04:17 02/23/2020 08:32  Glucose-Capillary Latest Ref Range: 70 - 99 mg/dL 81 02/25/2020 (H) 79   Review of Glycemic Control  Diabetes history: DM1 (makes NO insulin; requires basal, correction, and carb coverage insulin) Outpatient Diabetes medications: Insulin Pump with Humalog Current orders for Inpatient glycemic control: Insulin Pump  Inpatient Diabetes Program Recommendations:   Insulin Pump: Patient is currently using Insulin Pump for inpatient glycemic control. Please order Insulin Pump order set while patient is using her insulin pump.  NOTE: Spoke with patient regarding DM control and insulin pump. Patient goes to [facility] for DM management and she works with [facility], RD, Diabetes Educator with [facility] for pump adjustments. Patient had televisit with [facility] on 02/22/20 and per chart, current insulin pump settings are:  Basal rates - 12 a.m. 0.35u/h 4 am 0.8 u/h 7 am 0.90 u/h 12 pm .0,55u/h 6pm 0.6 u/h TBD = 15.2 units  Insulin to carbohydrate ratios - 12 a.m. 1u:8 g 7 am 1u:5.4 g 11 am 1u:7g Insulin sensitivity factor - 12 a.m. 40 Active insulin duration = 3:30 hours  Patient states that she was given pump setting adjustments for after delivery which are as follows: Basal rates 12A 0.25 units/hr 4A 0.30 units/hr 7A 0.45 units/hr 12P 0.35 units/hr 6P  0.45 units/hr Total Basal in 24 hours: 8.95 units Insulin to Carb Ratio: 1 unit for every 10 grams of carbs Insulin Sensitivity Factor: 1 unit drops glucose  60 mg/dl Active insulin time: 02/24/20 hours  Discussed insulin changes during active labor and follow delivery. Discussed transition to IV insulin with active labor and transitioning back to her insulin pump following delivery on postpartum settings. Patient reports she is using a continuous glucose monitoring sensor and she has extra insulin pump supplies at bedside if needed. Informed patient that our team will follow along and if she feels like she needs an advocate for inpatient glycemic control at any time to ask RD to page our team. Patient verbalized understanding of information and has no questions or concerns at this time related to DM or insulin pump.  Thanks, 6:96, RN, MSN, CDE Diabetes Coordinator Inpatient Diabetes Program [phone] (Team Pager from 8am to 5pm)

## 2020-02-23 NOTE — Progress Notes (Signed)
LABOR PROGRESS NOTE  Nicole Carrillo is a 34 y.o. G1P0 at [redacted]w[redacted]d  admitted for IOL for T1DM and PreE w/o SF.  Subjective: Comfortable Not really feeling contractions  Objective: BP 121/72   Pulse 62   Temp 98.9 F (37.2 C) (Oral)   Resp 15   Ht 5\' 2"  (1.575 m)   Wt 65.7 kg   LMP 06/02/2019 (Exact Date)   SpO2 99%   BMI 26.48 kg/m  or  Vitals:   02/23/20 1631 02/23/20 1701 02/23/20 1731 02/23/20 1801  BP: 125/61 122/64 124/65 121/72  Pulse: (!) 55 (!) 55 61 62  Resp: 15 15 15 15   Temp:  98.9 F (37.2 C)    TempSrc:  Oral    SpO2:      Weight:      Height:         Dilation: 6 Effacement (%): 70 Cervical Position: Anterior Station: -1, 0 Presentation: Vertex Exam by:: Berkeley Veldman FHT: baseline rate 130, moderate varibility, -acel, -decel Toco: q3-4 min  Labs: Lab Results  Component Value Date   WBC 8.0 02/23/2020   HGB 12.3 02/23/2020   HCT 35.9 (L) 02/23/2020   MCV 91.1 02/23/2020   PLT 254 02/23/2020    Patient Active Problem List   Diagnosis Date Noted  . Indication for care or intervention in labor or delivery 02/23/2020  . Third trimester pregnancy 02/22/2020  . Allergies 02/22/2020  . Fetal CPAM   . Type 1 diabetes mellitus in pregnancy 11/23/2019  . Hashimoto's thyroiditis 11/23/2019  . Supervision of high-risk pregnancy 11/23/2019  . Mild asthma without complication 08/13/2019  . Gastroesophageal reflux disease without esophagitis 08/13/2019    Assessment / Plan: 34 y.o. G1P0 at [redacted]w[redacted]d here for IOL for T1DM and PreE w/o SF.  Labor: s/p miso x1, FB, SROM. Pit started for period of time but discontinued due to recurrent lates and variables, started amnioinfusion and started supplemental O2 with improvement in strip. Restarted pitocin at 1730, recheck once has had period of adequate contractions. Fetal Wellbeing:  Cat I Pain Control:  epidural GBS: neg Anticipated MOD:  SVD  T1DM: switched to endotool, good control (82>117>121>99)  PreE w/o SF:  asymptomatic and BP's mostly mild range to normal, BP's have intermittently been severe but return quickly to normal, ctm closely for signs of severe disease  [redacted]w[redacted]d, MD/MPH OB Fellow  02/23/2020, 6:13 PM

## 2020-02-23 NOTE — Progress Notes (Signed)
LABOR PROGRESS NOTE  Nicole Carrillo is a 34 y.o. G1P0 at [redacted]w[redacted]d  admitted for IOL for T1DM and PreE w/o SF.  Subjective: Feeling some cramps but overall comfortable  FB still in place Denies HA, vision changes, chest pain, SOB  Objective: BP 130/73   Pulse (!) 50   Temp 98 F (36.7 C) (Oral)   Resp 15   Ht 5\' 2"  (1.575 m)   Wt 65.7 kg   LMP 06/02/2019 (Exact Date)   BMI 26.48 kg/m  or  Vitals:   02/23/20 0648 02/23/20 0800 02/23/20 0900 02/23/20 1030  BP: (!) 131/59 134/68 (!) 142/80 130/73  Pulse: (!) 56 (!) 54 (!) 54 (!) 50  Resp: 18 15 15 15   Temp:      TempSrc:      Weight:      Height:         Dilation: 1.5 Effacement (%): 50 Cervical Position: Posterior Station: -3 Presentation: Vertex Exam by:: Dr.  FHT: baseline rate 140, moderate varibility, -acel, variable and late decel Toco: irregular  Labs: Lab Results  Component Value Date   WBC 8.0 02/23/2020   HGB 12.3 02/23/2020   HCT 35.9 (L) 02/23/2020   MCV 91.1 02/23/2020   PLT 254 02/23/2020    Patient Active Problem List   Diagnosis Date Noted  . Indication for care or intervention in labor or delivery 02/23/2020  . Third trimester pregnancy 02/22/2020  . Allergies 02/22/2020  . Fetal CPAM   . Type 1 diabetes mellitus in pregnancy 11/23/2019  . Hashimoto's thyroiditis 11/23/2019  . Supervision of high-risk pregnancy 11/23/2019  . Mild asthma without complication 08/13/2019  . Gastroesophageal reflux disease without esophagitis 08/13/2019    Assessment / Plan: 34 y.o. G1P0 at [redacted]w[redacted]d here for IOL for T1DM and PreE w/o SF.  Labor: s/p miso x1, at 0530 given FB and pit as she had recurrent lates with misoprostol. FB still in place, cont with pit though may have to d/c as starting to have some lates again. Fetal Wellbeing:  Cat II for lates and variables which have started to appear more recently, prior to that was not having decels. Will watch closely, consider d/c pitocin pending course but  overall still reassuring w moderate variability.  Pain Control:  IV pain meds PRN, epidural upon request GBS: neg Anticipated MOD:  SVD  T1DM: using own insulin pump at present, has been at goal (81>79), low threshold for endotool  PreE w/o SF: asymptomatic and BP's mild range, ctm closely for signs of severe disease  32, MD/MPH OB Fellow  02/23/2020, 11:05 AM

## 2020-02-24 ENCOUNTER — Encounter: Payer: Managed Care, Other (non HMO) | Admitting: Obstetrics and Gynecology

## 2020-02-24 ENCOUNTER — Encounter (HOSPITAL_COMMUNITY): Payer: Self-pay | Admitting: Family Medicine

## 2020-02-24 DIAGNOSIS — Z3A38 38 weeks gestation of pregnancy: Secondary | ICD-10-CM

## 2020-02-24 DIAGNOSIS — O24013 Pre-existing diabetes mellitus, type 1, in pregnancy, third trimester: Secondary | ICD-10-CM

## 2020-02-24 DIAGNOSIS — O1490 Unspecified pre-eclampsia, unspecified trimester: Secondary | ICD-10-CM | POA: Insufficient documentation

## 2020-02-24 DIAGNOSIS — O4202 Full-term premature rupture of membranes, onset of labor within 24 hours of rupture: Secondary | ICD-10-CM

## 2020-02-24 HISTORY — DX: Unspecified pre-eclampsia, unspecified trimester: O14.90

## 2020-02-24 LAB — GLUCOSE, CAPILLARY
Glucose-Capillary: 108 mg/dL — ABNORMAL HIGH (ref 70–99)
Glucose-Capillary: 122 mg/dL — ABNORMAL HIGH (ref 70–99)
Glucose-Capillary: 126 mg/dL — ABNORMAL HIGH (ref 70–99)
Glucose-Capillary: 136 mg/dL — ABNORMAL HIGH (ref 70–99)
Glucose-Capillary: 142 mg/dL — ABNORMAL HIGH (ref 70–99)
Glucose-Capillary: 145 mg/dL — ABNORMAL HIGH (ref 70–99)
Glucose-Capillary: 151 mg/dL — ABNORMAL HIGH (ref 70–99)
Glucose-Capillary: 151 mg/dL — ABNORMAL HIGH (ref 70–99)
Glucose-Capillary: 153 mg/dL — ABNORMAL HIGH (ref 70–99)

## 2020-02-24 LAB — CBC
HCT: 35.2 % — ABNORMAL LOW (ref 36.0–46.0)
Hemoglobin: 11.8 g/dL — ABNORMAL LOW (ref 12.0–15.0)
MCH: 31.1 pg (ref 26.0–34.0)
MCHC: 33.5 g/dL (ref 30.0–36.0)
MCV: 92.6 fL (ref 80.0–100.0)
Platelets: 224 10*3/uL (ref 150–400)
RBC: 3.8 MIL/uL — ABNORMAL LOW (ref 3.87–5.11)
RDW: 13.6 % (ref 11.5–15.5)
WBC: 19.8 10*3/uL — ABNORMAL HIGH (ref 4.0–10.5)
nRBC: 0 % (ref 0.0–0.2)

## 2020-02-24 MED ORDER — SENNOSIDES-DOCUSATE SODIUM 8.6-50 MG PO TABS: 2.0000 | ORAL_TABLET | ORAL | Status: DC

## 2020-02-24 MED ORDER — LORATADINE 10 MG PO TABS: 10.0000 mg | ORAL_TABLET | Freq: Every day | ORAL | Status: DC

## 2020-02-24 MED ORDER — COCONUT OIL OIL: 1.0000 "application " | TOPICAL_OIL | Status: DC | PRN

## 2020-02-24 MED ORDER — BENZOCAINE-MENTHOL 20-0.5 % EX AERO
1.0000 "application " | INHALATION_SPRAY | CUTANEOUS | Status: DC | PRN
  Filled 2020-02-24 (×2): qty 56

## 2020-02-24 MED ORDER — MONTELUKAST SODIUM 10 MG PO TABS
10.0000 mg | ORAL_TABLET | Freq: Every day | ORAL | Status: DC
  Administered 2020-02-24 – 2020-02-26 (×3): 10 mg via ORAL
  Filled 2020-02-24 (×3): qty 1

## 2020-02-24 MED ORDER — INSULIN PUMP
Freq: Three times a day (TID) | SUBCUTANEOUS | Status: DC
  Administered 2020-02-24 (×2): 0.45 via SUBCUTANEOUS
  Administered 2020-02-25: 0.25 via SUBCUTANEOUS
  Filled 2020-02-24: qty 1

## 2020-02-24 MED ORDER — ONDANSETRON HCL 4 MG PO TABS: 4.0000 mg | ORAL_TABLET | ORAL | Status: DC | PRN

## 2020-02-24 MED ORDER — NON FORMULARY: 180.0000 mg | Freq: Every day | Status: DC

## 2020-02-24 MED ORDER — CETIRIZINE HCL 10 MG PO TABS
10.0000 mg | ORAL_TABLET | Freq: Every day | ORAL | Status: DC
  Administered 2020-02-24 – 2020-02-26 (×2): 10 mg via ORAL
  Filled 2020-02-24 (×3): qty 1

## 2020-02-24 MED ORDER — TETANUS-DIPHTH-ACELL PERTUSSIS 5-2.5-18.5 LF-MCG/0.5 IM SUSP: 0.5000 mL | Freq: Once | INTRAMUSCULAR | Status: DC

## 2020-02-24 MED ORDER — WITCH HAZEL-GLYCERIN EX PADS: 1.0000 "application " | MEDICATED_PAD | CUTANEOUS | Status: DC | PRN

## 2020-02-24 MED ORDER — DIPHENHYDRAMINE HCL 25 MG PO CAPS: 25.0000 mg | ORAL_CAPSULE | Freq: Four times a day (QID) | ORAL | Status: DC | PRN

## 2020-02-24 MED ORDER — DOCUSATE SODIUM 100 MG PO CAPS
100.0000 mg | ORAL_CAPSULE | Freq: Two times a day (BID) | ORAL | Status: DC
  Administered 2020-02-24 – 2020-02-26 (×5): 100 mg via ORAL
  Filled 2020-02-24 (×5): qty 1

## 2020-02-24 MED ORDER — IBUPROFEN 600 MG PO TABS
600.0000 mg | ORAL_TABLET | Freq: Four times a day (QID) | ORAL | Status: DC
  Administered 2020-02-24 – 2020-02-26 (×7): 600 mg via ORAL
  Filled 2020-02-24 (×7): qty 1

## 2020-02-24 MED ORDER — PRENATAL MULTIVITAMIN CH
1.0000 | ORAL_TABLET | Freq: Every day | ORAL | Status: DC
  Administered 2020-02-24 – 2020-02-26 (×3): 1 via ORAL
  Filled 2020-02-24 (×3): qty 1

## 2020-02-24 MED ORDER — ONDANSETRON HCL 4 MG/2ML IJ SOLN: 4.0000 mg | INTRAMUSCULAR | Status: DC | PRN

## 2020-02-24 MED ORDER — FEXOFENADINE HCL 60 MG PO TABS
180.0000 mg | ORAL_TABLET | Freq: Every day | ORAL | Status: DC
  Administered 2020-02-24 – 2020-02-25 (×2): 180 mg via ORAL
  Filled 2020-02-24 (×3): qty 3
  Filled 2020-02-24: qty 1

## 2020-02-24 MED ORDER — SIMETHICONE 80 MG PO CHEW: 80.0000 mg | CHEWABLE_TABLET | ORAL | Status: DC | PRN

## 2020-02-24 MED ORDER — DIBUCAINE (PERIANAL) 1 % EX OINT: 1.0000 "application " | TOPICAL_OINTMENT | CUTANEOUS | Status: DC | PRN

## 2020-02-24 MED ORDER — ACETAMINOPHEN 325 MG PO TABS: 650.0000 mg | ORAL_TABLET | ORAL | Status: DC | PRN

## 2020-02-24 NOTE — Lactation Note (Signed)
This note was copied from a baby's chart. Lactation Consultation Note  Patient Name: Nicole Carrillo DTOIZ'T Date: 02/24/2020 Reason for consult: Initial assessment;Early term 37-38.6wks;Primapara;1st time breastfeeding   P1 mother whose infant is now 73 hours old.  This is an ETI at 38+1 weeks.  Mother's feeding preference on admission is breast/bottle.  Baby was asleep in the bassinet when I arrived and mother was eating lunch.  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  Answered a few questions that mother had regarding breast feeding.  Discussed her "ETI" and suggested she awaken her son for feedings every three hours if he does not self awaken.  He has breast fed and been given formula for supplementation since delivery.  Mother feels like he latched and fed well already.  She will observe for feeding cues and continue to practice hand expression before/after feedings.  She has only seen a small drop of colostrum from one breast so far.  Encouraged to continue massaging breasts prior to hand expression.  Mother stated, "I don't have anything yet."  However, I did remind her that she does have colostrum.  It may take some time to express it but it is available.  Mother was interested in beginning to pump using her own personal DEBP.  I encouraged this and asked her to pump for 15 minutes after latching.  Informed her that I could not assist with her personal pump but I would be happy to assist with the hospital grade pump.  Mother still prefers to use her own pump.  Colostrum container provided and milk storage times reviewed.  Finger feeding demonstrated.  Mother will feed back any EBM she obtains to baby.  She will call her RN/LC for latch assistance as needed.  Provided coconut oil per mother's request.  Mom made aware of O/P services, breastfeeding support groups, community resources, and our phone # for post-discharge questions. Father present.  RN updated.      Maternal  Data Formula Feeding for Exclusion: No Has patient been taught Hand Expression?: Yes Does the patient have breastfeeding experience prior to this delivery?: No  Feeding Feeding Type: Breast Fed  LATCH Score Latch: Grasps breast easily, tongue down, lips flanged, rhythmical sucking.  Audible Swallowing: Spontaneous and intermittent  Type of Nipple: Everted at rest and after stimulation  Comfort (Breast/Nipple): Soft / non-tender  Hold (Positioning): Assistance needed to correctly position infant at breast and maintain latch.  LATCH Score: 9  Interventions Interventions: Assisted with latch;Skin to skin;Hand express  Lactation Tools Discussed/Used WIC Program: No   Consult Status Consult Status: Follow-up Date: 02/25/20 Follow-up type: In-patient    Nicole Carrillo 02/24/2020, 3:02 PM

## 2020-02-24 NOTE — Anesthesia Postprocedure Evaluation (Signed)
Anesthesia Post Note  Patient: Nicole Carrillo  Procedure(s) Performed: AN AD HOC LABOR EPIDURAL     Patient location during evaluation: Mother Baby Anesthesia Type: Epidural Level of consciousness: awake and alert and oriented Pain management: satisfactory to patient Vital Signs Assessment: post-procedure vital signs reviewed and stable Respiratory status: respiratory function stable Cardiovascular status: stable Postop Assessment: no headache, no backache, epidural receding, patient able to bend at knees, no signs of nausea or vomiting and adequate PO intake Anesthetic complications: no    Last Vitals:  Vitals:   02/24/20 0623 02/24/20 0655  BP:  135/77  Pulse:  83  Resp:  18  Temp: 37.1 C 37 C  SpO2:  100%    Last Pain:  Vitals:   02/24/20 0655  TempSrc: Axillary  PainSc: 0-No pain   Pain Goal:                   [name]

## 2020-02-24 NOTE — Progress Notes (Signed)
LABOR PROGRESS NOTE  Nicole Carrillo is a 34 y.o. G1P0 at [redacted]w[redacted]d  admitted for IOL for T1DM and PreE w/o SF.  Subjective: Comfortable with epidural. Not feeling pressure.   Objective: BP (!) 147/55   Pulse 79   Temp 100.3 F (37.9 C) (Oral)   Resp 17   Ht 5\' 2"  (1.575 m)   Wt 65.7 kg   LMP 06/02/2019 (Exact Date)   SpO2 99%   BMI 26.48 kg/m  or  Vitals:   02/23/20 2223 02/23/20 2300 02/23/20 2331 02/23/20 2340  BP: (!) 138/46 (!) 155/53 135/67 (!) 147/55  Pulse: 80 77 73 79  Resp: 17 16  17   Temp: 100.3 F (37.9 C)     TempSrc: Oral     SpO2:      Weight:      Height:         Dilation: 9 Effacement (%): 100 Cervical Position: Anterior Station: 0, Plus 1 Presentation: Vertex Exam by:: Dr. FHT: baseline rate 150, moderate varibility, -acel, intermittent late decels Toco: q3-6 min  Labs: Lab Results  Component Value Date   WBC 8.0 02/23/2020   HGB 12.3 02/23/2020   HCT 35.9 (L) 02/23/2020   MCV 91.1 02/23/2020   PLT 254 02/23/2020    Patient Active Problem List   Diagnosis Date Noted  . Indication for care or intervention in labor or delivery 02/23/2020  . Third trimester pregnancy 02/22/2020  . Allergies 02/22/2020  . Fetal CPAM   . Type 1 diabetes mellitus in pregnancy 11/23/2019  . Hashimoto's thyroiditis 11/23/2019  . Supervision of high-risk pregnancy 11/23/2019  . Mild asthma without complication 08/13/2019  . Gastroesophageal reflux disease without esophagitis 08/13/2019    Assessment / Plan: 34 y.o. G1P0 at [redacted]w[redacted]d here for IOL for T1DM and PreE w/o SF.  Labor: s/p Cyto x1, FB, SROM. Cervix unchanged at this time. Cont Pit titration to adequate MVU's as ctx have spaced out. Anticipate SVD. Fetal Wellbeing:  Cat II; reassuring for moderate variability  Pain Control:  epidural GBS: neg Anticipated MOD:  SVD  T1DM: On EndoTool with continued good control. Last glucose 137; repeat pending.   PreE w/o SF: asymptomatic and BP's mostly mild  range to normal, BP's have intermittently been severe but return quickly to normal, ctm closely for signs of severe disease  32, MD [facility] Fellow, [facility] for [facility], [facility] 02/24/2020, 12:18 AM

## 2020-02-24 NOTE — Discharge Summary (Signed)
Postpartum Discharge Summary     Patient Name: Nicole Carrillo DOB: 23-Sep-1986 MRN: [id number]  Date of admission: 02/23/2020 Delivering Provider: Chauncey Mann   Date of discharge: 02/26/2020  Admitting diagnosis: Indication for care or intervention in labor or delivery [O75.9] Intrauterine pregnancy: [redacted]w[redacted]d    Secondary diagnosis:  Active Problems:   Type 1 diabetes mellitus in pregnancy   Hashimoto's thyroiditis   Supervision of high-risk pregnancy   Fetal CPAM   Mild asthma without complication   Gastroesophageal reflux disease without esophagitis   Indication for care or intervention in labor or delivery   Preeclampsia  Additional problems: None     Discharge diagnosis: Term Pregnancy Delivered, Preeclampsia (mild) and Type 1 DM                                                                                                Post partum procedures:None  Augmentation: Pitocin, Cytotec and Foley Balloon  Complications: None  Hospital course:  Induction of Labor With Vaginal Delivery   34y.o. yo G1P0 at 329w1das admitted to the hospital 02/23/2020 for induction of labor.  Indication for induction: Type 1 DM.  Patient had an uncomplicated labor course as follows: Initial SVE: 0.5/thick/ballotable. Patient received Cytotec, Foley balloon, Pitocin. SROM occurred. Received epidural. Labor course complicated by intermittent Cat II tracing (late decels) throughout induction. She was also diagnosed with Pre-E without severe features intra-partum. EndoTool was initiated once patient neared active labor and patient's insulin pump turned off. Delivery complicated by 30 second shoulder dystocia; otherwise uncomplicated.  Membrane Rupture Time/Date: 11:25 AM ,02/23/2020   Intrapartum Procedures: Episiotomy: None [1]                                         Lacerations:  1st degree [2]  Patient had delivery of a Viable infant.  Information for the patient's newborn:  BoValentina, Alcoser[[id number]]Delivery Method: Vaginal, Spontaneous(Filed from Delivery Summary)    02/24/2020  Details of delivery can be found in separate delivery note. Patient resumed home insulin pump at pre-pregnancy values following delivery and insulin drip was weaned off within about 2 hours. Glucose levels post-partum was well-controlled. BP's post-partum were mild-range; Enalapril 2.5 mg initiated and patient to have outpatient BP check. Patient had a routine postpartum course. Patient is discharged home 02/26/20. Delivery time: 3:36 AM    Magnesium Sulfate received: No BMZ received: No Rhophylac:No MMR:No Transfusion:No  Physical exam  Vitals:   02/25/20 1356 02/25/20 1945 02/26/20 0500 02/26/20 1136  BP: 133/88 135/82 (!) 142/83 138/86  Pulse: 80 88 87   Resp: '16 16 18   ' Temp: 98.3 F (36.8 C) 97.9 F (36.6 C) 98.5 F (36.9 C)   TempSrc: Oral Oral    SpO2: 99% 100% 99%   Weight:      Height:       General: alert, cooperative and no distress Lochia: appropriate Uterine Fundus: firm Incision: N/A DVT Evaluation: No evidence of DVT  seen on physical exam. Labs: Lab Results  Component Value Date   WBC 19.8 (H) 02/24/2020   HGB 11.8 (L) 02/24/2020   HCT 35.2 (L) 02/24/2020   MCV 92.6 02/24/2020   PLT 224 02/24/2020   CMP Latest Ref Rng & Units 02/23/2020  Glucose 70 - 99 mg/dL 106(H)  BUN 6 - 20 mg/dL 9  Creatinine 0.44 - 1.00 mg/dL 0.67  Sodium 135 - 145 mmol/L 137  Potassium 3.5 - 5.1 mmol/L 3.8  Chloride 98 - 111 mmol/L 106  CO2 22 - 32 mmol/L 21(L)  Calcium 8.9 - 10.3 mg/dL 9.0  Total Protein 6.5 - 8.1 g/dL 6.1(L)  Total Bilirubin 0.3 - 1.2 mg/dL 0.6  Alkaline Phos 38 - 126 U/L 75  AST 15 - 41 U/L 22  ALT 0 - 44 U/L 18   Edinburgh Score: Edinburgh Postnatal Depression Scale Screening Tool 02/25/2020  I have been able to laugh and see the funny side of things. 0  I have looked forward with enjoyment to things. 0  I have blamed myself unnecessarily when things went  wrong. 0  I have been anxious or worried for no good reason. 0  I have felt scared or panicky for no good reason. 0  Things have been getting on top of me. 0  I have been so unhappy that I have had difficulty sleeping. 0  I have felt sad or miserable. 0  I have been so unhappy that I have been crying. 0  The thought of harming myself has occurred to me. 0  Edinburgh Postnatal Depression Scale Total 0    Discharge instruction: per After Visit Summary and "Baby and Me Booklet".  After visit meds:  Allergies as of 02/26/2020      Reactions   Shellfish Allergy Shortness Of Breath, Rash   Crab causes SOB, Scallops cause rash.  Can eat Shrimp and lobster.   Acetaminophen Other (See Comments)   Effects blood sugars and insulin pump.   Cephalexin Nausea Only, Other (See Comments)   Headaches, dizziness   Pneumococcal Vaccines Swelling   Local injection swelling   Prednisone Nausea Only, Other (See Comments)   Headaches, dizziness, Prefers not to take because of uncontrollable BG's on this med;    Soy Allergy Other (See Comments)   Blood test   Adhesive [tape] Rash   With extended exposure   Peanut-containing Drug Products Rash      Medication List    STOP taking these medications   aspirin EC 81 MG tablet   docusate sodium 100 MG capsule Commonly known as: COLACE     TAKE these medications   acetaminophen 325 MG tablet Commonly known as: Tylenol Take 2 tablets (650 mg total) by mouth every 6 (six) hours as needed (for pain scale < 4).   albuterol 108 (90 Base) MCG/ACT inhaler Commonly known as: VENTOLIN HFA Inhale into the lungs every 6 (six) hours as needed for wheezing or shortness of breath.   cetirizine 10 MG tablet Commonly known as: ZYRTEC Take 10 mg by mouth daily.   enalapril 2.5 MG tablet Commonly known as: VASOTEC Take 1 tablet (2.5 mg total) by mouth daily.   EPINEPHrine 0.3 mg/0.3 mL Soaj injection Commonly known as: EPI-PEN Inject into the muscle.    fexofenadine 180 MG tablet Commonly known as: ALLEGRA Take 180 mg by mouth at bedtime.   ibuprofen 600 MG tablet Commonly known as: ADVIL Take 1 tablet (600 mg total) by mouth every 6 (six)  hours as needed.   insulin pump Soln Inject into the skin continuous. Humalog 100 units/mL   montelukast 10 MG tablet Commonly known as: SINGULAIR Take 1 tablet (10 mg total) by mouth daily.   NON FORMULARY Continuous glucose monitor   prenatal multivitamin Tabs tablet Take 1 tablet by mouth daily at 12 noon.   psyllium 28 % packet Commonly known as: METAMUCIL SMOOTH TEXTURE Take 1 packet by mouth daily.   senna-docusate 8.6-50 MG tablet Commonly known as: Senokot-S Take 1 tablet by mouth daily.       Diet: routine diet  Activity: Advance as tolerated. Pelvic rest for 6 weeks.   Outpatient follow up:4 weeks Follow up Appt: Future Appointments  Date Time Provider [place]  03/07/2020  2:00 PM [place]  03/28/2020  8:55 AM Nehemiah Settle, Tanna Savoy, DO [facility]   Follow up Visit:    Please schedule this patient for Postpartum visit in: 4 weeks with the following provider: MD Virtual For C/S patients schedule nurse incision check in weeks 2 weeks: no High risk pregnancy complicated by: Type 1 DM and Pre-E Delivery mode:  SVD Anticipated Birth Control:  Condoms PP Procedures needed: BP check, glucose readings  Schedule Integrated [place] visit: no    Newborn Data: Live born female  Birth Weight: 3504g  APGAR: 95, 9  Newborn Delivery   Birth date/time: 02/24/2020 03:36:00 Delivery type: Vaginal, Spontaneous      Baby Feeding: Breast Disposition:NICU   02/26/2020 Chauncey Mann, MD

## 2020-02-24 NOTE — Progress Notes (Signed)
Pt restarted personal insulin pump at 0430 with previously determined post partum settings given to her by [facility] diabetes coordinator.

## 2020-02-25 LAB — GLUCOSE, CAPILLARY: Glucose-Capillary: 262 mg/dL — ABNORMAL HIGH (ref 70–99)

## 2020-02-25 NOTE — Progress Notes (Addendum)
POSTPARTUM PROGRESS NOTE  Subjective: Nicole Carrillo is a 34 y.o. G1P1001 s/p VD at [redacted]w[redacted]d.  She reports she doing well. No acute events overnight. She denies any problems with ambulating, voiding or po intake. Denies nausea or vomiting. Pain is well controlled.   Objective: Blood pressure 130/77, pulse 86, temperature 98.7 F (37.1 C), temperature source Oral, resp. rate 16, height 5\' 2"  (1.575 m), weight 65.7 kg, last menstrual period 06/02/2019, SpO2 98 %, unknown if currently breastfeeding.  Physical Exam:  General: alert, cooperative and no distress Chest: no respiratory distress Abdomen: soft, non-tender  Uterine Fundus: firm, appropriately tender Extremities: No calf swelling or tenderness  Recent Labs    02/23/20 0047 02/24/20 0523  HGB 12.3 11.8*  HCT 35.9* 35.2*    Assessment/Plan: Nicole Carrillo is a 34 y.o. G1P1001 s/p VD at [redacted]w[redacted]d for IOL for T1DM.  Routine Postpartum Care: Doing well, pain well-controlled.  -- Continue routine care, lactation support  -- Contraception: Condoms -- Feeding: breast --T1DM: Most recent CBGs 151/145/262. Continue insulin pump.  Dispo: Plan for discharge likely tomorrow.   [redacted]w[redacted]d, DO Resident, [facility]

## 2020-02-25 NOTE — Discharge Instructions (Signed)

## 2020-02-25 NOTE — Progress Notes (Signed)
Patient screened out for psychosocial assessment since none of the following apply:  Psychosocial stressors documented in mother or baby's chart  Gestation less than 32 weeks  Code at delivery   Infant with anomalies Please contact the Clinical Social Worker if specific needs arise, by MOB's request, or if MOB scores greater than 9/yes to question 10 on Edinburgh Postpartum Depression Screen.  Britian Jentz, LCSW Clinical Social Worker [facility] Cell#: [phone]     

## 2020-02-25 NOTE — Plan of Care (Signed)
  Problem: Role Relationship: Goal: Ability to demonstrate positive interaction with newborn will improve Note: Infant is in the NICU. Patient has been visiting frequently. Patient has her own DEBP that she has been using. Discussed the importance of pumping regularly to increase milk supply. Earl Gala, [place]

## 2020-02-26 MED ORDER — ENALAPRIL MALEATE 5 MG PO TABS
2.5000 mg | ORAL_TABLET | Freq: Every day | ORAL | Status: DC
  Administered 2020-02-26: 2.5 mg via ORAL
  Filled 2020-02-26: qty 1

## 2020-02-26 MED ORDER — SENNOSIDES-DOCUSATE SODIUM 8.6-50 MG PO TABS: 1.0000 | ORAL_TABLET | Freq: Every day | ORAL | 0 refills | Status: DC

## 2020-02-26 MED ORDER — ENALAPRIL MALEATE 2.5 MG PO TABS: 2.5000 mg | ORAL_TABLET | Freq: Every day | ORAL | 0 refills | Status: DC

## 2020-02-26 MED ORDER — ACETAMINOPHEN 325 MG PO TABS: 650.0000 mg | ORAL_TABLET | Freq: Four times a day (QID) | ORAL | 0 refills | Status: DC | PRN

## 2020-02-26 MED ORDER — IBUPROFEN 600 MG PO TABS: 600.0000 mg | ORAL_TABLET | Freq: Four times a day (QID) | ORAL | 0 refills | Status: DC | PRN

## 2020-02-26 MED FILL — SENEXON-S 8.6-50 MG TABS: 8.6-50 | 30 days supply | Qty: 30 | Fill #0

## 2020-02-26 MED FILL — IBUPROFEN 600 MG TABLET: 600 | 8 days supply | Qty: 30 | Fill #0

## 2020-02-26 MED FILL — ACETAMINOPHEN 325 MG TABS: 325 | 4 days supply | Qty: 30 | Fill #0

## 2020-02-26 MED FILL — ENALAPRIL MALEATE 2.5 MG TA: 2.5 | 30 days supply | Qty: 30 | Fill #0

## 2020-02-26 NOTE — Lactation Note (Signed)
This note was copied from a baby's chart. Lactation Consultation Note  Patient Name: Nicole Carrillo [id number] Date: 02/26/2020   Baby 53 hours old in NICU.  Mother hx of DM. Mother is NICU RN (not here). Mother states baby is latching well. She is pumping with her personal Medela pump. She states she is getting drops with hand expression. Encouraged her to watch hands on pumping video to help increase her pumping supply. Referred her to Baby booklet for milk storage information. Reviewed engorgement care and monitoring voids/stools. Suggest mother call if she would like assistance w/ latching.      Maternal Data    Feeding Feeding Type: Formula Nipple Type: Nfant Slow Flow (purple)  LATCH Score                   Interventions    Lactation Tools Discussed/Used     Consult Status      Hardie Pulley 02/26/2020, 8:41 AM

## 2020-02-26 NOTE — Progress Notes (Signed)
Per order, insulin pump managed by patient who obtains own blood glucose.  Blood sugar was 249 at 2325.  Basal rate of 0.45 units adimistered by pt.  Also given 2.7 units of insulin bolus given for carb coverage after dessert.   Details of insulin administration noted in [facility] and reported to Felipa Eth, Charity fundraiser.   Elvia Collum, RN

## 2020-02-27 ENCOUNTER — Ambulatory Visit: Payer: Self-pay

## 2020-02-27 NOTE — Lactation Note (Signed)
This note was copied from a baby's chart. Lactation Consultation Note  Patient Name: Nicole Carrillo [id number] Date: 02/27/2020  Telephone call from RN that mom has questions.  Baby Nicole Lippens now 55 hours old.  Mom reports she feels he has been latching and nursing well but that she feels she could be engorged.  Mom using her own personal DEBP. Mom with Bilateral engorgement.  No redness, but painful, lumpy hard areas.  Mom reports infant ate about an hour prior.  Mom reports she does not get  Anything with pumping. But feels there is something there.  Assist with pumping with DEBP. Showed mom how to do Reverse Pressure Softening prior to pumping and add hand expression.  Left breast responded well to massage/hand expression and softened well during pumping.  Right breast still a little hard and lumpy but mom reports some relief. Urged mom to watch [employer] hands on pumping video to learn how to get more milk with her hands while pumping. Urged her to try and increase her pumpings during this time when not with baby and even after baby if he doesn't soften breasts.   Urged to call lactation as needed.   Maternal Data    Feeding    LATCH Score                   Interventions    Lactation Tools Discussed/Used     Consult Status      Nicole Carrillo 02/27/2020, 4:52 PM

## 2020-02-28 ENCOUNTER — Other Ambulatory Visit: Payer: Self-pay

## 2020-02-28 ENCOUNTER — Inpatient Hospital Stay (HOSPITAL_COMMUNITY)
Admission: AD | Admit: 2020-02-28 | Discharge: 2020-02-28 | Disposition: A | Discharge status: Home / Self Care | Payer: Managed Care, Other (non HMO) | Attending: Obstetrics and Gynecology | Admitting: Obstetrics and Gynecology

## 2020-02-28 ENCOUNTER — Encounter (HOSPITAL_COMMUNITY): Payer: Self-pay | Admitting: Obstetrics and Gynecology

## 2020-02-28 DIAGNOSIS — O1205 Gestational edema, complicating the puerperium: Secondary | ICD-10-CM | POA: Insufficient documentation

## 2020-02-28 DIAGNOSIS — R609 Edema, unspecified: Secondary | ICD-10-CM

## 2020-02-28 LAB — CBC
HCT: 30.2 % — ABNORMAL LOW (ref 36.0–46.0)
Hemoglobin: 10 g/dL — ABNORMAL LOW (ref 12.0–15.0)
MCH: 31 pg (ref 26.0–34.0)
MCHC: 33.1 g/dL (ref 30.0–36.0)
MCV: 93.5 fL (ref 80.0–100.0)
Platelets: 363 10*3/uL (ref 150–400)
RBC: 3.23 MIL/uL — ABNORMAL LOW (ref 3.87–5.11)
RDW: 13.7 % (ref 11.5–15.5)
WBC: 7.8 10*3/uL (ref 4.0–10.5)
nRBC: 0 % (ref 0.0–0.2)

## 2020-02-28 NOTE — MAU Note (Signed)
Nicole Carrillo is a 34 y.o. here in MAU reporting: SVD on 03/17, about an hour was having a BM and noticed 2 clots, one was about the size of an orange and the other was a little bit smaller. Was told to come in if she passes any clots larger than a golfball so she was concerned. States PP bleeding is much less, changing a pad every 4 hours and its not saturated. Also having some swelling in feet and legs. No pain.  Onset of complaint: today  Pain score: 0/10  Vitals:   02/28/20 1111  BP: 132/84  Pulse: 75  Resp: 17  Temp: 98.8 F (37.1 C)  SpO2: 99%     Lab orders placed from triage: none

## 2020-02-28 NOTE — Discharge Instructions (Signed)
For your Maternity Stockings:  [facility] [phone] @2301  [address] [place], [place]

## 2020-02-28 NOTE — MAU Provider Note (Signed)
Chief Complaint: Clots and Leg Swelling   First Provider Initiated Contact with Patient 02/28/20 1133      SUBJECTIVE HPI: Nicole Carrillo is a 34 y.o. G1P1001 with medical hx significant for T1DM on postpartum day 4 following vaginal delivery at [redacted]w[redacted]d with infant in NICU for hypoglycemia who presents to maternity admissions reporting large clots passed vaginally and BLE worsening today.  She reports bleeding has been moderate, with small clots, soaking 1 pad in several hours.  Then, while in NICU today, she had a bowel movement and passed a palm sized clot followed by another golf ball or larger sized clot.  After this episode, her bleeding returned to moderate amount similar to before.  She reports swelling of both feet/legs since delivery but worse today. She is spending a lot of time in NICU with her baby and is unable to put her feet up so thinks this is contributing.  She denies any h/a, epigastric pain or scotoma.  There are no other symptoms. She is taking enalapril 2.5 mg daily as prescribed.        HPI  Past Medical History:  Diagnosis Date  . Allergy   . Asthma   . Chicken pox   . Diabetes mellitus type 1 (HCC)   . GERD (gastroesophageal reflux disease)   . Hashimoto's thyroiditis   . History of migraine   . Insulin pump in place   . Orthodontics    permanent upper and lower retainers  . PVD (posterior vitreous detachment), right   . Wears contact lenses    Past Surgical History:  Procedure Laterality Date  . DENTAL SURGERY    . ENDOSCOPIC CONCHA BULLOSA RESECTION Bilateral 07/24/2018   Procedure: ENDOSCOPIC CONCHA BULLOSA MIDDLE TURBINATE;  Surgeon: Vernie Murders, MD;  Location: [facility];  Service: ENT;  Laterality: Bilateral;  Diabetic - insulin pump  . SEPTOPLASTY N/A 07/24/2018   Procedure: SEPTOPLASTY;  Surgeon: Vernie Murders, MD;  Location: [facility];  Service: ENT;  Laterality: N/A;  NEEDS TO BE FIRST INSULIN PUMP  . TURBINATE REDUCTION Bilateral  07/24/2018   Procedure: INFERIOR TURBINATE REDUCTION;  Surgeon: Vernie Murders, MD;  Location: [facility];  Service: ENT;  Laterality: Bilateral;  . WISDOM TOOTH EXTRACTION     Social History   Socioeconomic History  . Marital status: Married    Spouse name: Not on file  . Number of children: Not on file  . Years of education: Not on file  . Highest education level: Not on file  Occupational History  . Not on file  Tobacco Use  . Smoking status: Never Smoker  . Smokeless tobacco: Never Used  Substance and Sexual Activity  . Alcohol use: Not Currently    Alcohol/week: 1.0 standard drinks    Types: 1 Glasses of wine per week  . Drug use: Not Currently  . Sexual activity: Yes    Partners: Male    Birth control/protection: None  Other Topics Concern  . Not on file  Social History Narrative   Marital status/children/pets: Married. Soon to have 1 son.    Education/employment: MSN, Nurse.    Safety:      -smoke alarm in the home:Yes     - wears seatbelt: Yes     - Feels safe in their relationships: Yes   Social Determinants of Health   Financial Resource Strain:   . Difficulty of Paying Living Expenses:   Food Insecurity: No Food Insecurity  . Worried About [employer] of  Food in the Last Year: Never true  . Ran Out of Food in the Last Year: Never true  Transportation Needs: No Transportation Needs  . Lack of Transportation (Medical): No  . Lack of Transportation (Non-Medical): No  Physical Activity:   . Days of Exercise per Week:   . Minutes of Exercise per Session:   Stress:   . Feeling of Stress :   Social Connections:   . Frequency of Communication with Friends and Family:   . Frequency of Social Gatherings with Friends and Family:   . Attends Religious Services:   . Active Member of Clubs or Organizations:   . Attends Banker Meetings:   Marland Kitchen Marital Status:   Intimate Partner Violence:   . Fear of Current or Ex-Partner:   . Emotionally  Abused:   Marland Kitchen Physically Abused:   . Sexually Abused:    No current facility-administered medications on file prior to encounter.   Current Outpatient Medications on File Prior to Encounter  Medication Sig Dispense Refill  . acetaminophen (TYLENOL) 325 MG tablet Take 2 tablets (650 mg total) by mouth every 6 (six) hours as needed (for pain scale < 4). 30 tablet 0  . albuterol (PROVENTIL HFA;VENTOLIN HFA) 108 (90 Base) MCG/ACT inhaler Inhale into the lungs every 6 (six) hours as needed for wheezing or shortness of breath.    . cetirizine (ZYRTEC) 10 MG tablet Take 10 mg by mouth daily.    . enalapril (VASOTEC) 2.5 MG tablet Take 1 tablet (2.5 mg total) by mouth daily. 60 tablet 0  . EPINEPHrine 0.3 mg/0.3 mL IJ SOAJ injection Inject into the muscle.    . fexofenadine (ALLEGRA) 180 MG tablet Take 180 mg by mouth at bedtime.     Marland Kitchen ibuprofen (ADVIL) 600 MG tablet Take 1 tablet (600 mg total) by mouth every 6 (six) hours as needed. 30 tablet 0  . Insulin Human (INSULIN PUMP) SOLN Inject into the skin continuous. Humalog 100 units/mL    . montelukast (SINGULAIR) 10 MG tablet Take 1 tablet (10 mg total) by mouth daily. 90 tablet 3  . NON FORMULARY Continuous glucose monitor    . Prenatal Vit-Fe Fumarate-FA (PRENATAL MULTIVITAMIN) TABS tablet Take 1 tablet by mouth daily at 12 noon.    . psyllium (METAMUCIL SMOOTH TEXTURE) 28 % packet Take 1 packet by mouth daily.    Marland Kitchen senna-docusate (SENOKOT-S) 8.6-50 MG tablet Take 1 tablet by mouth daily. 30 tablet 0   Allergies  Allergen Reactions  . Shellfish Allergy Shortness Of Breath and Rash    Crab causes SOB, Scallops cause rash.  Can eat Shrimp and lobster.  . Acetaminophen Other (See Comments)    Effects blood sugars and insulin pump.  . Cephalexin Nausea Only and Other (See Comments)    Headaches, dizziness  . Pneumococcal Vaccines Swelling    Local injection swelling  . Prednisone Nausea Only and Other (See Comments)    Headaches, dizziness,  Prefers not to take because of uncontrollable BG's on this med;     Marland Kitchen Soy Allergy Other (See Comments)    Blood test  . Adhesive [Tape] Rash    With extended exposure  . Peanut-Containing Drug Products Rash    ROS:  Review of Systems  Constitutional: Negative for chills, fatigue and fever.  Respiratory: Negative for shortness of breath.   Cardiovascular: Positive for leg swelling. Negative for chest pain.  Genitourinary: Positive for vaginal bleeding. Negative for difficulty urinating, dysuria, flank pain, pelvic pain,  vaginal discharge and vaginal pain.  Neurological: Negative for dizziness and headaches.  Psychiatric/Behavioral: Negative.      I have reviewed patient's Past Medical Hx, Surgical Hx, Family Hx, Social Hx, medications and allergies.   Physical Exam   Patient Vitals for the past 24 hrs:  BP Temp Temp src Pulse Resp SpO2 Height Weight  02/28/20 1148 137/86 -- -- 78 17 99 % -- --  02/28/20 1111 132/84 98.8 F (37.1 C) Oral 75 17 99 % -- --  02/28/20 1105 -- -- -- -- -- -- 5\' 2"  (1.575 m) 58.7 kg   Constitutional: Well-developed, well-nourished female in no acute distress.  Cardiovascular: normal rate Respiratory: normal effort GI: Abd soft, non-tender. Pos BS x 4 MS: Extremities nontender, +2 pitting edema BLE, normal ROM Neurologic: Alert and oriented x 4.  GU: Neg CVAT.  Fundal exam: 3 below umbilicus or lower, firm, bleeding minimal on exam   LAB RESULTS Results for orders placed or performed during the hospital encounter of 02/28/20 (from the past 24 hour(s))  CBC     Status: Abnormal   Collection Time: 02/28/20 11:42 AM  Result Value Ref Range   WBC 7.8 4.0 - 10.5 K/uL   RBC 3.23 (L) 3.87 - 5.11 MIL/uL   Hemoglobin 10.0 (L) 12.0 - 15.0 g/dL   HCT 30.2 (L) 36.0 - 46.0 %   MCV 93.5 80.0 - 100.0 fL   MCH 31.0 26.0 - 34.0 pg   MCHC 33.1 30.0 - 36.0 g/dL   RDW 13.7 11.5 - 15.5 %   Platelets 363 150 - 400 K/uL   nRBC 0.0 0.0 - 0.2 %    --/--/B  POS, B POS Performed at [facility], [address], [place], [place] [address]  [phone])  IMAGING   MAU Management/MDM: Orders Placed This Encounter  Procedures  . CBC  . Discharge patient    No orders of the defined types were placed in this encounter.   Hgb stable since delivery, bleeding minimal on today's exam. Dependent edema without evidence of HTN. Rx for maternity support socks, pt to elevated feet as much as possible.   Reviewed bleeding precautions/reasons to return to MAU. F/U in office as scheduled.    ASSESSMENT 1. Postpartum hemorrhage, delayed (> 24 hrs), postpartum condition   2. Dependent edema     PLAN Discharge home Allergies as of 02/28/2020      Reactions   Shellfish Allergy Shortness Of Breath, Rash   Crab causes SOB, Scallops cause rash.  Can eat Shrimp and lobster.   Acetaminophen Other (See Comments)   Effects blood sugars and insulin pump.   Cephalexin Nausea Only, Other (See Comments)   Headaches, dizziness   Pneumococcal Vaccines Swelling   Local injection swelling   Prednisone Nausea Only, Other (See Comments)   Headaches, dizziness, Prefers not to take because of uncontrollable BG's on this med;    Soy Allergy Other (See Comments)   Blood test   Adhesive [tape] Rash   With extended exposure   Peanut-containing Drug Products Rash      Medication List    TAKE these medications   acetaminophen 325 MG tablet Commonly known as: Tylenol Take 2 tablets (650 mg total) by mouth every 6 (six) hours as needed (for pain scale < 4).   albuterol 108 (90 Base) MCG/ACT inhaler Commonly known as: VENTOLIN HFA Inhale into the lungs every 6 (six) hours as needed for wheezing or shortness of breath.  cetirizine 10 MG tablet Commonly known as: ZYRTEC Take 10 mg by mouth daily.   enalapril 2.5 MG tablet Commonly known as: VASOTEC Take 1 tablet (2.5 mg total) by mouth daily.   EPINEPHrine 0.3 mg/0.3 mL Soaj injection Commonly known as:  EPI-PEN Inject into the muscle.   fexofenadine 180 MG tablet Commonly known as: ALLEGRA Take 180 mg by mouth at bedtime.   ibuprofen 600 MG tablet Commonly known as: ADVIL Take 1 tablet (600 mg total) by mouth every 6 (six) hours as needed.   insulin pump Soln Inject into the skin continuous. Humalog 100 units/mL   montelukast 10 MG tablet Commonly known as: SINGULAIR Take 1 tablet (10 mg total) by mouth daily.   NON FORMULARY Continuous glucose monitor   prenatal multivitamin Tabs tablet Take 1 tablet by mouth daily at 12 noon.   psyllium 28 % packet Commonly known as: METAMUCIL SMOOTH TEXTURE Take 1 packet by mouth daily.   senna-docusate 8.6-50 MG tablet Commonly known as: Senokot-S Take 1 tablet by mouth daily.      Follow-up Information    [facility] Follow up.   Specialty: Obstetrics and Gynecology Why: On March 07, 2020 as scheduled. Return to MAU as needed for emergencies. Contact information: [address], [address] [id number] mc [place] [phone]          Sharen Counter Certified Nurse-Midwife 02/28/2020  3:11 PM

## 2020-03-01 ENCOUNTER — Encounter: Payer: Self-pay | Admitting: *Deleted

## 2020-03-02 ENCOUNTER — Telehealth: Payer: Managed Care, Other (non HMO) | Admitting: Obstetrics and Gynecology

## 2020-03-02 ENCOUNTER — Telehealth: Payer: Self-pay | Admitting: Family Medicine

## 2020-03-02 MED ORDER — ENALAPRIL MALEATE 2.5 MG PO TABS: 5.0000 mg | ORAL_TABLET | Freq: Every day | ORAL | 0 refills | Status: DC

## 2020-03-02 NOTE — Telephone Encounter (Signed)
Patient stated her BP was very high, and has requested to speak to clinical staff.

## 2020-03-02 NOTE — Telephone Encounter (Addendum)
Called pt and discussed her concern. She stated that she took her BP @ 11:15 today and it was 169/106. She had a very slight H/A @ the time - "like in the background". She denied having any visual disturbances. Following the elevated BP, she took her daily dose of Enalapril 2.5 mg. A second BP @ 12:30 was 184/104. At present pt states her H/A is 1 on 1-10 pain scale. I advised pt to check her BP again in 20 minutes and I will call her back. She agreed.    1350  Called pt and she reports BP 179/102 in Rt arm, 165/102 in Lt arm. She still has a very mild H/A and says she just feels tired. Per consult w/Dr. Alysia Penna, pt was instructed to take a second pill of Enalapril 2.5 mg now and to increase Enalapril to 5 mg po daily beginning tomorrow. Pt also advised to take Tylenol for the H/A and to rest if possible. Pt should keep appt as scheduled on 3/29 in office. If she develops a severe H/A or visual disturbances as discussed, she should go to MAU for evaluation. Pt voiced understanding.

## 2020-03-07 ENCOUNTER — Ambulatory Visit (INDEPENDENT_AMBULATORY_CARE_PROVIDER_SITE_OTHER): Payer: Managed Care, Other (non HMO) | Admitting: General Practice

## 2020-03-07 ENCOUNTER — Other Ambulatory Visit: Payer: Self-pay

## 2020-03-07 VITALS — BP 176/104 | HR 80 | Ht 62.0 in | Wt 123.0 lb

## 2020-03-07 DIAGNOSIS — O165 Unspecified maternal hypertension, complicating the puerperium: Secondary | ICD-10-CM

## 2020-03-07 DIAGNOSIS — Z0130 Encounter for examination of blood pressure without abnormal findings: Secondary | ICD-10-CM

## 2020-03-07 MED ORDER — ENALAPRIL MALEATE 10 MG PO TABS: 10.0000 mg | ORAL_TABLET | Freq: Every day | ORAL | 0 refills | Status: DC

## 2020-03-07 NOTE — Progress Notes (Signed)
Patient presents to office today for BP check following delivery on 3/17. Patient experienced elevated BPs before delivery. She was sent home on enalapril 2.5mg . 3/24 she called into the office reporting BP were significantly elevated at home and medication was increased to 5mg . She reports almost daily headaches with mild visual disturbances. No edema noted today. Patient reports fasting blood sugars have been in the 70s with postprandial around 105- she is on insulin pump.   Reviewed elevated office BPs of 185/97 and 176/104 with Dr who advises patient to increase enalapril to 10mg  daily and to call on Thursday with BP readings. Discussed with patient. Patient verbalized understanding.   RN BSN 03/07/20

## 2020-03-07 NOTE — Progress Notes (Signed)
Chart reviewed - agree with CMA/RN documentation.  ° °

## 2020-03-10 ENCOUNTER — Telehealth: Payer: Self-pay | Admitting: *Deleted

## 2020-03-10 NOTE — Telephone Encounter (Addendum)
Pt left VM message stating that she was seen in office on 3/29 and Enalapril was adjusted. She is calling to report her BP readings for the past couple days and to receive care advice. Please call back  1605  I called pt and we discussed her recent BP values. She reports highest reading on 3/30 was 155/101. Today @ 1pm BP value was 143/93.  Pt denies H/A or dizziness. Per consult w/Dr. Alysia Penna, pt to continue taking Enalapril 10 mg daily, check BP once daily unless she is having pre-e sx, call office next week to speak with nurse regarding BP values and come to office for face to face visit on 4/19 rather than video visit. Pt agreed and voiced understanding.

## 2020-03-16 ENCOUNTER — Telehealth: Payer: Self-pay | Admitting: Family Medicine

## 2020-03-16 NOTE — Telephone Encounter (Signed)
Patient requesting a call back to report her BP readings.

## 2020-03-17 NOTE — Telephone Encounter (Addendum)
Called pt and discussed her recent BP readings. Yesterday's BP was 147/86 - Rt arm, 148/85 - Lt arm. She states the highest value over the past week was 153/95. Most values are 130's or 140's systolic and 80's-90's diastolic. She had one episode of mild dizziness yesterday however feels it was related to her blood sugar because she had not eaten. She otherwise denies H/A and visual disturbances. Consult w/Dr. Alysia Penna who recommended pt to continue taking Enalapril 10 mg daily. Pt was advised to keep PP appt as scheduled on 4/19. She was also advised to call and schedule appt w/her PCP for Diabetes care and follow up of BP. Pt agreed to plan of care and voiced understanding of all information and instructions given.

## 2020-03-28 ENCOUNTER — Encounter: Payer: Self-pay | Admitting: Family Medicine

## 2020-03-28 ENCOUNTER — Ambulatory Visit (INDEPENDENT_AMBULATORY_CARE_PROVIDER_SITE_OTHER): Payer: Managed Care, Other (non HMO) | Admitting: Family Medicine

## 2020-03-28 ENCOUNTER — Other Ambulatory Visit: Payer: Self-pay

## 2020-03-28 DIAGNOSIS — O165 Unspecified maternal hypertension, complicating the puerperium: Secondary | ICD-10-CM

## 2020-03-28 MED ORDER — ENALAPRIL MALEATE 10 MG PO TABS: 10.0000 mg | ORAL_TABLET | Freq: Every day | ORAL | 0 refills | Status: DC

## 2020-03-28 NOTE — Progress Notes (Signed)
    Post Partum Visit Note  Nicole Carrillo is a 34 y.o. G36P1001 female who presents for a postpartum visit. She is 4 weeks postpartum following a SVD.  I have fully reviewed the prenatal and intrapartum course. The delivery was at 38.1 gestational weeks.  Anesthesia: epidural. Postpartum course has been unremarkable. Baby is doing well. Baby is feeding by breast. Bleeding staining only. Bowel function is normal. Bladder function is normal. Patient is not sexually active. Contraception method is none. Postpartum depression screening: Negative.  The following portions of the patient's history were reviewed and updated as appropriate: allergies, current medications, past family history, past medical history, past social history, past surgical history and problem list.  Review of Systems Pertinent items are noted in HPI.    Objective:  Blood pressure (!) 141/90, pulse 90, weight 115 lb (52.2 kg), unknown if currently breastfeeding.  General:  alert, cooperative and no distress  Lungs: clear to auscultation bilaterally  Heart:  regular rate and rhythm, S1, S2 normal, no murmur, click, rub or gallop  Abdomen: soft, non-tender; bowel sounds normal; no masses,  no organomegaly        Assessment:    Normal postpartum exam.  DM1 Preeclamspia/GHTN Pap smear 08/13/2019.   Plan:   Essential components of care per ACOG recommendations:  1.  Mood and well being: Patient with negative depression screening today. Reviewed local resources for support.  - Patient does not use tobacco.  - hx of drug use? No    2. Infant care and feeding:  -Patient currently breastmilk feeding? Yes -Social determinants of health (SDOH) reviewed in EPIC. No concerns  3. Sexuality, contraception and birth spacing - Patient does not want a pregnancy in the next year.   - Reviewed forms of contraception in tiered fashion. Patient desired condoms today.   - Discussed birth spacing of 18 months  4. Sleep and  fatigue -Encouraged family/partner/community support of 4 hrs of uninterrupted sleep to help with mood and fatigue  5. Physical Recovery  - Discussed patients delivery and complications - Patient had a 1st degree laceration, perineal healing reviewed. Patient expressed understanding - Patient is safe to resume physical and sexual activity  6.  Health Maintenance - Last pap smear done 08/12/19 and was normal with negative HPV.   7. Chronic Disease - DM1 - on insulin pump - Preeclampsia/GHTN - continues to have elevated BP. Continue enalapril and follow up with PCP  Levie Heritage, DO [facility],  [facility]

## 2020-03-31 ENCOUNTER — Ambulatory Visit: Payer: Managed Care, Other (non HMO) | Attending: Internal Medicine

## 2020-03-31 DIAGNOSIS — Z23 Encounter for immunization: Secondary | ICD-10-CM

## 2020-03-31 NOTE — Progress Notes (Signed)
   Covid-19 Vaccination Clinic  Name:  Nicole Carrillo    MRN: [id number] DOB: Dec 16, 1985  03/31/2020  [name] was observed post Covid-19 immunization for 15 minutes without incident. She was provided with Vaccine Information Sheet and instruction to access the V-Safe system.   [name] was instructed to call 911 with any severe reactions post vaccine: Marland Kitchen Difficulty breathing  . Swelling of face and throat  . A fast heartbeat  . A bad rash all over body  . Dizziness and weakness   Immunizations Administered    Name Date Dose VIS Date Route   Pfizer COVID-19 Vaccine 03/31/2020 11:23 AM 0.3 mL 02/03/2019 Intramuscular   Manufacturer: [employer], [employer]   Lot: [id number]   NDC: [address]-6

## 2020-04-04 ENCOUNTER — Ambulatory Visit: Payer: Managed Care, Other (non HMO) | Admitting: Family Medicine

## 2020-04-05 ENCOUNTER — Encounter: Payer: Self-pay | Admitting: Family Medicine

## 2020-04-05 ENCOUNTER — Other Ambulatory Visit: Payer: Self-pay

## 2020-04-05 ENCOUNTER — Ambulatory Visit (INDEPENDENT_AMBULATORY_CARE_PROVIDER_SITE_OTHER): Payer: Managed Care, Other (non HMO) | Admitting: Family Medicine

## 2020-04-05 ENCOUNTER — Telehealth: Payer: Self-pay

## 2020-04-05 VITALS — BP 143/87 | HR 68 | Temp 97.3°F | Resp 16 | Ht 62.0 in | Wt 115.2 lb

## 2020-04-05 DIAGNOSIS — O165 Unspecified maternal hypertension, complicating the puerperium: Secondary | ICD-10-CM | POA: Insufficient documentation

## 2020-04-05 DIAGNOSIS — I159 Secondary hypertension, unspecified: Secondary | ICD-10-CM | POA: Diagnosis not present

## 2020-04-05 DIAGNOSIS — L819 Disorder of pigmentation, unspecified: Secondary | ICD-10-CM

## 2020-04-05 DIAGNOSIS — J4520 Mild intermittent asthma, uncomplicated: Secondary | ICD-10-CM | POA: Diagnosis not present

## 2020-04-05 DIAGNOSIS — T7840XA Allergy, unspecified, initial encounter: Secondary | ICD-10-CM

## 2020-04-05 DIAGNOSIS — E063 Autoimmune thyroiditis: Secondary | ICD-10-CM

## 2020-04-05 DIAGNOSIS — K602 Anal fissure, unspecified: Secondary | ICD-10-CM

## 2020-04-05 DIAGNOSIS — E109 Type 1 diabetes mellitus without complications: Secondary | ICD-10-CM

## 2020-04-05 LAB — COMPREHENSIVE METABOLIC PANEL
ALT: 15 U/L (ref 0–35)
AST: 15 U/L (ref 0–37)
Albumin: 4.4 g/dL (ref 3.5–5.2)
Alkaline Phosphatase: 99 U/L (ref 39–117)
BUN: 13 mg/dL (ref 6–23)
CO2: 29 mEq/L (ref 19–32)
Calcium: 10 mg/dL (ref 8.4–10.5)
Chloride: 100 mEq/L (ref 96–112)
Creatinine, Ser: 0.76 mg/dL (ref 0.40–1.20)
GFR: 105.47 mL/min (ref >60.00)
Glucose, Bld: 286 mg/dL — ABNORMAL HIGH (ref 70–99)
Potassium: 4.2 mEq/L (ref 3.5–5.1)
Sodium: 136 mEq/L (ref 135–145)
Total Bilirubin: 0.7 mg/dL (ref 0.2–1.2)
Total Protein: 7.5 g/dL (ref 6.0–8.3)

## 2020-04-05 LAB — CBC
HCT: 38.4 % (ref 36.0–46.0)
Hemoglobin: 12.8 g/dL (ref 12.0–15.0)
MCHC: 33.4 g/dL (ref 30.0–36.0)
MCV: 90.9 fl (ref 78.0–100.0)
Platelets: 350 10*3/uL (ref 150.0–400.0)
RBC: 4.22 Mil/uL (ref 3.87–5.11)
RDW: 13.8 % (ref 11.5–15.5)
WBC: 4.7 10*3/uL (ref 4.0–10.5)

## 2020-04-05 LAB — MICROALBUMIN / CREATININE URINE RATIO
Creatinine,U: 68 mg/dL
Microalb Creat Ratio: 1.3 mg/g (ref 0.0–30.0)
Microalb, Ur: 0.9 mg/dL (ref 0.0–1.9)

## 2020-04-05 MED ORDER — ENALAPRIL MALEATE 10 MG PO TABS: 15.0000 mg | ORAL_TABLET | Freq: Every day | ORAL | 0 refills | Status: DC

## 2020-04-05 NOTE — Telephone Encounter (Signed)
Initially called the Nurse line to make sure Enalapril was safe for breast feeding. Spoke with Provider Sparacino & she said that it was safe. Called pt back to advise, Pt verbalized understanding.

## 2020-04-05 NOTE — Progress Notes (Signed)
Patient ID: Nicole Carrillo, female  DOB: 1986/09/25, 34 y.o.   MRN: [id number] Patient Care Team    Relationship Specialty Notifications Start End  Ma Hillock, DO PCP - General Family Medicine  02/22/20   Maury Dus, MD Resident Endocrinology  02/22/20     Chief Complaint  Patient presents with  . Hypertension    Pt is checking BP at home and has been 140's/80's. Taking medication everyday, in the AM    Subjective:  Nicole Carrillo is a 34 y.o.  female present for [facility] Hypertension/onset postpartum:  Pt reports compliance with Vasotec 10 mg daily. Blood pressures ranges at home usually in the 600K systolics, 59X-77S diastolics. Patient denies chest pain, shortness of breath or lower extremity edema.  Medication has been tapered up by her obstetrician.  Patient was found to have preeclampsia during her delivery.  Elevated protein creatinine ratio 0.30 on February 23, 2020.   Type 1 diabetes mellitus during pregnancy in third trimester Patient reports she was diagnosed with insulin-dependent diabetes in 2011 at age 11.  She has been under the care of [facility] endocrinology since with routine diabetic management.  She has an insulin pump-Humalog use.  She is interested in transferring to a local endocrinologist in [place].  Hashimoto's thyroiditis Patient reports she was told she has Hashimoto's thyroiditis.  She has not required use of thyroid supplement. TPO antibodies 672.5 09/09/2018.  Asthma/allergies Patient reports she has history of allergies in which she takes Singulair, Zyrtec, Allegra and albuterol as needed.  She reports she is not certain if she has classic asthma, reactive airway disease or reflux.  She at one time was seeing a pulmonologist and was prescribed Breo, and which she felt was not helpful for her symptoms.  She has used the albuterol inhaler at times when feeling mild short of breath.  Reports sometimes it is helpful and sometimes it is not.  She is interested  in establishing with a local allergy and asthma specialist.  Anal fissure Patient reports she has had an anal fissure over the last year.  It has healed and recurred over the last year.  She states that has reoccurred since her delivery.  She is taking stool softeners and Metamucil.  She is hydrating well.  She would like a referral to gastroenterology today  Pigmented skin lesions Patient reports she has noticed new pigmented lesions around her breast.  She also has noted a changing lesion of her left breast.  She is a breast-feeding mother.  She has concerns and would like dermatology referral today.  No personal or family history of skin cancers.   Depression screen [facility] 2/9 02/22/2020 02/17/2020 02/10/2020 01/14/2020 12/30/2019  Decreased Interest 0 0 0 0 0  Down, Depressed, Hopeless 0 0 0 0 0  PHQ - 2 Score 0 0 0 0 0  Altered sleeping - 0 0 0 0  Tired, decreased energy - 0 0 0 0  Change in appetite - 0 0 0 0  Feeling bad or failure about yourself  - 0 0 0 0  Trouble concentrating - 0 0 0 0  Moving slowly or fidgety/restless - 0 0 0 0  Suicidal thoughts - 0 0 0 0  PHQ-9 Score - 0 0 0 0  Difficult doing work/chores - Not difficult at all - - -   GAD 7 : Generalized Anxiety Score 02/17/2020 02/10/2020 01/14/2020 12/30/2019  Nervous, Anxious, on Edge 0 0 0 0  Control/stop worrying 0  0 0 0  Worry too much - different things 0 0 0 0  Trouble relaxing 0 0 0 0  Restless 0 0 0 0  Easily annoyed or irritable 0 0 0 0  Afraid - awful might happen 0 0 0 0  Total GAD 7 Score 0 0 0 0       No flowsheet data found.  Immunization History  Administered Date(s) Administered  . PFIZER SARS-COV-2 Vaccination 03/31/2020  . Tdap 12/17/2019   No exam data present  Past Medical History:  Diagnosis Date  . Allergy   . Asthma   . Chicken pox   . Diabetes mellitus type 1 ([place])   . Fetal CPAM   . GERD (gastroesophageal reflux disease)   . Hashimoto's thyroiditis   . History of migraine   . Insulin  pump in place   . Orthodontics    permanent upper and lower retainers  . Preeclampsia 02/24/2020  . PVD (posterior vitreous detachment), right   . Wears contact lenses    Allergies  Allergen Reactions  . Shellfish Allergy Shortness Of Breath and Rash    Crab causes SOB, Scallops cause rash.  Can eat Shrimp and lobster.  . Acetaminophen Other (See Comments)    Effects blood sugars and insulin pump.  . Cephalexin Nausea Only and Other (See Comments)    Headaches, dizziness  . Pneumococcal Vaccines Swelling    Local injection swelling  . Prednisone Nausea Only and Other (See Comments)    Headaches, dizziness, Prefers not to take because of uncontrollable BG's on this med;     Marland Kitchen Soy Allergy Other (See Comments)    Blood test  . Adhesive [Tape] Rash    With extended exposure  . Peanut-Containing Drug Products Rash   Past Surgical History:  Procedure Laterality Date  . DENTAL SURGERY    . ENDOSCOPIC CONCHA BULLOSA RESECTION Bilateral 07/24/2018   Procedure: ENDOSCOPIC CONCHA BULLOSA MIDDLE TURBINATE;  Surgeon: Margaretha Sheffield, MD;  Location: [place];  Service: ENT;  Laterality: Bilateral;  Diabetic - insulin pump  . SEPTOPLASTY N/A 07/24/2018   Procedure: SEPTOPLASTY;  Surgeon: Margaretha Sheffield, MD;  Location: [place];  Service: ENT;  Laterality: N/A;  NEEDS TO BE FIRST INSULIN PUMP  . TURBINATE REDUCTION Bilateral 07/24/2018   Procedure: INFERIOR TURBINATE REDUCTION;  Surgeon: Margaretha Sheffield, MD;  Location: [place];  Service: ENT;  Laterality: Bilateral;  . WISDOM TOOTH EXTRACTION     Family History  Problem Relation Age of Onset  . Arthritis Mother   . Stroke Mother   . Miscarriages / [place] Mother   . Arthritis Father   . Dementia Maternal Grandmother   . Emphysema Maternal Grandfather    Social History   Social History Narrative   Marital status/children/pets: Married. Soon to have 1 son.    Education/employment: MSN, Nurse.    Safety:       -smoke alarm in the home:Yes     - wears seatbelt: Yes     - Feels safe in their relationships: Yes    Allergies as of 04/05/2020      Reactions   Shellfish Allergy Shortness Of Breath, Rash   Crab causes SOB, Scallops cause rash.  Can eat Shrimp and lobster.   Acetaminophen Other (See Comments)   Effects blood sugars and insulin pump.   Cephalexin Nausea Only, Other (See Comments)   Headaches, dizziness   Pneumococcal Vaccines Swelling   Local injection swelling   Prednisone Nausea  Only, Other (See Comments)   Headaches, dizziness, Prefers not to take because of uncontrollable BG's on this med;    Soy Allergy Other (See Comments)   Blood test   Adhesive [tape] Rash   With extended exposure   Peanut-containing Drug Products Rash      Medication List       Accurate as of April 05, 2020 11:28 AM. If you have any questions, ask your nurse or doctor.        STOP taking these medications   acetaminophen 325 MG tablet Commonly known as: Tylenol Stopped by: Howard Pouch, DO   fexofenadine 180 MG tablet Commonly known as: ALLEGRA Stopped by: Howard Pouch, DO   ibuprofen 600 MG tablet Commonly known as: ADVIL Stopped by: Howard Pouch, DO   senna-docusate 8.6-50 MG tablet Commonly known as: Senokot-S Stopped by: Howard Pouch, DO     TAKE these medications   albuterol 108 (90 Base) MCG/ACT inhaler Commonly known as: VENTOLIN HFA Inhale into the lungs every 6 (six) hours as needed for wheezing or shortness of breath.   cetirizine 10 MG tablet Commonly known as: ZYRTEC Take 10 mg by mouth daily.   COLACE PO Take 1 capsule by mouth daily.   enalapril 10 MG tablet Commonly known as: Vasotec Take 1.5 tablets (15 mg total) by mouth daily. What changed: how much to take Changed by: Howard Pouch, DO   EPINEPHrine 0.3 mg/0.3 mL Soaj injection Commonly known as: EPI-PEN Inject into the muscle.   insulin pump Soln Inject into the skin continuous. Humalog 100  units/mL   montelukast 10 MG tablet Commonly known as: SINGULAIR Take 1 tablet (10 mg total) by mouth daily.   NON FORMULARY Continuous glucose monitor   prenatal multivitamin Tabs tablet Take 1 tablet by mouth daily at 12 noon.   psyllium 28 % packet Commonly known as: METAMUCIL SMOOTH TEXTURE Take 1 packet by mouth daily.       All past medical history, surgical history, allergies, family history, immunizations andmedications were updated in the EMR today and reviewed under the history and medication portions of their EMR.     No results found.  ROS: 14 pt review of systems performed and negative (unless mentioned in an HPI)  Objective: BP (!) 143/87 (BP Location: Right Arm, Patient Position: Sitting, Cuff Size: Normal)   Pulse 68   Temp (!) 97.3 F (36.3 C) (Temporal)   Resp 16   Ht '5\' 2"'  (1.575 m)   Wt 115 lb 4 oz (52.3 kg)   SpO2 97%   Breastfeeding Yes   BMI 21.08 kg/m  Gen: Afebrile. No acute distress.  HENT: AT. [place]. Eyes:Pupils Equal Round Reactive to light, Extraocular movements intact,  Conjunctiva without redness, discharge or icterus. Neck/lymp/endocrine: Supple, no lymphadenopathy, no thyromegaly CV: RRR no murmur, no edema, +2/4 P posterior tibialis pulses Chest: CTAB, no wheeze or crackles Skin: No rashes, purpura or petechiae.  X4 hyperpigmented irregular lesions left breast and x1 right breast. Neuro: Normal gait. PERLA. EOMi. Alert. Oriented x3  Psych: Normal affect, dress and demeanor. Normal speech. Normal thought content and judgment.   Assessment/plan: Nicole Carrillo is a 34 y.o. female present for Est Care Type 1 diabetes mellitus during pregnancy in third trimester/Hashimoto's thyroiditis IDDM on insulin pump> prior [facility] pt. Last a1c 4 weeks ago 7.0. Pt desires referral to Endocrine in [place]. Referral placed.   Asthma/allergies Continue Zyrtec/Allegra Continue Singulair Continue albuterol as needed Referral to allergy and asthma  placed.  Hypertension/onset postpartum:  Above goal.  Goal less than 135/85.   Low-sodium diet.   Increase Vasotec to 15 mg daily.  After 1 week if blood pressures remain above goal, increase to 10 mg twice daily.   -Urine microalbumin collected today -CBC and CMP collected today Follow-up in 3 months, sooner if blood pressures remain above goal.  Anal fissure Recurrent anal fissure, despite conservative measures and continuation of stool softeners. Referral to gastroenterology placed today  Pigmented skin lesions Changing skin lesions of left breast.  Irregular borders and new lesions reported. Refer to dermatology placed for further evaluation.   Return in about 3 months (around 07/05/2020) for [facility] (30 min).  Orders Placed This Encounter  Procedures  . Urine Microalbumin w/creat. ratio  . CBC  . Comp Met (CMET)  . Ambulatory referral to Allergy  . Ambulatory referral to Gastroenterology  . Ambulatory referral to Endocrinology  . Ambulatory referral to Dermatology   Meds ordered this encounter  Medications  . enalapril (VASOTEC) 10 MG tablet    Sig: Take 1.5 tablets (15 mg total) by mouth daily.    Dispense:  135 tablet    Refill:  0    Referral Orders     Ambulatory referral to Allergy     Ambulatory referral to Gastroenterology     Ambulatory referral to Endocrinology     Ambulatory referral to Dermatology   Note is dictated utilizing voice recognition software. Although note has been proof read prior to signing, occasional typographical errors still can be missed. If any questions arise, please do not hesitate to call for verification.  Electronically signed by: Howard Pouch, DO [place]

## 2020-04-05 NOTE — Patient Instructions (Addendum)
I will call you with lab results.   Start 1.5 tabs of vasotec. Goal BP < 135/85 routinely. If remains above this goal increase to 2 tabs (20 mg/d- if desired can split to 10 mg every 12 hours) and inform us so we can bring you in for follow up closer.  If BP less than 110/60 routinely or dizziness with lower BP, follow sooner so we can try to decrease dose.  Follow up 3 months for recheck on blood pressures. Hopefully, this will not be a long term med.    I have placed the referral we discussed today. They will call you to schedule.

## 2020-04-12 ENCOUNTER — Encounter: Payer: Self-pay | Admitting: Family Medicine

## 2020-04-22 ENCOUNTER — Other Ambulatory Visit: Payer: Self-pay

## 2020-04-22 ENCOUNTER — Encounter: Payer: Self-pay | Admitting: Family Medicine

## 2020-04-25 ENCOUNTER — Ambulatory Visit: Payer: Managed Care, Other (non HMO) | Attending: Internal Medicine

## 2020-04-25 DIAGNOSIS — Z23 Encounter for immunization: Secondary | ICD-10-CM

## 2020-04-25 NOTE — Progress Notes (Signed)
   Covid-19 Vaccination Clinic  Name:  Nicole Carrillo    MRN: [id number] DOB: 1986-08-20  04/25/2020  [name] was observed post Covid-19 immunization for 30 minutes based on pre-vaccination screening without incident. She was provided with Vaccine Information Sheet and instruction to access the V-Safe system.   [name] was instructed to call 911 with any severe reactions post vaccine: Marland Kitchen Difficulty breathing  . Swelling of face and throat  . A fast heartbeat  . A bad rash all over body  . Dizziness and weakness   Immunizations Administered    Name Date Dose VIS Date Route   Pfizer COVID-19 Vaccine 04/25/2020 11:02 AM 0.3 mL 02/03/2019 Intramuscular   Manufacturer: [employer], [employer]   Lot: [id number]   NDC: [address]-9

## 2020-04-25 NOTE — Progress Notes (Signed)
   Covid-19 Vaccination Clinic  Name:  Nicole Carrillo    MRN: [id number] DOB: 10-19-1986  04/25/2020  [name] was observed post Covid-19 immunization for 15 minutes without incident. She was provided with Vaccine Information Sheet and instruction to access the V-Safe system.   [name] was instructed to call 911 with any severe reactions post vaccine: Marland Kitchen Difficulty breathing  . Swelling of face and throat  . A fast heartbeat  . A bad rash all over body  . Dizziness and weakness   Immunizations Administered    Name Date Dose VIS Date Route   Pfizer COVID-19 Vaccine 04/25/2020 11:02 AM 0.3 mL 02/03/2019 Intramuscular   Manufacturer: [employer], [employer]   Lot: [id number]   NDC: [address]-2

## 2020-04-26 ENCOUNTER — Encounter: Payer: Self-pay | Admitting: Internal Medicine

## 2020-04-26 ENCOUNTER — Ambulatory Visit: Payer: Managed Care, Other (non HMO) | Admitting: Internal Medicine

## 2020-04-26 ENCOUNTER — Other Ambulatory Visit: Payer: Self-pay

## 2020-04-26 VITALS — BP 142/82 | HR 80 | Temp 98.0°F | Ht 62.0 in | Wt 112.4 lb

## 2020-04-26 DIAGNOSIS — E109 Type 1 diabetes mellitus without complications: Secondary | ICD-10-CM

## 2020-04-26 LAB — GLUCOSE, POCT (MANUAL RESULT ENTRY): POC Glucose: 172 mg/dl — AB (ref 70–99)

## 2020-04-26 NOTE — Progress Notes (Signed)
Name: Nicole Carrillo  MRN/ DOB: [id number], 08/05/1986   Age/ Sex: 34 y.o., female    PCP: Natalia Leatherwood, DO   Reason for Endocrinology Evaluation: Type 1 Diabetes Mellitus     Date of Initial Endocrinology Visit: 04/26/2020     PATIENT IDENTIFIER: [name] is a 34 y.o. female with a past medical history of T1DM and hashimoto's  The patient presented for initial endocrinology clinic visit on 04/26/2020 for consultative assistance with her diabetes management.    HPI: [name] was    Diagnosed with T2DM at age 71 initially but was admitted for hyperglycemia with HHS and was found to have T1DM  Prior Medications tried/Intolerance: does not recall. Has been on pumps since her diagnosis, but new pump started a month  Currently checking blood sugars multiple times through the guardian   Hypoglycemia episodes : yes            Symptoms: yes                Frequency: 2/ month Hemoglobin A1c has ranged from 6.6% in 2021, peaking at 7.7% in 2020. Patient required assistance for hypoglycemia: no Patient has required hospitalization within the last 1 year from hyper or hypoglycemia: no  In terms of diet, the patient eats 3 meals a day, snacks in between . 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 During the visit the pump basal and bolus doses were reviewed including carb/insulin rations and supplemental doses. The clinical list was updated. The glucose meter nor the pump were able to download but I was able to manually look at it and  reviewe it in detail to determine if the current pump settings are providing the best glycemic control without excessive hypoglycemia.  Pump and meter download:    Pump   MEDTRONIC   Settings   Insulin  type   Humalog    Basal rate       0000-0400  0.200 u/h    0400-0700  0.325 u/h    0700-1200  0.550 u/h    1200-1800 0.450   1800-0000 0.450          I:C ratio       0000-0700  1:13   0700-1100 8.5   1100-0000 8.5          Sensitivity       0000  60      AIT     0000  3:30            Type & Model of Pump: Medtronic 770 Insulin Type: Currently using Humalog  Body mass index is 20.56 kg/m.  PUMP STATISTICS: Unable to download        Statin:No ACE-I/ARB: Yes    METER DOWNLOAD SUMMARY: Unable to download    DIABETIC COMPLICATIONS: Microvascular complications:   Psoterior retinal detachment and bleed that is attributed to HTN  Denies: CKD, retinopathy   Last eye exam: Completed 01/2020   Macrovascular complications:    Denies: CAD, PVD, CVA   PAST HISTORY: Past Medical History:  Past Medical History:  Diagnosis Date  . Allergy   . Anxiety   . Asthma   . Chicken pox   . Constipation   .  Diabetes mellitus type 1 (HCC)   . Dysphagia   . Dysthymia   . Elevated platelet count   . Fetal CPAM   . Folliculitis   . GERD (gastroesophageal reflux disease)   . Hashimoto's thyroiditis   . History of migraine   . Hyperlipidemia   . Insulin pump in place   . Left breast mass 01/14/2017   fibroadenoma- benign  . OCD (obsessive compulsive disorder)   . Orthodontics    permanent upper and lower retainers  . Preeclampsia 02/24/2020  . PVD (posterior vitreous detachment), right   . Rectal bleeding   . Rectal fissure   . Tinnitus   . Urticaria   . Wears contact lenses    Past Surgical History:  Past Surgical History:  Procedure Laterality Date  . DENTAL SURGERY    . ENDOSCOPIC CONCHA BULLOSA RESECTION Bilateral 07/24/2018   Procedure: ENDOSCOPIC CONCHA BULLOSA MIDDLE TURBINATE;  Surgeon: Vernie Murders, MD;  Location: [facility];  Service: ENT;  Laterality: Bilateral;  Diabetic - insulin pump  . SEPTOPLASTY N/A 07/24/2018   Procedure:  SEPTOPLASTY;  Surgeon: Vernie Murders, MD;  Location: [facility];  Service: ENT;  Laterality: N/A;  NEEDS TO BE FIRST INSULIN PUMP  . TURBINATE REDUCTION Bilateral 07/24/2018   Procedure: INFERIOR TURBINATE REDUCTION;  Surgeon: Vernie Murders, MD;  Location: [facility];  Service: ENT;  Laterality: Bilateral;  . WISDOM TOOTH EXTRACTION        Social History:  reports that she has never smoked. She has never used smokeless tobacco. She reports previous alcohol use of about 1.0 standard drinks of alcohol per week. She reports previous drug use. Family History:  Family History  Problem Relation Age of Onset  . Arthritis Mother   . Stroke Mother   . Miscarriages / [place] Mother   . Arthritis Father   . Dementia Maternal Grandmother   . Emphysema Maternal Grandfather      HOME MEDICATIONS: Allergies as of 04/26/2020      Reactions   Shellfish Allergy Shortness Of Breath, Rash   Crab causes SOB, Scallops cause rash.  Can eat Shrimp and lobster.   Acetaminophen Other (See Comments)   Effects blood sugars and insulin pump.   Cephalexin Nausea Only, Other (See Comments)   Headaches, dizziness   Pneumococcal Vaccines Swelling   Local injection swelling   Prednisone Nausea Only, Other (See Comments)   Headaches, dizziness, Prefers not to take because of uncontrollable BG's on this med;    Soy Allergy Other (See Comments)   Blood test   Adhesive [tape] Rash   With extended exposure   Peanut-containing Drug Products Rash      Medication List       Accurate as of Apr 26, 2020 11:24 AM. If you have any questions, ask your nurse or doctor.        albuterol 108 (90 Base) MCG/ACT inhaler Commonly known as: VENTOLIN HFA Inhale into the lungs every 6 (six) hours as needed for wheezing or shortness of breath.   cetirizine 10 MG tablet Commonly known as: ZYRTEC Take 10 mg by mouth daily.   COLACE PO Take 1 capsule by mouth daily.   enalapril 10 MG  tablet Commonly known as: Vasotec Take 1.5 tablets (15 mg total) by mouth daily.   EPINEPHrine 0.3 mg/0.3 mL Soaj injection Commonly known as: EPI-PEN Inject into the muscle.   fexofenadine 180 MG tablet Commonly known as: ALLEGRA Take 180 mg by mouth daily.  insulin pump Soln Inject into the skin continuous. Humalog 100 units/mL   montelukast 10 MG tablet Commonly known as: SINGULAIR Take 1 tablet (10 mg total) by mouth daily.   NON FORMULARY Continuous glucose monitor   prenatal multivitamin Tabs tablet Take 1 tablet by mouth daily at 12 noon.   psyllium 28 % packet Commonly known as: METAMUCIL SMOOTH TEXTURE Take 1 packet by mouth daily.        ALLERGIES: Allergies  Allergen Reactions  . Shellfish Allergy Shortness Of Breath and Rash    Crab causes SOB, Scallops cause rash.  Can eat Shrimp and lobster.  . Acetaminophen Other (See Comments)    Effects blood sugars and insulin pump.  . Cephalexin Nausea Only and Other (See Comments)    Headaches, dizziness  . Pneumococcal Vaccines Swelling    Local injection swelling  . Prednisone Nausea Only and Other (See Comments)    Headaches, dizziness, Prefers not to take because of uncontrollable BG's on this med;     Marland Kitchen Soy Allergy Other (See Comments)    Blood test  . Adhesive [Tape] Rash    With extended exposure  . Peanut-Containing Drug Products Rash     REVIEW OF SYSTEMS: A comprehensive ROS was conducted with the patient and is negative except as per HPI and below:  Review of Systems  Gastrointestinal: Negative for diarrhea and nausea.  Genitourinary: Positive for frequency.  Neurological: Negative for tingling.  Endo/Heme/Allergies: Negative for polydipsia.      OBJECTIVE:   VITAL SIGNS: BP (!) 142/82 (BP Location: Right Arm, Patient Position: Sitting, Cuff Size: Normal)   Pulse 80   Temp 98 F (36.7 C)   Ht 5\' 2"  (1.575 m)   Wt 112 lb 6.4 oz (51 kg)   SpO2 98%   BMI 20.56 kg/m    PHYSICAL  EXAM:  General: Pt appears well and is in NAD  HEENT:  Eyes: External eye exam normal without stare, lid lag or exophthalmos.  EOM intact.  Neck: General: Supple without adenopathy or carotid bruits. Thyroid: Thyroid size normal.  No goiter or nodules appreciated. No thyroid bruit.  Lungs: Clear with good BS bilat with no rales, rhonchi, or wheezes  Heart: RRR with normal S1 and S2 and no gallops; no murmurs; no rub  Abdomen: Normoactive bowel sounds, soft, nontender, without masses or organomegaly palpable  Extremities:  Lower extremities - No pretibial edema. No lesions.  Skin: Normal texture and temperature to palpation.   Neuro: MS is good with appropriate affect, pt is alert and Ox3    DM foot exam: 04/26/2020  The skin of the feet is intact without sores or ulcerations. The pedal pulses are 2+ on right and 2+ on left. The sensation is intact to a screening 5.07, 10 gram monofilament bilaterally   DATA REVIEWED:  Lab Results  Component Value Date   HGBA1C 7.0 (H) 02/23/2020   HGBA1C 6.6 (H) 12/17/2019   HGBA1C 7.7 05/07/2019   Lab Results  Component Value Date   MICROALBUR 0.9 04/05/2020   CREATININE 0.76 04/05/2020   Lab Results  Component Value Date   MICRALBCREAT 1.3 04/05/2020      ASSESSMENT / PLAN / RECOMMENDATIONS:   1) Type 1 Diabetes Mellitus, Optimally controlled, Without complications - Most recent A1c of 7.0 %. Goal A1c < 7.0 %.    - We were unable to download her pump today, we tried on multiple computers, my CMA contacted Medtronic rep and it turns out this  is something that will take 2-3 days to fix.  - I have manually reviewed her pump as well as her glucose meter and based on this limited data I have recommended changing basal rate 1200-1800. Adjusting I:C ratio during the day ( post prandial today 172 mg/dL) As well as active insulin time    MEDICATIONS:  Humalog    Pump   MEDTRONIC   Settings   Insulin type   Humalog    Basal rate        0000-0400  0.200 u/h    0400-0700  0.325 u/h    0700-1200  0.550 u/h    1200-1800 0.550   1800-0000 0.450          I:C ratio       0000-0700  1:13   0700-1100 8.0   1100-0000 8.0          Sensitivity       0000  60      AIT     0000  4:00      EDUCATION / INSTRUCTIONS:  BG monitoring instructions: Patient is instructed to check her blood sugars 4 times a day, before meals and bedtime   Call [facility] if: BG persistently < 70 or > 300. . I reviewed the Rule of 15 for the treatment of hypoglycemia in detail with the patient. Literature supplied.   2) Diabetic complications:   Eye: Pt states she has retinal detachment and bleeding due to HTN not DM  Neuro/ Feet: Does not have known diabetic peripheral neuropathy.  Renal: Patient does not have known baseline CKD. She is on an ACEI/ARB at present.    I spent 45 minutes preparing to see the patient by review of recent labs, imaging and procedures, obtaining and reviewing separately obtained history, communicating with the patient/family or caregiver, ordering medications, tests or procedures, and documenting clinical information in the EHR including the differential Dx, treatment, and any further evaluation and other management     Signed electronically by: Mack Guise, MD  [facility]  [facility] [address] [place], [place] [address] Phone: [phone] FAX: [phone]   CC: Ma Hillock, DO [address] [place] [address] Phone: [phone]  Fax: [phone]    Return to Endocrinology clinic as below: Future Appointments  Date Time Provider [place]  04/28/2020  9:00 AM Garnet Sierras, DO [facility] None  07/05/2020 11:00 AM Kuneff, Renee A, DO [facility]

## 2020-04-27 NOTE — Progress Notes (Signed)
New Patient Note  RE: Nicole Carrillo MRN: [id number] DOB: [birth year] Date of Office Visit: 04/28/2020  Referring provider: Natalia Leatherwood, DO Primary care provider: Natalia Leatherwood, DO  Chief Complaint: Asthma and Allergic Rhinitis   History of Present Illness: I had the pleasure of seeing Nicole Carrillo for initial evaluation at the [facility] on 04/28/2020. She is a 34 y.o. female, who is referred here by Felix Pacini A, DO for the evaluation of asthma and allergies.  Breathing: Patient noticed some chest pressure and shortness of breath for the past 5 years. Symptoms initially only occurred a few times per year but have been more frequent lately and now feeling some chest pressure/tightness almost on a daily basis. Usually gets really bad 1-2 times per week. Sometimes symptoms resolve within 30 minutes with no intervention. It can occur indoors or outdoors.  Interestingly during her pregnancy she had no issues. She is currently breastfeeding.   She had cardiac work up about 3 years ago which was negative - non-stress, echo, EKG.  She was seen by pulmonology in 2018 and had full body PFT. She apparently had a positive methacholine challenge test - records not available for review.   Current medications include albuterol prn which help. She reports not using aerochamber with inhalers. She tried the following inhalers: Arnuity, Breo for at least 1 year with no benefit. Main triggers are unknown. In the last month, frequency of symptoms: 1-2x/week. Frequency of nocturnal symptoms: 0x/month. Frequency of SABA use: <1x/week. Interference with physical activity: no. Sleep is undisturbed. In the last 12 months, emergency room visits/urgent care visits/doctor office visits or hospitalizations due to respiratory issues: 0. In the last 12 months, oral steroids courses: 0. Lifetime history of hospitalization for respiratory issues: no. Prior intubations: no. Asthma was diagnosed  in 2018. History of pneumonia: no. She was evaluated by allergist/pulmonologist in the past. Smoking exposure: no. Up to date with flu vaccine: yes. Up to date with COVID-19 vaccine: yes.   Patient was seen by GI and was taking Pepcid for a few months with no improvement. EGD normal per patient report.   Rhinitis: She reports symptoms of itchy eyes, possible PND, pruritic skin. Symptoms have been going on for 3 years. The symptoms are present all year around. Other triggers include exposure to unknown. Anosmia: no. Headache: no. She has used zyrtec in AM, Allegra in PM, Singulair with some improvement in symptoms. Sinus infections: no. Previous work up includes: skin testing in July 2018 by ENT which was positive to grass, ragweed, weed, trees, molds, dust mites, cockroach, horse - patient will drop off testing results.  Patient had some wheezing after allergy skin testing and was treated with epinephrine at that time. Patient underwent a few months of sublingual immunotherapy with ENT with no benefit and had pruritic rash on her face from it so she stopped.   Previous ENT evaluation: sinus surgery - deviated septum repair and turbinate reduction in 2019. History of nasal polyps: no. Last eye exam: 2 months ago.  Patient has whole body pruritus when she comes off the zyrtec and noticed linear lines at times.   Patient has type 1 diabetes and is on insulin. She also has Hashimoto's thyroiditis and is on no medications for this.   Assessment and Plan: Nicole Carrillo is a 34 y.o. female with: Asthma Diagnosed with asthma in 2018 by pulmonology via ? Methacholine challenge test - data not available for review during OV. Tried Arnuity,  Breo with no benefit. No triggers noted. Resolved during pregnancy. Now mainly complaining of chest pressure/tightness which sometimes resolves on its own with no medical therapy but takes 30 minutes. Tried Pepcid for few months with no benefit. Normal EGD in past per patient  report.   Today's spirometry was normal with marginal improvement in FEV1 post bronchodilator treatment. Clinically feeling about the same.  Based on clinical history and significant positive allergy skin testing results in 2018, there is concern for asthma causing some of these chest pressure/tightness symptoms. . Daily controller medication(s): start Symbicort 3mcg 2 puffs twice a day with spacer and rinse mouth afterwards. Sample given.  . Continue with Singulair 10mg  daily at night.  . May use albuterol rescue inhaler 2 puffs every 4 to 6 hours as needed for shortness of breath, chest tightness, coughing, and wheezing. May use albuterol rescue inhaler 2 puffs 5 to 15 minutes prior to strenuous physical activities. Monitor frequency of use.  Marland Kitchen Spacer given and demonstrated proper use with inhaler. Patient understood technique and all questions/concerned were addressed.  . Repeat spirometry at next visit.  . If no improvement, will try PPI next.   Other allergic rhinitis Perennial rhino conjunctivitis symptoms for the past 3 years. 2018 skin testing by ENT was positive to grass, ragweed, weed, trees, molds, dust mites, cockroach, horse. SLIT therapy by ENT x few months with no benefit. Deviated septum repair and turbinate reduction in 2019.  Unable to skin test today due to recent antihistamine intake. Will get bloodwork instead as patient can't come off antihistamines.   Start environmental control measures as below.  Continue with Zyrtec (cetirizine) 10mg  twice a day.  Stop Allegra for now.  Start dymista (fluticasone + azelastine nasal spray combination) 1 spray per nostril twice a day.  This replaces Flonase (fluticasone) for now. If it's not covered let us know.   Dermatographism Patient most likely has dermatographism.  Continue with Zyrtec (cetirizine) 10mg  twice a day as above.  Adverse food reaction 2018 skin testing positive to soy, peanut and borderline to wheat. She had  no prior clinical reactions to these foods. Avoiding certain cocoa products as well due to possibility of causing the chest tightness. Avoiding peanuts, tree nuts and sesame since then but interested in re-introduction.   Unable to skin test today due to recent antihistamine intake.  Continue to avoid soy, sesame, peanuts and tree nuts. Get bloodwork and if favorable will discuss reintroduction at next visit.   I have prescribed epinephrine injectable and demonstrated proper use. For mild symptoms you can take over the counter antihistamines such as Benadryl and monitor symptoms closely. If symptoms worsen or if you have severe symptoms including breathing issues, throat closure, significant swelling, whole body hives, severe diarrhea and vomiting, lightheadedness then inject epinephrine and seek immediate medical care afterwards.  Food action plan given.  Globus sensation Patient also has globus sensation.  Based on her clinical history this may be multifactorial including possible PND (trying nasal spray for this), reflux, and Hashimoto's thyroiditis.   Return in about 2 months (around 06/28/2020).  Meds ordered this encounter  Medications  . Azelastine-Fluticasone 137-50 MCG/ACT SUSP    Sig: Place 1 spray into the nose in the morning and at bedtime.    Dispense:  23 g    Refill:  5  . budesonide-formoterol (SYMBICORT) 80-4.5 MCG/ACT inhaler    Sig: Inhale 2 puffs into the lungs in the morning and at bedtime. with spacer and rinse mouth afterwards.  Dispense:  1 Inhaler    Refill:  5  . EPINEPHrine (AUVI-Q) 0.3 mg/0.3 mL IJ SOAJ injection    Sig: Inject 0.3 mLs (0.3 mg total) into the muscle as needed for anaphylaxis.    Dispense:  1 each    Refill:  2    Lab Orders     IgE Nut Prof. w/Component Rflx     Soybean IgE     Allergen Sesame f10     Chronic Urticaria     Tryptase     ANA w/Reflex     Alpha-Gal Panel     Chocolate IgE     Allergens w/Total IgE Area 2  Other  allergy screening: Food allergy: no  Patient had no known clinical reaction to foods that she is aware of.  She does not have access to epinephrine autoinjector and not needed to use it.   Past work up includes: 2018 skin testing positive to soy, peanut and borderline to wheat. Negative to beef, pork, chicken, milk, shrimp, casein, egg, codfish, tuna, flounder, [place].  Patient tolerates soy, wheat with no issues.  She previously ate peanut with no issues.   Dietary History: patient has been eating other foods including milk, eggs, lobster, seafood, soy, wheat, meats, fruits and vegetables.  She reports reading labels and avoiding peanuts, tree nuts, sesame in diet completely.   Medication allergy: No Hymenoptera allergy: no Urticaria: yes  Yes when around horses.  Eczema:yes since child and using just moisturizers.   History of recurrent infections suggestive of immunodeficency: no  Diagnostics: Spirometry:  Tracings reviewed. Her effort: Good reproducible efforts. FVC: 2.93L FEV1: 2.66L, 107% predicted FEV1/FVC ratio: 91% Interpretation: Spirometry consistent with normal pattern with marginal improvement in FEV1 post bronchodilator treatment. Clinically feeling about the same.  Please see scanned spirometry results for details.  Skin Testing: Deferred due to recent antihistamines use.  Past Medical History: Patient Active Problem List   Diagnosis Date Noted  . Dermatographism 04/28/2020  . Other allergic rhinitis 04/28/2020  . Adverse food reaction 04/28/2020  . Globus sensation 04/28/2020  . Anal fissure 04/05/2020  . Postpartum hypertension 04/05/2020  . Type 1 diabetes mellitus without complications (HCC) 04/05/2020  . Pigmented skin lesions 04/05/2020  . Allergies 02/22/2020  . Hashimoto's thyroiditis 11/23/2019  . Asthma 08/13/2019  . Gastroesophageal reflux disease without esophagitis 08/13/2019   Past Medical History:  Diagnosis Date  . Allergy   .  Angio-edema   . Anxiety   . Asthma   . Chicken pox   . Constipation   . Diabetes mellitus type 1 (HCC)   . Dysphagia   . Dysthymia   . Eczema   . Elevated platelet count   . Fetal CPAM   . Folliculitis   . GERD (gastroesophageal reflux disease)   . Hashimoto's thyroiditis   . History of migraine   . Hyperlipidemia   . Insulin pump in place   . Left breast mass 01/14/2017   fibroadenoma- benign  . OCD (obsessive compulsive disorder)   . Orthodontics    permanent upper and lower retainers  . Preeclampsia 02/24/2020  . PVD (posterior vitreous detachment), right   . Rectal bleeding   . Rectal fissure   . Tinnitus   . Urticaria   . Wears contact lenses    Past Surgical History: Past Surgical History:  Procedure Laterality Date  . DENTAL SURGERY    . ENDOSCOPIC CONCHA BULLOSA RESECTION Bilateral 07/24/2018   Procedure: ENDOSCOPIC CONCHA BULLOSA MIDDLE TURBINATE;  Surgeon: Vernie Murders, MD;  Location: [facility];  Service: ENT;  Laterality: Bilateral;  Diabetic - insulin pump  . SEPTOPLASTY N/A 07/24/2018   Procedure: SEPTOPLASTY;  Surgeon: Vernie Murders, MD;  Location: [facility];  Service: ENT;  Laterality: N/A;  NEEDS TO BE FIRST INSULIN PUMP  . TURBINATE REDUCTION Bilateral 07/24/2018   Procedure: INFERIOR TURBINATE REDUCTION;  Surgeon: Vernie Murders, MD;  Location: [facility];  Service: ENT;  Laterality: Bilateral;  . WISDOM TOOTH EXTRACTION     Medication List:  Current Outpatient Medications  Medication Sig Dispense Refill  . albuterol (PROVENTIL HFA;VENTOLIN HFA) 108 (90 Base) MCG/ACT inhaler Inhale into the lungs every 6 (six) hours as needed for wheezing or shortness of breath.    . cetirizine (ZYRTEC) 10 MG tablet Take 10 mg by mouth daily.    Tery Sanfilippo Sodium (COLACE PO) Take 1 capsule by mouth daily.    . enalapril (VASOTEC) 10 MG tablet Take 1.5 tablets (15 mg total) by mouth daily. 135 tablet 0  . fexofenadine (ALLEGRA) 180 MG tablet  Take 180 mg by mouth daily.    . Insulin Human (INSULIN PUMP) SOLN Inject into the skin continuous. Humalog 100 units/mL    . montelukast (SINGULAIR) 10 MG tablet Take 1 tablet (10 mg total) by mouth daily. 90 tablet 3  . NON FORMULARY Continuous glucose monitor    . Prenatal Vit-Fe Fumarate-FA (PRENATAL MULTIVITAMIN) TABS tablet Take 1 tablet by mouth daily at 12 noon.    . psyllium (METAMUCIL SMOOTH TEXTURE) 28 % packet Take 1 packet by mouth daily.    . Azelastine-Fluticasone 137-50 MCG/ACT SUSP Place 1 spray into the nose in the morning and at bedtime. 23 g 5  . budesonide-formoterol (SYMBICORT) 80-4.5 MCG/ACT inhaler Inhale 2 puffs into the lungs in the morning and at bedtime. with spacer and rinse mouth afterwards. 1 Inhaler 5  . EPINEPHrine (AUVI-Q) 0.3 mg/0.3 mL IJ SOAJ injection Inject 0.3 mLs (0.3 mg total) into the muscle as needed for anaphylaxis. 1 each 2   No current facility-administered medications for this visit.   Allergies: Allergies  Allergen Reactions  . Shellfish Allergy Shortness Of Breath and Rash    Crab causes SOB, Scallops cause rash.  Can eat Shrimp and lobster.  . Acetaminophen Other (See Comments)    Effects blood sugars and insulin pump.  . Cephalexin Nausea Only and Other (See Comments)    Headaches, dizziness  . Pneumococcal Vaccines Swelling    Local injection swelling  . Prednisone Nausea Only and Other (See Comments)    Headaches, dizziness, Prefers not to take because of uncontrollable BG's on this med;     Marland Kitchen Soy Allergy Other (See Comments)    Blood test  . Adhesive [Tape] Rash    With extended exposure  . Peanut-Containing Drug Products Rash   Social History: Social History   Socioeconomic History  . Marital status: Married    Spouse name: Not on file  . Number of children: Not on file  . Years of education: Not on file  . Highest education level: Not on file  Occupational History  . Not on file  Tobacco Use  . Smoking status: Never  Smoker  . Smokeless tobacco: Never Used  Substance and Sexual Activity  . Alcohol use: Not Currently    Alcohol/week: 1.0 standard drinks    Types: 1 Glasses of wine per week  . Drug use: Not Currently  . Sexual activity: Yes  Partners: Male    Birth control/protection: None  Other Topics Concern  . Not on file  Social History Narrative   Marital status/children/pets: Married. Soon to have 1 son.    Education/employment: MSN, Nurse.    Safety:      -smoke alarm in the home:Yes     - wears seatbelt: Yes     - Feels safe in their relationships: Yes   Social Determinants of Health   Financial Resource Strain:   . Difficulty of Paying Living Expenses:   Food Insecurity: No Food Insecurity  . Worried About Programme researcher, broadcasting/film/videounning Out of Food in the Last Year: Never true  . Ran Out of Food in the Last Year: Never true  Transportation Needs: No Transportation Needs  . Lack of Transportation (Medical): No  . Lack of Transportation (Non-Medical): No  Physical Activity:   . Days of Exercise per Week:   . Minutes of Exercise per Session:   Stress:   . Feeling of Stress :   Social Connections:   . Frequency of Communication with Friends and Family:   . Frequency of Social Gatherings with Friends and Family:   . Attends Religious Services:   . Active Member of Clubs or Organizations:   . Attends BankerClub or Organization Meetings:   Marland Kitchen. Marital Status:    Lives in a house built in 2021. Smoking: denies Occupation: Armed forces training and education officerN  Environmental HistorySurveyor, minerals: Water Damage/mildew in the house: no Engineer, civil (consulting)Carpet in the family room: yes Carpet in the bedroom: yes Heating: gas Cooling: central Pet: no  Family History: Family History  Problem Relation Age of Onset  . Arthritis Mother   . Stroke Mother   . Miscarriages / [place]birth[place] Mother   . Arthritis Father   . Dementia Maternal Grandmother   . Emphysema Maternal Grandfather    Review of Systems  Constitutional: Negative for appetite change, chills, fever and  unexpected weight change.  HENT: Negative for congestion and rhinorrhea.   Eyes: Positive for itching.  Respiratory: Positive for chest tightness. Negative for cough, shortness of breath and wheezing.   Cardiovascular: Negative for chest pain.  Gastrointestinal: Negative for abdominal pain.  Genitourinary: Negative for difficulty urinating.  Skin: Negative for rash.  Allergic/Immunologic: Positive for environmental allergies.  Neurological: Negative for headaches.   Objective: BP 122/70 (BP Location: Left Arm, Patient Position: Sitting, Cuff Size: Small)   Pulse 80   Temp 97.9 F (36.6 C) (Temporal)   Resp 18   Ht 5\' 2"  (1.575 m)   Wt 112 lb 12 oz (51.1 kg)   SpO2 98%   BMI 20.62 kg/m  Body mass index is 20.62 kg/m. Physical Exam  Constitutional: She is oriented to person, place, and time. She appears well-developed and well-nourished.  HENT:  Head: Normocephalic and atraumatic.  Right Ear: External ear normal.  Left Ear: External ear normal.  Nose: Nose normal.  Mouth/Throat: Oropharynx is clear and moist.  Eyes: Conjunctivae and EOM are normal.  Cardiovascular: Normal rate, regular rhythm and normal heart sounds. Exam reveals no gallop and no friction rub.  No murmur heard. Pulmonary/Chest: Effort normal and breath sounds normal. She has no wheezes. She has no rales.  Abdominal: Soft.  Musculoskeletal:     Cervical back: Neck supple.  Neurological: She is alert and oriented to person, place, and time.  Skin: Skin is warm. No rash noted.  Psychiatric: She has a normal mood and affect. Her behavior is normal.  Nursing note and vitals reviewed.  The plan was reviewed  with the patient/family, and all questions/concerned were addressed.  It was my pleasure to see Mabeline today and participate in her care. Please feel free to contact me with any questions or concerns.  Sincerely,  Wyline Mood, DO Allergy & Immunology  [facility] office: [phone] [facility] office: [phone] [place] office: [phone]

## 2020-04-28 ENCOUNTER — Other Ambulatory Visit: Payer: Self-pay

## 2020-04-28 ENCOUNTER — Ambulatory Visit (INDEPENDENT_AMBULATORY_CARE_PROVIDER_SITE_OTHER): Payer: Managed Care, Other (non HMO) | Admitting: Allergy

## 2020-04-28 ENCOUNTER — Encounter: Payer: Self-pay | Admitting: Allergy

## 2020-04-28 VITALS — BP 122/70 | HR 80 | Temp 97.9°F | Resp 18 | Ht 62.0 in | Wt 112.8 lb

## 2020-04-28 DIAGNOSIS — T781XXA Other adverse food reactions, not elsewhere classified, initial encounter: Secondary | ICD-10-CM | POA: Insufficient documentation

## 2020-04-28 DIAGNOSIS — L509 Urticaria, unspecified: Secondary | ICD-10-CM | POA: Diagnosis not present

## 2020-04-28 DIAGNOSIS — J3089 Other allergic rhinitis: Secondary | ICD-10-CM | POA: Insufficient documentation

## 2020-04-28 DIAGNOSIS — J45909 Unspecified asthma, uncomplicated: Secondary | ICD-10-CM

## 2020-04-28 DIAGNOSIS — R0989 Other specified symptoms and signs involving the circulatory and respiratory systems: Secondary | ICD-10-CM

## 2020-04-28 DIAGNOSIS — L503 Dermatographic urticaria: Secondary | ICD-10-CM | POA: Insufficient documentation

## 2020-04-28 DIAGNOSIS — T781XXD Other adverse food reactions, not elsewhere classified, subsequent encounter: Secondary | ICD-10-CM

## 2020-04-28 HISTORY — DX: Other adverse food reactions, not elsewhere classified, initial encounter: T78.1XXA

## 2020-04-28 MED ORDER — EPINEPHRINE 0.3 MG/0.3ML IJ SOAJ: 0.3000 mg | INTRAMUSCULAR | 2 refills | Status: DC | PRN

## 2020-04-28 MED ORDER — BUDESONIDE-FORMOTEROL FUMARATE 80-4.5 MCG/ACT IN AERO
2.0000 | INHALATION_SPRAY | Freq: Two times a day (BID) | RESPIRATORY_TRACT | 5 refills | Status: DC
Start: 2020-04-28 — End: 2020-06-06

## 2020-04-28 MED ORDER — AZELASTINE-FLUTICASONE 137-50 MCG/ACT NA SUSP
1.0000 | Freq: Two times a day (BID) | NASAL | 5 refills | Status: DC
Start: 2020-04-28 — End: 2020-09-12

## 2020-04-28 NOTE — Assessment & Plan Note (Addendum)
Diagnosed with asthma in 2018 by pulmonology via ? Methacholine challenge test - data not available for review during OV. Tried Arnuity, Breo with no benefit. No triggers noted. Resolved during pregnancy. Now mainly complaining of chest pressure/tightness which sometimes resolves on its own with no medical therapy but takes 30 minutes. Tried Pepcid for few months with no benefit. Normal EGD in past per patient report.   Today's spirometry was normal with marginal improvement in FEV1 post bronchodilator treatment. Clinically feeling about the same.  Based on clinical history and significant positive allergy skin testing results in 2018, there is concern for asthma causing some of these chest pressure/tightness symptoms. . Daily controller medication(s): start Symbicort 2 puffs twice a day with spacer and rinse mouth afterwards. Sample given.  . Continue with Singulair 10mg  daily at night.  . May use albuterol rescue inhaler 2 puffs every 4 to 6 hours as needed for shortness of breath, chest tightness, coughing, and wheezing. May use albuterol rescue inhaler 2 puffs 5 to 15 minutes prior to strenuous physical activities. Monitor frequency of use.  Spacer given and demonstrated proper use with inhaler. Patient understood technique and all questions/concerned were addressed.  . Repeat spirometry at next visit.  . If no improvement, will try PPI next.

## 2020-04-28 NOTE — Patient Instructions (Addendum)
Please drop off a copy of your allergy test results so I can scan into your chart.  Today's breathing test showed: Marland Kitchen Daily controller medication(s): start Symbicort 37mcg 2 puffs twice a day with spacer and rinse mouth afterwards. . Continue with Singulair 10mg  daily at night.  . May use albuterol rescue inhaler 2 puffs every 4 to 6 hours as needed for shortness of breath, chest tightness, coughing, and wheezing. May use albuterol rescue inhaler 2 puffs 5 to 15 minutes prior to strenuous physical activities. Monitor frequency of use.  . Asthma control goals:  o Full participation in all desired activities (may need albuterol before activity) o Albuterol use two times or less a week on average (not counting use with activity) o Cough interfering with sleep two times or less a month o Oral steroids no more than once a year o No hospitalizations  Environmental allergies:  Start environmental control measures - grass, ragweed, weed, trees, molds, dust mites, cockroach, horse.  Continue with Zyrtec (cetirizine) 10mg  twice a day.  Stop Allegra for now.  Start dymista (fluticasone + azelastine nasal spray combination) 1 spray per nostril twice a day.  This replaces Flonase (fluticasone) for now. If it's not covered let us know.   Food allergies:  Continue to avoid soy, sesame, peanuts and tree nuts. Get bloodwork:  We are ordering labs, so please allow 1-2 weeks for the results to come back. With the newly implemented Cures Act, the labs might be visible to you at the same time that they become visible to me. However, I will not address the results until all of the results are back, so please be patient.  In the meantime, continue recommendations in your patient instructions, including avoidance measures (if applicable), until you hear from me.  I have prescribed epinephrine injectable and demonstrated proper use. For mild symptoms you can take over the counter antihistamines such as  Benadryl and monitor symptoms closely. If symptoms worsen or if you have severe symptoms including breathing issues, throat closure, significant swelling, whole body hives, severe diarrhea and vomiting, lightheadedness then inject epinephrine and seek immediate medical care afterwards.  Food action plan  Skin care:  See below for proper skin care.  Follow up in 2 months or sooner if needed.   Reducing Pollen Exposure . Pollen seasons: trees (spring), grass (summer) and ragweed/weeds (fall). Marland Kitchen Keep windows closed in your home and car to lower pollen exposure.  Susa Simmonds air conditioning in the bedroom and throughout the house if possible.  . Avoid going out in dry windy days - especially early morning. . Pollen counts are highest between 5 - 10 AM and on dry, hot and windy days.  . Save outside activities for late afternoon or after a heavy rain, when pollen levels are lower.  . Avoid mowing of grass if you have grass pollen allergy. Marland Kitchen Be aware that pollen can also be transported indoors on people and pets.  . Dry your clothes in an automatic dryer rather than hanging them outside where they might collect pollen.  . Rinse hair and eyes before bedtime. Control of House Dust Mite Allergen . Dust mite allergens are a common trigger of allergy and asthma symptoms. While they can be found throughout the house, these microscopic creatures thrive in warm, humid environments such as bedding, upholstered furniture and carpeting. . Because so much time is spent in the bedroom, it is essential to reduce mite levels there.  . Encase pillows, mattresses, and box  springs in special allergen-proof fabric covers or airtight, zippered plastic covers.  . Bedding should be washed weekly in hot water (130 F) and dried in a hot dryer. Allergen-proof covers are available for comforters and pillows that can't be regularly washed.  Reyes Ivan the allergy-proof covers every few months. Minimize clutter in the bedroom.  Keep pets out of the bedroom.  Marland Kitchen Keep humidity less than 50% by using a dehumidifier or air conditioning. You can buy a humidity measuring device called a hygrometer to monitor this.  . If possible, replace carpets with hardwood, linoleum, or washable area rugs. If that's not possible, vacuum frequently with a vacuum that has a HEPA filter. . Remove all upholstered furniture and non-washable window drapes from the bedroom. . Remove all non-washable stuffed toys from the bedroom.  Wash stuffed toys weekly. Mold Control . Mold and fungi can grow on a variety of surfaces provided certain temperature and moisture conditions exist.  . Outdoor molds grow on plants, decaying vegetation and soil. The major outdoor mold, Alternaria and Cladosporium, are found in very high numbers during hot and dry conditions. Generally, a late summer - fall peak is seen for common outdoor fungal spores. Rain will temporarily lower outdoor mold spore count, but counts rise rapidly when the rainy period ends. . The most important indoor molds are Aspergillus and Penicillium. Dark, humid and poorly ventilated basements are ideal sites for mold growth. The next most common sites of mold growth are the bathroom and the kitchen. Outdoor (Seasonal) Mold Control . Use air conditioning and keep windows closed. . Avoid exposure to decaying vegetation. Marland Kitchen Avoid leaf raking. . Avoid grain handling. . Consider wearing a face mask if working in moldy areas.  Indoor (Perennial) Mold Control  . Maintain humidity below 50%. . Get rid of mold growth on hard surfaces with water, detergent and, if necessary, 5% bleach (do not mix with other cleaners). Then dry the area completely. If mold covers an area more than 10 square feet, consider hiring an indoor environmental professional. . For clothing, washing with soap and water is best. If moldy items cannot be cleaned and dried, throw them away. . Remove sources e.g. contaminated  carpets. . Repair and seal leaking roofs or pipes. Using dehumidifiers in damp basements may be helpful, but empty the water and clean units regularly to prevent mildew from forming. All rooms, especially basements, bathrooms and kitchens, require ventilation and cleaning to deter mold and mildew growth. Avoid carpeting on concrete or damp floors, and storing items in damp areas. Cockroach Allergen Avoidance Cockroaches are often found in the homes of densely populated urban areas, schools or commercial buildings, but these creatures can lurk almost anywhere. This does not mean that you have a dirty house or living area. . Block all areas where roaches can enter the home. This includes crevices, wall cracks and windows.  . Cockroaches need water to survive, so fix and seal all leaky faucets and pipes. Have an exterminator go through the house when your family and pets are gone to eliminate any remaining roaches. Marland Kitchen Keep food in lidded containers and put pet food dishes away after your pets are done eating. Vacuum and sweep the floor after meals, and take out garbage and recyclables. Use lidded garbage containers in the kitchen. Wash dishes immediately after use and clean under stoves, refrigerators or toasters where crumbs can accumulate. Wipe off the stove and other kitchen surfaces and cupboards regularly.   Skin care recommendations  Bath time: . Always use lukewarm water. AVOID very hot or cold water. Marland Kitchen Keep bathing time to 5-10 minutes. . Do NOT use bubble bath. . Use a mild soap and use just enough to wash the dirty areas. . Do NOT scrub skin vigorously.  . After bathing, pat dry your skin with a towel. Do NOT rub or scrub the skin.  Moisturizers and prescriptions:  . ALWAYS apply moisturizers immediately after bathing (within 3 minutes). This helps to lock-in moisture. . Use the moisturizer several times a day over the whole body. Peri Jefferson summer moisturizers include: Aveeno, CeraVe,  Cetaphil. Peri Jefferson winter moisturizers include: Aquaphor, Vaseline, Cerave, Cetaphil, Eucerin, Vanicream. . When using moisturizers along with medications, the moisturizer should be applied about one hour after applying the medication to prevent diluting effect of the medication or moisturize around where you applied the medications. When not using medications, the moisturizer can be continued twice daily as maintenance.  Laundry and clothing: . Avoid laundry products with added color or perfumes. . Use unscented hypo-allergenic laundry products such as Tide free, Cheer free & gentle, and All free and clear.  . If the skin still seems dry or sensitive, you can try double-rinsing the clothes. . Avoid tight or scratchy clothing such as wool. . Do not use fabric softeners or dyer sheets.

## 2020-04-28 NOTE — Assessment & Plan Note (Addendum)
Perennial rhino conjunctivitis symptoms for the past 3 years. 2018 skin testing by ENT was positive to grass, ragweed, weed, trees, molds, dust mites, cockroach, horse. SLIT therapy by ENT x few months with no benefit. Deviated septum repair and turbinate reduction in 2019.  Unable to skin test today due to recent antihistamine intake. Will get bloodwork instead as patient can't come off antihistamines.   Start environmental control measures as below.  Continue with Zyrtec (cetirizine) 10mg  twice a day.  Stop Allegra for now.  Start dymista (fluticasone + azelastine nasal spray combination) 1 spray per nostril twice a day.  This replaces Flonase (fluticasone) for now. If it's not covered let know.

## 2020-04-28 NOTE — Assessment & Plan Note (Signed)
Patient also has globus sensation.  Based on her clinical history this may be multifactorial including possible PND (trying nasal spray for this), reflux, and Hashimoto's thyroiditis.

## 2020-04-28 NOTE — Assessment & Plan Note (Addendum)
2018 skin testing positive to soy, peanut and borderline to wheat. She had no prior clinical reactions to these foods. Avoiding certain cocoa products as well due to possibility of causing the chest tightness. Avoiding peanuts, tree nuts and sesame since then but interested in re-introduction.   Unable to skin test today due to recent antihistamine intake.  Continue to avoid soy, sesame, peanuts and tree nuts. Get bloodwork and if favorable will discuss reintroduction at next visit.   I have prescribed epinephrine injectable and demonstrated proper use. For mild symptoms you can take over the counter antihistamines such as Benadryl and monitor symptoms closely. If symptoms worsen or if you have severe symptoms including breathing issues, throat closure, significant swelling, whole body hives, severe diarrhea and vomiting, lightheadedness then inject epinephrine and seek immediate medical care afterwards.  Food action plan given.

## 2020-04-28 NOTE — Assessment & Plan Note (Signed)
Patient most likely has dermatographism.  Continue with Zyrtec (cetirizine) 10mg  twice a day as above.

## 2020-05-03 ENCOUNTER — Telehealth: Payer: Self-pay | Admitting: Allergy

## 2020-05-03 MED ORDER — MONTELUKAST SODIUM 10 MG PO TABS: 10.0000 mg | ORAL_TABLET | Freq: Every day | ORAL | 1 refills | Status: DC

## 2020-05-03 NOTE — Telephone Encounter (Signed)
Patient called and would like Dr. Selena Batten to take over montelukast prescription. Patient needs refill sent to [facility] on [address] in [place].  Please advise.

## 2020-05-03 NOTE — Telephone Encounter (Signed)
I sent in montelukast prescription.

## 2020-05-03 NOTE — Telephone Encounter (Signed)
Dr. Selena Batten please verify if this is something you are ok with doing. Thank You.

## 2020-05-04 ENCOUNTER — Encounter: Payer: Self-pay | Admitting: Family Medicine

## 2020-05-09 ENCOUNTER — Encounter: Payer: Self-pay | Admitting: Family Medicine

## 2020-05-10 NOTE — Telephone Encounter (Signed)
Please inform pt -all the vitals she mentioned are within normal range. None of them would cause her to feel the symptoms she reported.  If she desires to try to come off BP med, then she can reduce her dose back to enalapril 10 mg a day (1 tab). Monitor BP, and if > 130/80 she would then need to increase back to enalapril to 15 mg.  I would not advise her to stop the medication at this time. We can discuss more or changing medication if she desires at her appt in July- sooner if needed

## 2020-05-13 LAB — ALPHA-GAL PANEL
Alpha Gal IgE*: <0.1 kU/L (ref <0.10)
Beef (Bos spp) IgE: <0.1 kU/L (ref <0.35)
Class Interpretation: 0
Class Interpretation: 0
Class Interpretation: 0
Lamb/Mutton (Ovis spp) IgE: <0.1 kU/L (ref <0.35)
Pork (Sus spp) IgE: <0.1 kU/L (ref <0.35)

## 2020-05-13 LAB — ALLERGENS W/TOTAL IGE AREA 2
Alternaria Alternata IgE: 1.4 kU/L — AB
Aspergillus Fumigatus IgE: 0.79 kU/L — AB
Bermuda Grass IgE: 0.33 kU/L — AB
Cat Dander IgE: <0.1 kU/L
Cedar, Mountain IgE: 5.01 kU/L — AB
Cladosporium Herbarum IgE: 0.69 kU/L — AB
Cockroach, German IgE: <0.1 kU/L
Common Silver Birch IgE: 0.49 kU/L — AB
Cottonwood IgE: 0.8 kU/L — AB
D Farinae IgE: 45.6 kU/L — AB
D Pteronyssinus IgE: 31.4 kU/L — AB
Dog Dander IgE: <0.1 kU/L
Elm, American IgE: 0.88 kU/L — AB
IgE (Immunoglobulin E), Serum: 259 IU/mL (ref 6–495)
Johnson Grass IgE: 0.34 kU/L — AB
Maple/Box Elder IgE: 0.54 kU/L — AB
Mouse Urine IgE: <0.1 kU/L
Oak, White IgE: 0.61 kU/L — AB
Pecan, Hickory IgE: 0.85 kU/L — AB
Penicillium Chrysogen IgE: 0.27 kU/L — AB
Pigweed, Rough IgE: 0.32 kU/L — AB
Ragweed, Short IgE: 0.45 kU/L — AB
Sheep Sorrel IgE Qn: 0.49 kU/L — AB
Timothy Grass IgE: 1.26 kU/L — AB
White Mulberry IgE: <0.1 kU/L

## 2020-05-13 LAB — IGE NUT PROF. W/COMPONENT RFLX
F017-IgE Hazelnut (Filbert): 0.14 kU/L — AB
F018-IgE Brazil Nut: <0.1 kU/L
F020-IgE Almond: <0.1 kU/L
F202-IgE Cashew Nut: <0.1 kU/L
F203-IgE Pistachio Nut: 0.48 kU/L — AB
F256-IgE Walnut: <0.1 kU/L
Macadamia Nut, IgE: 0.19 kU/L — AB
Peanut, IgE: 0.25 kU/L — AB
Pecan Nut IgE: <0.1 kU/L

## 2020-05-13 LAB — PANEL 604726
Cor A 1 IgE: <0.1 kU/L
Cor A 14 IgE: <0.1 kU/L
Cor A 8 IgE: <0.1 kU/L
Cor A 9 IgE: <0.1 kU/L

## 2020-05-13 LAB — ALLERGEN SESAME F10: Sesame Seed IgE: 3.04 kU/L — AB

## 2020-05-13 LAB — TRYPTASE: Tryptase: 16.4 ug/L — ABNORMAL HIGH (ref 2.2–13.2)

## 2020-05-13 LAB — PEANUT COMPONENTS
F352-IgE Ara h 8: <0.1 kU/L
F422-IgE Ara h 1: <0.1 kU/L
F423-IgE Ara h 2: <0.1 kU/L
F424-IgE Ara h 3: <0.1 kU/L
F427-IgE Ara h 9: <0.1 kU/L
F447-IgE Ara h 6: <0.1 kU/L

## 2020-05-13 LAB — ANA W/REFLEX: Anti Nuclear Antibody (ANA): NEGATIVE

## 2020-05-13 LAB — ALLERGEN SOYBEAN: Soybean IgE: 0.28 kU/L — AB

## 2020-05-13 LAB — ALLERGEN CHOCOLATE: Chocolate/Cacao IgE: <0.1 kU/L

## 2020-05-13 LAB — CHRONIC URTICARIA: cu index: <1 (ref <10)

## 2020-05-13 LAB — ALLERGEN COMPONENT COMMENTS

## 2020-05-17 ENCOUNTER — Telehealth: Payer: Self-pay

## 2020-05-17 NOTE — Telephone Encounter (Signed)
Nicole Carrillo called to follow up with Dr Selena Batten regarding her elevated Tryptase labs. She has some concerns regarding her results & what she should be doing.  Nicole Carrillo is wondering also if she should move her follow up visit up sooner than July 22?  Please Advise.

## 2020-05-17 NOTE — Telephone Encounter (Signed)
Spoke to patient and let her know she doesn't need to come in any earlier than 06/30/2020, we would like to give it a little time to check her levels again. She is however concerned about a high Tryptase level and she is concerned about having a blood disease.

## 2020-05-18 ENCOUNTER — Encounter: Payer: Self-pay | Admitting: Allergy

## 2020-05-18 ENCOUNTER — Telehealth: Payer: Self-pay

## 2020-05-18 NOTE — Telephone Encounter (Signed)
error 

## 2020-05-18 NOTE — Telephone Encounter (Signed)
Nicole Carrillo is there anyway we can look into this?  Thanks

## 2020-05-25 ENCOUNTER — Encounter: Payer: Self-pay | Admitting: Family Medicine

## 2020-05-25 DIAGNOSIS — Z111 Encounter for screening for respiratory tuberculosis: Secondary | ICD-10-CM

## 2020-05-25 NOTE — Telephone Encounter (Signed)
Pts last appt was 04/05/2020 and has next appt scheduled for 07/05/2020 Please advise.

## 2020-05-25 NOTE — Telephone Encounter (Signed)
Pt can be scheduled for lab appt only for test. Order placed.

## 2020-05-25 NOTE — Addendum Note (Signed)
Addended by: Felix Pacini A on: 05/25/2020 05:00 PM   Modules accepted: Orders

## 2020-05-30 ENCOUNTER — Telehealth: Payer: Self-pay

## 2020-05-30 MED ORDER — BREO ELLIPTA 100-25 MCG/INH IN AEPB: 1.0000 | INHALATION_SPRAY | Freq: Every day | RESPIRATORY_TRACT | 5 refills | Status: DC

## 2020-05-30 NOTE — Telephone Encounter (Signed)
Patient returned call to the office.  Patient was notified that Symbicort is not covered by her plan.  Patient was informed of alternatives covered.  She has used Breo in the past and would like to try [facility] again.

## 2020-05-30 NOTE — Addendum Note (Signed)
Addended by: Ellamae Sia on: 05/30/2020 11:57 AM   Modules accepted: Orders

## 2020-05-30 NOTE — Telephone Encounter (Signed)
Dr. Selena Batten please advise on alternative choice and directions! Thank you.

## 2020-05-30 NOTE — Telephone Encounter (Signed)
Prior authorization for Symbicort generic requested. This has been submitted on covermymeds.

## 2020-05-30 NOTE — Telephone Encounter (Signed)
Insurance replied that patient is not covered by their plan. I have left a message for patient to call back. Alternatives given are Breo, Advair HFA, Dulera.

## 2020-05-30 NOTE — Telephone Encounter (Signed)
Sent in Breo 100 1 puff once a day and rinse mouth after each use.

## 2020-06-03 ENCOUNTER — Encounter: Payer: Self-pay | Admitting: Allergy

## 2020-06-06 ENCOUNTER — Other Ambulatory Visit: Payer: Self-pay

## 2020-06-06 ENCOUNTER — Encounter: Payer: Self-pay | Admitting: Family Medicine

## 2020-06-06 ENCOUNTER — Ambulatory Visit (INDEPENDENT_AMBULATORY_CARE_PROVIDER_SITE_OTHER): Payer: Managed Care, Other (non HMO) | Admitting: Family Medicine

## 2020-06-06 VITALS — BP 127/82 | HR 69 | Temp 98.1°F | Resp 18 | Ht 62.0 in | Wt 109.2 lb

## 2020-06-06 DIAGNOSIS — R202 Paresthesia of skin: Secondary | ICD-10-CM

## 2020-06-06 DIAGNOSIS — O165 Unspecified maternal hypertension, complicating the puerperium: Secondary | ICD-10-CM | POA: Diagnosis not present

## 2020-06-06 DIAGNOSIS — R002 Palpitations: Secondary | ICD-10-CM

## 2020-06-06 DIAGNOSIS — T7840XA Allergy, unspecified, initial encounter: Secondary | ICD-10-CM | POA: Diagnosis not present

## 2020-06-06 DIAGNOSIS — Z111 Encounter for screening for respiratory tuberculosis: Secondary | ICD-10-CM

## 2020-06-06 LAB — BASIC METABOLIC PANEL
BUN: 12 mg/dL (ref 6–23)
CO2: 29 mEq/L (ref 19–32)
Calcium: 10.1 mg/dL (ref 8.4–10.5)
Chloride: 100 mEq/L (ref 96–112)
Creatinine, Ser: 0.7 mg/dL (ref 0.40–1.20)
GFR: 115.86 mL/min (ref >60.00)
Glucose, Bld: 226 mg/dL — ABNORMAL HIGH (ref 70–99)
Potassium: 4.2 mEq/L (ref 3.5–5.1)
Sodium: 137 mEq/L (ref 135–145)

## 2020-06-06 LAB — CBC WITH DIFFERENTIAL/PLATELET
Basophils Absolute: 0 10*3/uL (ref 0.0–0.1)
Basophils Relative: 0.9 % (ref 0.0–3.0)
Eosinophils Absolute: 0 10*3/uL (ref 0.0–0.7)
Eosinophils Relative: 1 % (ref 0.0–5.0)
HCT: 35.3 % — ABNORMAL LOW (ref 36.0–46.0)
Hemoglobin: 11.8 g/dL — ABNORMAL LOW (ref 12.0–15.0)
Lymphocytes Relative: 25.4 % (ref 12.0–46.0)
Lymphs Abs: 1.1 10*3/uL (ref 0.7–4.0)
MCHC: 33.5 g/dL (ref 30.0–36.0)
MCV: 89.1 fl (ref 78.0–100.0)
Monocytes Absolute: 0.4 10*3/uL (ref 0.1–1.0)
Monocytes Relative: 8.4 % (ref 3.0–12.0)
Neutro Abs: 2.8 10*3/uL (ref 1.4–7.7)
Neutrophils Relative %: 64.3 % (ref 43.0–77.0)
Platelets: 327 10*3/uL (ref 150.0–400.0)
RBC: 3.96 Mil/uL (ref 3.87–5.11)
RDW: 15.2 % (ref 11.5–15.5)
WBC: 4.3 10*3/uL (ref 4.0–10.5)

## 2020-06-06 LAB — VITAMIN B12: Vitamin B-12: 682 pg/mL (ref 211–911)

## 2020-06-06 LAB — TSH: TSH: 0.73 u[IU]/mL (ref 0.35–4.50)

## 2020-06-06 LAB — VITAMIN D 25 HYDROXY (VIT D DEFICIENCY, FRACTURES): VITD: 39.98 ng/mL (ref 30.00–100.00)

## 2020-06-06 LAB — T4, FREE: Free T4: 0.81 ng/dL (ref 0.60–1.60)

## 2020-06-06 MED ORDER — ENALAPRIL MALEATE 10 MG PO TABS: 10.0000 mg | ORAL_TABLET | Freq: Every day | ORAL | 1 refills | Status: DC

## 2020-06-06 NOTE — Patient Instructions (Signed)
We will call you with results and guide you further.  If normal will place cardio referral.     Palpitations Palpitations are feelings that your heartbeat is not normal. Your heartbeat may feel like it is:  Uneven.  Faster than normal.  Fluttering.  Skipping a beat. This is usually not a serious problem. In some cases, you may need tests to rule out any serious problems. Follow these instructions at home: Pay attention to any changes in your condition. Take these actions to help manage your symptoms: Eating and drinking  Avoid: ? Coffee, tea, soft drinks, and energy drinks. ? Chocolate. ? Alcohol. ? Diet pills. Lifestyle   Try to lower your stress. These things can help you relax: ? Yoga. ? Deep breathing and meditation. ? Exercise. ? Using words and images to create positive thoughts (guided imagery). ? Using your mind to control things in your body (biofeedback).  Do not use drugs.  Get plenty of rest and sleep. Keep a regular bed time. General instructions   Take over-the-counter and prescription medicines only as told by your doctor.  Do not use any products that contain nicotine or tobacco, such as cigarettes and e-cigarettes. If you need help quitting, ask your doctor.  Keep all follow-up visits as told by your doctor. This is important. You may need more tests if palpitations do not go away or get worse. Contact a doctor if:  Your symptoms last more than 24 hours.  Your symptoms occur more often. Get help right away if you:  Have chest pain.  Feel short of breath.  Have a very bad headache.  Feel dizzy.  Pass out (faint). Summary  Palpitations are feelings that your heartbeat is uneven or faster than normal. It may feel like your heart is fluttering or skipping a beat.  Avoid food and drinks that may cause palpitations. These include caffeine, chocolate, and alcohol.  Try to lower your stress. Do not smoke or use drugs.  Get help right away  if you faint or have chest pain, shortness of breath, a severe headache, or dizziness. This information is not intended to replace advice given to you by your health care provider. Make sure you discuss any questions you have with your health care provider. Document Revised: 01/08/2018 Document Reviewed: 01/08/2018 Elsevier Patient Education  2020 [employer].

## 2020-06-06 NOTE — Progress Notes (Signed)
Patient ID: Nicole Carrillo, female  DOB: [birth year], 34 y.o.   MRN: [id number] Patient Care Team    Relationship Specialty Notifications Start End  Ma Hillock, DO PCP - General Family Medicine  02/22/20   Shamleffer, Melanie Crazier, MD Consulting Physician Endocrinology  04/26/20   Garnet Sierras, DO Consulting Physician Allergy  04/28/20     Chief Complaint  Patient presents with  . Dizziness    Pt is having SOB, dizziness, blurred vision, chest pains x3 wks. BP has been running WNL. Pt has been adjusting insulin with Endo, having highs and lows   . Shortness of Breath    Subjective: Nicole Carrillo is a 34 y.o.  female present for acute concern  Pt present today to discuss dizziness and short of breath. She also endorses blurred vision intermittently, chest discomfort bilateral chest, tingling sensation on occassions down bilateral arms/hands and left side of head, and unintentional weight loss. She reports BP has been in normal range on Vasotec 10 mg QD. She has been working with asthma/allergy for better control and recently had to switch inhalers due to insurance coverage. She is working with endocrine in treatment of her diabetes- with reported highs and lows. She has an eye appt next week. She is breastfeeding. She is uncertain if she maintains adequate hydration with her diabetes and breast feeding.   Depression screen [facility] 2/9 02/22/2020 02/17/2020 02/10/2020 01/14/2020 12/30/2019  Decreased Interest 0 0 0 0 0  Down, Depressed, Hopeless 0 0 0 0 0  PHQ - 2 Score 0 0 0 0 0  Altered sleeping - 0 0 0 0  Tired, decreased energy - 0 0 0 0  Change in appetite - 0 0 0 0  Feeling bad or failure about yourself  - 0 0 0 0  Trouble concentrating - 0 0 0 0  Moving slowly or fidgety/restless - 0 0 0 0  Suicidal thoughts - 0 0 0 0  PHQ-9 Score - 0 0 0 0  Difficult doing work/chores - Not difficult at all - - -   GAD 7 : Generalized Anxiety Score 02/17/2020 02/10/2020 01/14/2020 12/30/2019    Nervous, Anxious, on Edge 0 0 0 0  Control/stop worrying 0 0 0 0  Worry too much - different things 0 0 0 0  Trouble relaxing 0 0 0 0  Restless 0 0 0 0  Easily annoyed or irritable 0 0 0 0  Afraid - awful might happen 0 0 0 0  Total GAD 7 Score 0 0 0 0       No flowsheet data found.  Immunization History  Administered Date(s) Administered  . PFIZER SARS-COV-2 Vaccination 03/31/2020, 04/25/2020  . Tdap 12/17/2019   No exam data present  Past Medical History:  Diagnosis Date  . Allergy   . Angio-edema   . Anxiety   . Asthma   . Chicken pox   . Constipation   . Diabetes mellitus type 1 ([place])   . Dysphagia   . Dysthymia   . Eczema   . Elevated platelet count   . Fetal CPAM   . Folliculitis   . GERD (gastroesophageal reflux disease)   . Hashimoto's thyroiditis   . History of migraine   . Hyperlipidemia   . Insulin pump in place   . Left breast mass 01/14/2017   fibroadenoma- benign  . OCD (obsessive compulsive disorder)   . Orthodontics    permanent upper and lower retainers  .  Preeclampsia 02/24/2020  . PVD (posterior vitreous detachment), right   . Rectal bleeding   . Rectal fissure   . Tinnitus   . Urticaria   . Wears contact lenses    Allergies  Allergen Reactions  . Shellfish Allergy Shortness Of Breath and Rash    Crab causes SOB, Scallops cause rash.  Can eat Shrimp and lobster.  . Acetaminophen Other (See Comments)    Effects blood sugars and insulin pump.  . Cephalexin Nausea Only and Other (See Comments)    Headaches, dizziness  . Pneumococcal Vaccines Swelling    Local injection swelling  . Prednisone Nausea Only and Other (See Comments)    Headaches, dizziness, Prefers not to take because of uncontrollable BG's on this med;     Marland Kitchen Soy Allergy Other (See Comments)    Blood test  . Adhesive [Tape] Rash    With extended exposure  . Peanut-Containing Drug Products Rash   Past Surgical History:  Procedure Laterality Date  . DENTAL SURGERY     . ENDOSCOPIC CONCHA BULLOSA RESECTION Bilateral 07/24/2018   Procedure: ENDOSCOPIC CONCHA BULLOSA MIDDLE TURBINATE;  Surgeon: Vernie Murders, MD;  Location: [facility];  Service: ENT;  Laterality: Bilateral;  Diabetic - insulin pump  . SEPTOPLASTY N/A 07/24/2018   Procedure: SEPTOPLASTY;  Surgeon: Vernie Murders, MD;  Location[facility];  Service: ENT;  Laterality: N/A;  NEEDS TO BE FIRST INSULIN PUMP  . TURBINATE REDUCTION Bilateral 07/24/2018   Procedure: INFERIOR TURBINATE REDUCTION;  Surgeon: Vernie Murders, MD;  Location: [facility];  Service: ENT;  Laterality: Bilateral;  . WISDOM TOOTH EXTRACTION     Family History  Problem Relation Age of Onset  . Arthritis Mother   . Stroke Mother   . Miscarriages / [place] Mother   . Arthritis Father   . Dementia Maternal Grandmother   . Emphysema Maternal Grandfather    Social History   Social History Narrative   Marital status/children/pets: Married. Soon to have 1 son.    Education/employment: MSN, Nurse.    Safety:      -smoke alarm in the home:Yes     - wears seatbelt: Yes     - Feels safe in their relationships: Yes    Allergies as of 06/06/2020      Reactions   Shellfish Allergy Shortness Of Breath, Rash   Crab causes SOB, Scallops cause rash.  Can eat Shrimp and lobster.   Acetaminophen Other (See Comments)   Effects blood sugars and insulin pump.   Cephalexin Nausea Only, Other (See Comments)   Headaches, dizziness   Pneumococcal Vaccines Swelling   Local injection swelling   Prednisone Nausea Only, Other (See Comments)   Headaches, dizziness, Prefers not to take because of uncontrollable BG's on this med;    Soy Allergy Other (See Comments)   Blood test   Adhesive [tape] Rash   With extended exposure   Peanut-containing Drug Products Rash      Medication List       Accurate as of June 06, 2020 11:59 PM. If you have any questions, ask your nurse or doctor.        STOP taking these  medications   budesonide-formoterol 80-4.5 MCG/ACT inhaler Commonly known as: Symbicort Stopped by: Felix Pacini, DO   fexofenadine 180 MG tablet Commonly known as: ALLEGRA Stopped by: Felix Pacini, DO     TAKE these medications   albuterol 108 (90 Base) MCG/ACT inhaler Commonly known as: VENTOLIN HFA Inhale into  the lungs every 6 (six) hours as needed for wheezing or shortness of breath.   Azelastine-Fluticasone 137-50 MCG/ACT Susp Place 1 spray into the nose in the morning and at bedtime.   Breo Ellipta 100-25 MCG/INH Aepb Generic drug: fluticasone furoate-vilanterol Inhale 1 puff into the lungs daily. Rinse mouth after each use.   cetirizine 10 MG tablet Commonly known as: ZYRTEC Take 10 mg by mouth 2 (two) times daily.   COLACE PO Take 1 capsule by mouth daily.   enalapril 10 MG tablet Commonly known as: Vasotec Take 1 tablet (10 mg total) by mouth daily. What changed: how much to take Changed by: Felix Pacini, DO   EPINEPHrine 0.3 mg/0.3 mL Soaj injection Commonly known as: Auvi-Q Inject 0.3 mLs (0.3 mg total) into the muscle as needed for anaphylaxis.   insulin pump Soln Inject into the skin continuous. Humalog 100 units/mL   montelukast 10 MG tablet Commonly known as: SINGULAIR Take 1 tablet (10 mg total) by mouth daily.   NON FORMULARY Continuous glucose monitor   prenatal multivitamin Tabs tablet Take 1 tablet by mouth daily at 12 noon.   psyllium 28 % packet Commonly known as: METAMUCIL SMOOTH TEXTURE Take 1 packet by mouth daily.       All past medical history, surgical history, allergies, family history, immunizations andmedications were updated in the EMR today and reviewed under the history and medication portions of their EMR.     No results found.  ROS: 14 pt review of systems performed and negative (unless mentioned in an HPI)  Objective: BP 127/82 (BP Location: Right Arm, Patient Position: Sitting, Cuff Size: Normal)   Pulse 69    Temp 98.1 F (36.7 C) (Temporal)   Resp 18   Ht 5\' 2"  (1.575 m)   Wt 109 lb 4 oz (49.6 kg)   SpO2 99%   Breastfeeding Yes   BMI 19.98 kg/m  Gen: Afebrile. No acute distress. Nontoxic female.  HENT: AT. [place]. Bilateral TM visualized and normal in appearance. MMM. Bilateral nares without erythema. Throat without erythema or exudates. No cough or hoarseness, no shortness of breath Eyes:Pupils Equal Round Reactive to light, Extraocular movements intact,  Conjunctiva without redness, discharge or icterus. Neck/lymp/endocrine: Supple,no lymphadenopathy, no thyromegaly CV: RRR no murmur, no edema Chest: CTAB, no wheeze or crackles  Neuro: Normal gait. PERLA. EOMi. Alert. Oriented x3 Psych: Normal affect, dress and demeanor. Normal speech. Normal thought content and judgment.   Assessment/plan: Nicole Carrillo is a 34 y.o. female present for hypertension Continue vasotec.   Palpitations Described sensation in her chest could be consistent with palpitations.  - TSH - Basic Metabolic Panel (BMET) - Iron, TIBC and Ferritin Panel - T4, free - CBC w/Diff If normal work up, will refer to cardio for further eval  Tingling in extremities/Paresthesia Poss vitamin def, endocrine or stress related. - B12 - Vitamin D (25 hydroxy) - TSH - Iron, TIBC and Ferritin Panel - CBC w/Diff  Screening-pulmonary TB Needed for school - QuantiFERON-TB Gold Plus  Follow up dependent on lab results.   Orders Placed This Encounter  Procedures  . B12  . Vitamin D (25 hydroxy)  . TSH  . Basic Metabolic Panel (BMET)  . Iron, TIBC and Ferritin Panel  . T4, free  . CBC w/Diff  . QuantiFERON-TB Gold Plus   Meds ordered this encounter  Medications  . enalapril (VASOTEC) 10 MG tablet    Sig: Take 1 tablet (10 mg total) by mouth daily.  Dispense:  90 tablet    Refill:  1   Referral Orders  No referral(s) requested today     Note is dictated utilizing voice recognition software. Although note  has been proof read prior to signing, occasional typographical errors still can be missed. If any questions arise, please do not hesitate to call for verification.  Electronically signed by: Felix Pacini, DO [facility] [place]

## 2020-06-07 ENCOUNTER — Telehealth: Payer: Self-pay | Admitting: Family Medicine

## 2020-06-07 LAB — IRON,TIBC AND FERRITIN PANEL
%SAT: 40 % (calc) (ref 16–45)
Ferritin: 70 ng/mL (ref 16–154)
Iron: 107 ug/dL (ref 40–190)
TIBC: 269 mcg/dL (calc) (ref 250–450)

## 2020-06-07 NOTE — Telephone Encounter (Signed)
Patient advised, voiced understanding.

## 2020-06-07 NOTE — Telephone Encounter (Signed)
Patient advised of lab results and provider recommendations and referrals.   She is a bit concerned about the anemia since she has not started a menstrual cycle since child birth.  She is asking if she should start an iron supplement?   Please advise.  Patient states she will be in class this afternoon and she states that it is okay to leave detailed message on cell if she doesn't answer

## 2020-06-07 NOTE — Telephone Encounter (Signed)
Please inform patient Her kidney function and electrolytes are normal, with the exception of her glucose was 226 at the time of appointment. Vitamin D is normal at 40 B12 is normal at 682 TSH/T4-thyroid levels are normal Iron levels are normal. Hydration levels appear adequate Blood cell counts >hemoglobin/hematocrit is technically mildly below normal with a hemoglobin 11.8 (normal is greater than 12) and hematocrit 35.3 (normal greater than 36).  This level should not be causing the symptoms she is experiencing.  This is just barely any anemic category and not far off from her prior collection over the last year.  -If her menstrual cycles have restarted since the birth of her child and they are heavy, could be the cause of the mild anemia.  If so, she should speak to her gynecologist.  Since her labs are basically normal, I have referred her to cardiology as we discussed during her appointment.  They will be reaching out to contact her.  I would also encourage her to talk to her asthma specialist if shortness of breath/chest tightness continues.

## 2020-06-07 NOTE — Telephone Encounter (Signed)
Her iron is normal. Her iron stores are well stocked. I would not recommend an iron supplement at this time. Too much iron is toxic to the system.  Again, CBC is comparable to her CBC results the past year. We would monitor in 3 levels in 3 months.

## 2020-06-10 LAB — QUANTIFERON-TB GOLD PLUS
QuantiFERON Mitogen Value: 6.24 IU/mL
QuantiFERON Nil Value: 0 IU/mL
QuantiFERON TB1 Ag Value: 0.03 IU/mL
QuantiFERON TB2 Ag Value: 0.03 IU/mL
QuantiFERON-TB Gold Plus: NEGATIVE

## 2020-06-17 LAB — HEPATITIS B SURFACE ANTIGEN: Hepatitis B Surface Ag: NEGATIVE

## 2020-06-24 ENCOUNTER — Encounter: Payer: Self-pay | Admitting: Family Medicine

## 2020-06-24 DIAGNOSIS — R0789 Other chest pain: Secondary | ICD-10-CM

## 2020-06-24 DIAGNOSIS — R002 Palpitations: Secondary | ICD-10-CM

## 2020-06-24 NOTE — Telephone Encounter (Signed)
I do not see evidence of the cardiology referral either.  I have placed it again today.  Please make patient aware. Thanks.

## 2020-06-24 NOTE — Addendum Note (Signed)
Addended by: Felix Pacini A on: 06/24/2020 05:31 PM   Modules accepted: Orders

## 2020-06-24 NOTE — Telephone Encounter (Signed)
I do not see where referral was placed for cardiology. Please advise.

## 2020-06-30 ENCOUNTER — Ambulatory Visit: Payer: Managed Care, Other (non HMO) | Admitting: Allergy

## 2020-06-30 ENCOUNTER — Encounter: Payer: Self-pay | Admitting: Allergy

## 2020-06-30 ENCOUNTER — Other Ambulatory Visit: Payer: Self-pay

## 2020-06-30 VITALS — BP 122/72 | HR 81 | Resp 18

## 2020-06-30 DIAGNOSIS — R0989 Other specified symptoms and signs involving the circulatory and respiratory systems: Secondary | ICD-10-CM

## 2020-06-30 DIAGNOSIS — L503 Dermatographic urticaria: Secondary | ICD-10-CM | POA: Diagnosis not present

## 2020-06-30 DIAGNOSIS — T781XXD Other adverse food reactions, not elsewhere classified, subsequent encounter: Secondary | ICD-10-CM | POA: Diagnosis not present

## 2020-06-30 DIAGNOSIS — J45909 Unspecified asthma, uncomplicated: Secondary | ICD-10-CM

## 2020-06-30 DIAGNOSIS — J3089 Other allergic rhinitis: Secondary | ICD-10-CM

## 2020-06-30 MED ORDER — ALBUTEROL SULFATE HFA 108 (90 BASE) MCG/ACT IN AERS: 2.0000 | INHALATION_SPRAY | RESPIRATORY_TRACT | 2 refills | Status: DC | PRN

## 2020-06-30 MED ORDER — FAMOTIDINE 20 MG PO TABS
20.0000 mg | ORAL_TABLET | Freq: Two times a day (BID) | ORAL | 5 refills | Status: DC
Start: 2020-06-30 — End: 2020-09-12

## 2020-06-30 NOTE — Assessment & Plan Note (Addendum)
Past history - 2018 skin testing positive to soy, peanut and borderline to wheat. She had no prior clinical reactions to these foods. Avoiding certain cocoa products as well due to possibility of causing the chest tightness. Avoiding peanuts, tree nuts and sesame since then but interested in re-introduction. 2021 bloodwork borderline positive to hazelnut, peanut, macademia nut, pistachios and soy. Positive to sesame seed. Interim history - avoiding peanuts, tree nuts and sesame. Eating some soy containing foods and not sure if it's causing any reactions.  Continue to avoid sesame, peanuts and tree nuts.  See if you have more reactions after consuming products with soy.   For mild symptoms you can take over the counter antihistamines such as Benadryl and monitor symptoms closely. If symptoms worsen or if you have severe symptoms including breathing issues, throat closure, significant swelling, whole body hives, severe diarrhea and vomiting, lightheadedness then inject epinephrine and seek immediate medical care afterwards.  Food action plan in place.   Plan on skin testing to tree nuts, peanuts, soy and sesame at next visit.

## 2020-06-30 NOTE — Assessment & Plan Note (Signed)
Still having flares at times.  Continue with Zyrtec (cetirizine) 10mg  twice a day as above.  Start famotidine 20mg  twice a day.  Continue proper skin care.

## 2020-06-30 NOTE — Progress Notes (Signed)
Follow Up Note  RE: Blaine Guiffre MRN: [id number] DOB: 1986/05/03 Date of Office Visit: 06/30/2020  Referring provider: Natalia Leatherwood, DO Primary care provider: Natalia Leatherwood, DO  Chief Complaint: Asthma and Allergic Rhinitis   History of Present Illness: I had the pleasure of seeing [name] for a follow up visit at the [facility] on 06/30/2020. She is a 34 y.o. female, who is being followed for asthma, allergic rhino conjunctivitis, dermatographism, adverse food reaction, globus sensation. Her previous allergy office visit was on 04/28/2020 with Dr. Selena Batten. Today is a regular follow up visit.  Asthma Sometimes feeling shortness of breath and chest pressure which lasts for about 30 minutes without medications. Sometimes uses albuterol when the symptoms are more severe and lasting longer than normal which seems to help. Using albuterol 1-2 times a month.   Currently on Breo 1 puff once a day and rinsing mouth after each use with some benefit.  Symbicort caused some lightheadedness.   Taking Singulair in the mornings. Denies any reflux/heartburn issues.  Other allergic rhinitis Taking zyrtec 10mg  BID and sometimes takes benadryl at night. Taking dymista 1 spray twice a day and sometimes makes her nauseous for 30 minutes.  Breastfeeding a 62 month old baby.   Dermatographism Still there at times.   Adverse food reaction One episode throat discomfort after eating a burnt toast. She had toast since then with no issues.  Currently avoiding peanuts, tree nuts, sesame. Not avoiding soy.  She is trying to gain weight but difficult to do so as she's also breastfeeding.   No Epipen use.  Assessment and Plan: [name] is a 33 y.o. female with: Asthma Past history - Diagnosed with asthma in 2018 by pulmonology via ? Methacholine challenge test - data not available for review during OV. Tried Arnuity, Breo with no benefit. No triggers noted.  Resolved during pregnancy. Now mainly complaining of chest pressure/tightness which sometimes resolves on its own with no medical therapy but takes 30 minutes. Tried Pepcid for few months with no benefit. Normal EGD in past per patient report. 2021 spirometry was normal with marginal improvement in FEV1 post bronchodilator treatment. Clinically feeling about the same. Interim history - Tried Symbicort but caused lightheadedness and due to insurance coverage is now on Breo 2022 1 puff daily with some benefit. Still having episodes of shortness of breath and chest tightness but not using albuterol.  Today's spirometry was unremarkable. ACT score 18. . Daily controller medication(s): continue Breo 1 puff once a day and rinse mouth after each use.  . Continue with Singulair 10mg  daily at night.  . May use albuterol rescue inhaler 2 puffs every 4 to 6 hours as needed for shortness of breath, chest tightness, coughing, and wheezing. May use albuterol rescue inhaler 2 puffs 5 to 15 minutes prior to strenuous physical activities. Monitor frequency of use.  . Discussed with patient to use albuterol during the shortness of breath and chest tightness episodes. If no improvement then most likely symptoms not triggered by asthma.  . Repeat spirometry at next visit.  . If no improvement, will try PPI next.  Adverse food reaction Past history - 2018 skin testing positive to soy, peanut and borderline to wheat. She had no prior clinical reactions to these foods. Avoiding certain cocoa products as well due to possibility of causing the chest tightness. Avoiding peanuts, tree nuts and sesame since then but interested in re-introduction. 2021 bloodwork borderline positive to  hazelnut, peanut, macademia nut, pistachios and soy. Positive to sesame seed. Interim history - avoiding peanuts, tree nuts and sesame. Eating some soy containing foods and not sure if it's causing any reactions.  Continue to avoid sesame,  peanuts and tree nuts.  See if you have more reactions after consuming products with soy.   For mild symptoms you can take over the counter antihistamines such as Benadryl and monitor symptoms closely. If symptoms worsen or if you have severe symptoms including breathing issues, throat closure, significant swelling, whole body hives, severe diarrhea and vomiting, lightheadedness then inject epinephrine and seek immediate medical care afterwards.  Food action plan in place.   Plan on skin testing to tree nuts, peanuts, soy and sesame at next visit.  Other allergic rhinitis Past history - Perennial rhino conjunctivitis symptoms for the past 3 years. 2018 skin testing by ENT was positive to grass, ragweed, weed, trees, molds, dust mites, cockroach, horse. SLIT therapy by ENT x few months with no benefit. Deviated septum repair and turbinate reduction in 2019. 2021 bloodwork positive to dust mites, grass pollen, mold, tree pollen, ragweed pollen and weed pollen.  Interim history - Dymista making her nauseous for 30 minutes after use. Not sure if helping.  Continue environmental control measures.  Read about allergy injections - handout given.   Continue with Zyrtec (cetirizine) 10mg  twice a day.  Stop dymista for now. If you notice worsening symptoms then restart.   Dermatographism Still having flares at times.  Continue with Zyrtec (cetirizine) 10mg  twice a day as above.  Start famotidine 20mg  twice a day.  Continue proper skin care.  Return in about 3 months (around 09/30/2020).  Meds ordered this encounter  Medications  . albuterol (VENTOLIN HFA) 108 (90 Base) MCG/ACT inhaler    Sig: Inhale 2 puffs into the lungs every 4 (four) hours as needed for wheezing or shortness of breath.    Dispense:  18 g    Refill:  2  . famotidine (PEPCID) 20 MG tablet    Sig: Take 1 tablet (20 mg total) by mouth 2 (two) times daily.    Dispense:  60 tablet    Refill:  5    Lab Orders      Tryptase  Diagnostics: Spirometry:  Tracings reviewed. Her effort: It was hard to get consistent efforts and there is a question as to whether this reflects a maximal maneuver. FVC: 2.84L FEV1: 2.78L, 113% predicted FEV1/FVC ratio: 98% Interpretation: Spirometry consistent with normal pattern.  Please see scanned spirometry results for details.  Medication List:  Current Outpatient Medications  Medication Sig Dispense Refill  . albuterol (VENTOLIN HFA) 108 (90 Base) MCG/ACT inhaler Inhale 2 puffs into the lungs every 4 (four) hours as needed for wheezing or shortness of breath. 18 g 2  . cetirizine (ZYRTEC) 10 MG tablet Take 10 mg by mouth 2 (two) times daily.     Sodium (COLACE PO) Take 1 capsule by mouth daily.    . enalapril (VASOTEC) 10 MG tablet Take 1 tablet (10 mg total) by mouth daily. 90 tablet 1  . EPINEPHrine (AUVI-Q) 0.3 mg/0.3 mL IJ SOAJ injection Inject 0.3 mLs (0.3 mg total) into the muscle as needed for anaphylaxis. 1 each 2  . fluticasone furoate-vilanterol (BREO ELLIPTA) 100-25 MCG/INH AEPB Inhale 1 puff into the lungs daily. Rinse mouth after each use. 60 each 5  . Insulin Human (INSULIN PUMP) SOLN Inject into the skin continuous. Humalog 100 units/mL    .  Microlet Lancets MISC     . montelukast (SINGULAIR) 10 MG tablet Take 1 tablet (10 mg total) by mouth daily. 90 tablet 1  . NON FORMULARY Continuous glucose monitor    . Prenatal Vit-Fe Fumarate-FA (PRENATAL MULTIVITAMIN) TABS tablet Take 1 tablet by mouth daily at 12 noon.    . psyllium (METAMUCIL SMOOTH TEXTURE) 28 % packet Take 1 packet by mouth daily.    . Azelastine-Fluticasone 137-50 MCG/ACT SUSP Place 1 spray into the nose in the morning and at bedtime. (Patient not taking: Reported on 06/30/2020) 23 g 5  . famotidine (PEPCID) 20 MG tablet Take 1 tablet (20 mg total) by mouth 2 (two) times daily. 60 tablet 5   No current facility-administered medications for this visit.   Allergies: Allergies    Allergen Reactions  . Shellfish Allergy Shortness Of Breath and Rash    Crab causes SOB, Scallops cause rash.  Can eat Shrimp and lobster.  . Acetaminophen Other (See Comments)    Effects blood sugars and insulin pump.  . Cephalexin Nausea Only and Other (See Comments)    Headaches, dizziness  . Pneumococcal Vaccines Swelling    Local injection swelling  . Prednisone Nausea Only and Other (See Comments)    Headaches, dizziness, Prefers not to take because of uncontrollable BG's on this med;     Marland Kitchen. Soy Allergy Other (See Comments)    Blood test  . Adhesive [Tape] Rash    With extended exposure  . Peanut-Containing Drug Products Rash   I reviewed her past medical history, social history, family history, and environmental history and no significant changes have been reported from her previous visit.  Review of Systems  Constitutional: Negative for appetite change, chills, fever and unexpected weight change.  HENT: Negative for congestion and rhinorrhea.   Eyes: Negative for itching.  Respiratory: Positive for chest tightness and shortness of breath. Negative for cough and wheezing.   Cardiovascular: Negative for chest pain.  Gastrointestinal: Negative for abdominal pain.  Genitourinary: Negative for difficulty urinating.  Skin: Negative for rash.  Allergic/Immunologic: Positive for environmental allergies.  Neurological: Negative for headaches.   Objective: BP 122/72   Pulse 81   Resp 18   SpO2 98%  There is no height or weight on file to calculate BMI. Physical Exam Vitals and nursing note reviewed.  Constitutional:      Appearance: Normal appearance. She is well-developed.  HENT:     Head: Normocephalic and atraumatic.     Right Ear: Tympanic membrane and external ear normal.     Left Ear: Tympanic membrane and external ear normal.     Nose: Nose normal.     Mouth/Throat:     Mouth: Mucous membranes are moist.     Pharynx: Oropharynx is clear.  Eyes:      Conjunctiva/sclera: Conjunctivae normal.  Cardiovascular:     Rate and Rhythm: Normal rate and regular rhythm.     Heart sounds: Normal heart sounds. No murmur heard.   Pulmonary:     Effort: Pulmonary effort is normal.     Breath sounds: Normal breath sounds. No wheezing, rhonchi or rales.  Musculoskeletal:     Cervical back: Neck supple.  Skin:    General: Skin is warm.     Findings: No rash.  Neurological:     Mental Status: She is alert and oriented to person, place, and time.  Psychiatric:        Mood and Affect: Mood normal.  Behavior: Behavior normal.    Previous notes and tests were reviewed. The plan was reviewed with the patient/family, and all questions/concerned were addressed.  It was my pleasure to see Nicole Carrillo today and participate in her care. Please feel free to contact me with any questions or concerns.  Sincerely,  Wyline Mood, DO Allergy & Immunology  [facility] office: [phone] [facility] office: [phone] [place] office: [phone]

## 2020-06-30 NOTE — Assessment & Plan Note (Addendum)
Past history - Perennial rhino conjunctivitis symptoms for the past 3 years. 2018 skin testing by ENT was positive to grass, ragweed, weed, trees, molds, dust mites, cockroach, horse. SLIT therapy by ENT x few months with no benefit. Deviated septum repair and turbinate reduction in 2019. 2021 bloodwork positive to dust mites, grass pollen, mold, tree pollen, ragweed pollen and weed pollen.  Interim history - Dymista making her nauseous for 30 minutes after use. Not sure if helping.  Continue environmental control measures.  Read about allergy injections - handout given.   Continue with Zyrtec (cetirizine) 10mg  twice a day.  Stop dymista for now. If you notice worsening symptoms then restart.

## 2020-06-30 NOTE — Assessment & Plan Note (Addendum)
Past history - Diagnosed with asthma in 2018 by pulmonology via ? Methacholine challenge test - data not available for review during OV. Tried Arnuity, Breo with no benefit. No triggers noted. Resolved during pregnancy. Now mainly complaining of chest pressure/tightness which sometimes resolves on its own with no medical therapy but takes 30 minutes. Tried Pepcid for few months with no benefit. Normal EGD in past per patient report. 2021 spirometry was normal with marginal improvement in FEV1 post bronchodilator treatment. Clinically feeling about the same. Interim history - Tried Symbicort but caused lightheadedness and due to insurance coverage is now on Breo 1 puff daily with some benefit. Still having episodes of shortness of breath and chest tightness but not using albuterol.  Today's spirometry was unremarkable. ACT score 18. . Daily controller medication(s): continue Breo 1 puff once a day and rinse mouth after each use.  . Continue with Singulair 10mg  daily at night.  . May use albuterol rescue inhaler 2 puffs every 4 to 6 hours as needed for shortness of breath, chest tightness, coughing, and wheezing. May use albuterol rescue inhaler 2 puffs 5 to 15 minutes prior to strenuous physical activities. Monitor frequency of use.  . Discussed with patient to use albuterol during the shortness of breath and chest tightness episodes. If no improvement then most likely symptoms not triggered by asthma.  . Repeat spirometry at next visit.  . If no improvement, will try PPI next.

## 2020-06-30 NOTE — Patient Instructions (Addendum)
Asthma:  . Daily controller medication(s): continue Breo 1 puff once a day and rinse mouth after each use.  . Continue with Singulair 10mg  daily at night.  . May use albuterol rescue inhaler 2 puffs every 4 to 6 hours as needed for shortness of breath, chest tightness, coughing, and wheezing. May use albuterol rescue inhaler 2 puffs 5 to 15 minutes prior to strenuous physical activities. Monitor frequency of use.  . Asthma control goals:  o Full participation in all desired activities (may need albuterol before activity) o Albuterol use two times or less a week on average (not counting use with activity) o Cough interfering with sleep two times or less a month o Oral steroids no more than once a year o No hospitalizations  Environmental allergies:  Continue environmental control measures - grass, ragweed, weed, trees, molds, dust mites, cockroach, horse.  Read about allergy injections.   Continue with Zyrtec (cetirizine) 10mg  twice a day.  Stop dymista for now. If you notice worsening symptoms then restart.   Food allergies:  Continue to avoid sesame, peanuts and tree nuts.  See if you have more reactions after consuming products with soy.   For mild symptoms you can take over the counter antihistamines such as Benadryl and monitor symptoms closely. If symptoms worsen or if you have severe symptoms including breathing issues, throat closure, significant swelling, whole body hives, severe diarrhea and vomiting, lightheadedness then inject epinephrine and seek immediate medical care afterwards.  Food action plan in place.   Skin   See below for proper skin care.  Start famotidine 20mg  twice a day and see if it helps with the itchy skin/rash.   . Get bloodwork for tryptase.  o We are ordering labs, so please allow 1-2 weeks for the results to come back. o With the newly implemented Cures Act, the labs might be visible to you at the same time that they become visible to me.  However, I will not address the results until all of the results are back, so please be patient.   Follow up in 3 months or sooner if needed.   Reducing Pollen Exposure . Pollen seasons: trees (spring), grass (summer) and ragweed/weeds (fall). Keep windows closed in your home and car to lower pollen exposure.  [birth year]1982 air conditioning in the bedroom and throughout the house if possible.  . Avoid going out in dry windy days - especially early morning. . Pollen counts are highest between 5 - 10 AM and on dry, hot and windy days.  . Save outside activities for late afternoon or after a heavy rain, when pollen levels are lower.  . Avoid mowing of grass if you have grass pollen allergy. [birth year]1981 Be aware that pollen can also be transported indoors on people and pets.  . Dry your clothes in an automatic dryer rather than hanging them outside where they might collect pollen.  . Rinse hair and eyes before bedtime. Control of House Dust Mite Allergen . Dust mite allergens are a common trigger of allergy and asthma symptoms. While they can be found throughout the house, these microscopic creatures thrive in warm, humid environments such as bedding, upholstered furniture and carpeting. . Because so much time is spent in the bedroom, it is essential to reduce mite levels there.  . Encase pillows, mattresses, and box springs in special allergen-proof fabric covers or airtight, zippered plastic covers.  . Bedding should be washed weekly in hot water (130 F) and dried in  a hot dryer. Allergen-proof covers are available for comforters and pillows that can't be regularly washed.  Reyes Ivan the allergy-proof covers every few months. Minimize clutter in the bedroom. Keep pets out of the bedroom.  Marland Kitchen Keep humidity less than 50% by using a dehumidifier or air conditioning. You can buy a humidity measuring device called a hygrometer to monitor this.  . If possible, replace carpets with hardwood, linoleum, or washable  area rugs. If that's not possible, vacuum frequently with a vacuum that has a HEPA filter. . Remove all upholstered furniture and non-washable window drapes from the bedroom. . Remove all non-washable stuffed toys from the bedroom.  Wash stuffed toys weekly. Mold Control . Mold and fungi can grow on a variety of surfaces provided certain temperature and moisture conditions exist.  . Outdoor molds grow on plants, decaying vegetation and soil. The major outdoor mold, Alternaria and Cladosporium, are found in very high numbers during hot and dry conditions. Generally, a late summer - fall peak is seen for common outdoor fungal spores. Rain will temporarily lower outdoor mold spore count, but counts rise rapidly when the rainy period ends. . The most important indoor molds are Aspergillus and Penicillium. Dark, humid and poorly ventilated basements are ideal sites for mold growth. The next most common sites of mold growth are the bathroom and the kitchen. Outdoor (Seasonal) Mold Control . Use air conditioning and keep windows closed. . Avoid exposure to decaying vegetation. Marland Kitchen Avoid leaf raking. . Avoid grain handling. . Consider wearing a face mask if working in moldy areas.  Indoor (Perennial) Mold Control  . Maintain humidity below 50%. . Get rid of mold growth on hard surfaces with water, detergent and, if necessary, 5% bleach (do not mix with other cleaners). Then dry the area completely. If mold covers an area more than 10 square feet, consider hiring an indoor environmental professional. . For clothing, washing with soap and water is best. If moldy items cannot be cleaned and dried, throw them away. . Remove sources e.g. contaminated carpets. . Repair and seal leaking roofs or pipes. Using dehumidifiers in damp basements may be helpful, but empty the water and clean units regularly to prevent mildew from forming. All rooms, especially basements, bathrooms and kitchens, require ventilation and  cleaning to deter mold and mildew growth. Avoid carpeting on concrete or damp floors, and storing items in damp areas. Cockroach Allergen Avoidance Cockroaches are often found in the homes of densely populated urban areas, schools or commercial buildings, but these creatures can lurk almost anywhere. This does not mean that you have a dirty house or living area. . Block all areas where roaches can enter the home. This includes crevices, wall cracks and windows.  . Cockroaches need water to survive, so fix and seal all leaky faucets and pipes. Have an exterminator go through the house when your family and pets are gone to eliminate any remaining roaches. Marland Kitchen Keep food in lidded containers and put pet food dishes away after your pets are done eating. Vacuum and sweep the floor after meals, and take out garbage and recyclables. Use lidded garbage containers in the kitchen. Wash dishes immediately after use and clean under stoves, refrigerators or toasters where crumbs can accumulate. Wipe off the stove and other kitchen surfaces and cupboards regularly.   Skin care recommendations  Bath time: . Always use lukewarm water. AVOID very hot or cold water. Marland Kitchen Keep bathing time to 5-10 minutes. . Do NOT use bubble bath. Marland Kitchen  Use a mild soap and use just enough to wash the dirty areas. . Do NOT scrub skin vigorously.  . After bathing, pat dry your skin with a towel. Do NOT rub or scrub the skin.  Moisturizers and prescriptions:  . ALWAYS apply moisturizers immediately after bathing (within 3 minutes). This helps to lock-in moisture. . Use the moisturizer several times a day over the whole body. Peri [place] summer moisturizers include: Aveeno, CeraVe, Cetaphil. Peri [place] winter moisturizers include: Aquaphor, Vaseline, Cerave, Cetaphil, Eucerin, Vanicream. . When using moisturizers along with medications, the moisturizer should be applied about one hour after applying the medication to prevent diluting effect of the  medication or moisturize around where you applied the medications. When not using medications, the moisturizer can be continued twice daily as maintenance.  Laundry and clothing: . Avoid laundry products with added color or perfumes. . Use unscented hypo-allergenic laundry products such as Tide free, Cheer free & gentle, and All free and clear.  . If the skin still seems dry or sensitive, you can try double-rinsing the clothes. . Avoid tight or scratchy clothing such as wool. . Do not use fabric softeners or dyer sheets.

## 2020-07-02 LAB — TRYPTASE: Tryptase: 11.5 ug/L (ref 2.2–13.2)

## 2020-07-05 ENCOUNTER — Ambulatory Visit: Payer: Managed Care, Other (non HMO) | Admitting: Family Medicine

## 2020-07-14 ENCOUNTER — Encounter: Payer: Self-pay | Admitting: Family Medicine

## 2020-07-14 NOTE — Telephone Encounter (Signed)
She can come off the enalapril. Continue to watch the blood pressures and if routinely above 135/85 she should restart enalapril 5 mg daily.

## 2020-07-14 NOTE — Telephone Encounter (Signed)
Patient was last seen 06/06/20, refill sent for #90 with 1 refill same day. She has sent the following message regarding BP concern:  Hi Dr. Claiborne Billings, Here is a list of BPs that I have been taking in the mornings.  I usually take my enalipril around 11 am.  Last couple of days I have taken afternoon/evening BPs.   I switched myself from 10mg  to 5 mg back in early July. I would like your thoughts about completely stopping the BP med and continuing to monitor my BP to make sure it continues WNL.  Thank you  Changed from 10mg  to 5 on 7/5. 7/12    122/72 (78) 7/14    120/75 (78) 7/15    129/78 (86) 7/16    115/73 (95) 7/18    124/70 (94) 7/20    124/74 (79) 7/21    117/70 (88) 7/22    125/75 (85) 7/23    116/74 (87) 7/26.    124/74 (75) 7/27    116/69 (75) 7/28     125/72 (67) 7/30     128/78 (78) 7/31     117/79 (93) 8/2        128/75 (86).     PM   109/63 (71) 8/3        125/75 (77).     PM 118/67 (67) 8/4        121/67 (82) 8/5         117/70 (77).    PM  127/67 (72)

## 2020-07-15 NOTE — Telephone Encounter (Signed)
MyChart message read.

## 2020-07-20 ENCOUNTER — Ambulatory Visit: Payer: Managed Care, Other (non HMO) | Admitting: Cardiology

## 2020-07-28 ENCOUNTER — Ambulatory Visit (INDEPENDENT_AMBULATORY_CARE_PROVIDER_SITE_OTHER): Payer: Managed Care, Other (non HMO) | Admitting: Internal Medicine

## 2020-07-28 ENCOUNTER — Other Ambulatory Visit: Payer: Self-pay

## 2020-07-28 ENCOUNTER — Encounter: Payer: Self-pay | Admitting: Internal Medicine

## 2020-07-28 VITALS — BP 118/70 | HR 94 | Resp 18 | Ht 62.0 in | Wt 103.6 lb

## 2020-07-28 DIAGNOSIS — E109 Type 1 diabetes mellitus without complications: Secondary | ICD-10-CM | POA: Diagnosis not present

## 2020-07-28 LAB — POCT GLYCOSYLATED HEMOGLOBIN (HGB A1C): Hemoglobin A1C: 7 % — AB (ref 4.0–5.6)

## 2020-07-28 NOTE — Progress Notes (Signed)
Name: Nicole Carrillo  Age/ Sex: 34 y.o., female   MRN/ DOB: [id number], [birth year]     PCP: Natalia Leatherwood, DO   Reason for Endocrinology Evaluation: Type 1 Diabetes Mellitus  Initial Endocrine Consultative Visit: 8/15/221    PATIENT IDENTIFIER: Nicole Carrillo is a 34 y.o. female with a past medical history of T1DM and Hashimoto's Thyroiditis. The patient has followed with Endocrinology clinic since 04/26/2020 for consultative assistance with management of her diabetes.  DIABETIC HISTORY:  Nicole Carrillo was initially diagnosed with T2DM at age 37 but this diagnosis was subsequently changed to T1DM after hospitalization for hyperglycemia and HHS. It is unclear to me the circumstances of her diagnosis. She has been on insulin pumps for  Years. Her hemoglobin A1c has ranged from 6.6% in 2021, peaking at 7.7% in 2020.   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 We adjusted pump settings  Today (07/28/2020): Nicole Carrillo is here for a follow up on diabetes management.   She checks her blood sugars multiple  times daily through the [employer] CGM. The patient has had hypoglycemic episodes since the last clinic visit, which typically occur without a specific pattern and mainly during the day. She continues to nurse.   She has increased her pump settings as below. Pt admits to under estimating CHO intake due to fear of hypoglycemia.      HOME DIABETES REGIMEN:  HUmalog   Pump   MEDTRONIC   Settings   Insulin type   Humalog    Basal rate       0000-0400  0.200 u/h    0400-0700  0.325 u/h ( self increased to 0.350)   0700-1200  0.550 u/h ( 0.675)   1200-1800 0.550 u/h (0.600)   1800-0000 0.450          I:C ratio       0000-0700  1:13   0700-1100 8.5 ( self changed to 7.5)   1100-1200  10   1200-0000 10       Sensitivity       0000  60      AIT     0000  4            Type & Model of Pump: Medtronic 770 Insulin Type: Currently using Humalog    PUMP STATISTICS: Unable to download due to loss of Internet connection     Statin: no ACE-I/ARB: yes     DIABETIC COMPLICATIONS: Microvascular complications:   Psoterior retinal detachment and bleed that is attributed to HTN  Denies: CKD, neuropathy  Last Eye Exam: Completed 01/2020  Macrovascular complications:    Denies: CAD, CVA, PVD   HISTORY:  Past Medical History:  Past Medical History:  Diagnosis Date  . Allergy   . Angio-edema   . Anxiety   . Asthma   . Chicken pox   . Constipation   . Diabetes mellitus type 1 (HCC)   . Dysphagia   . Dysthymia   . Eczema   . Elevated platelet count   . Fetal CPAM   . Folliculitis   . GERD (gastroesophageal reflux disease)   . Hashimoto's thyroiditis   . History of migraine   . Hyperlipidemia   . Insulin pump in place   . Left breast mass 01/14/2017   fibroadenoma- benign  .  OCD (obsessive compulsive disorder)   . Orthodontics    permanent upper and lower retainers  . Preeclampsia 02/24/2020  . PVD (posterior vitreous detachment), right   . Rectal bleeding   . Rectal fissure   . Tinnitus   . Urticaria   . Wears contact lenses     Past Surgical History:  Past Surgical History:  Procedure Laterality Date  . DENTAL SURGERY    . ENDOSCOPIC CONCHA BULLOSA RESECTION Bilateral 07/24/2018   Procedure: ENDOSCOPIC CONCHA BULLOSA MIDDLE TURBINATE;  Surgeon: Vernie MurdersJuengel, Paul, MD;  [facility];  Service: ENT;  Laterality: Bilateral;  Diabetic - insulin pump  . SEPTOPLASTY N/A 07/24/2018   Procedure: SEPTOPLASTY;  Surgeon: Vernie MurdersJuengel, Paul, MD;  [facility];  Service: ENT;  Laterality: N/A;  NEEDS TO BE FIRST INSULIN PUMP  . TURBINATE REDUCTION Bilateral 07/24/2018   Procedure: INFERIOR TURBINATE REDUCTION;  Surgeon:  Vernie MurdersJuengel, Paul, MD;  [facility];  Service: ENT;  Laterality: Bilateral;  . WISDOM TOOTH EXTRACTION       Social History:  reports that she has never smoked. She has never used smokeless tobacco. She reports previous alcohol use of about 1.0 standard drink of alcohol per week. She reports previous drug use. Family History:  Family History  Problem Relation Age of Onset  . Arthritis Mother   . Stroke Mother   . Miscarriages / [place] Mother   . Arthritis Father   . Dementia Maternal Grandmother   . Emphysema Maternal Grandfather       HOME MEDICATIONS: Allergies as of 07/28/2020      Reactions   Shellfish Allergy Shortness Of Breath, Rash   Crab causes SOB, Scallops cause rash.  Can eat Shrimp and lobster.   Acetaminophen Other (See Comments)   Effects blood sugars and insulin pump.   Cephalexin Nausea Only, Other (See Comments)   Headaches, dizziness   Pneumococcal Vaccines Swelling   Local injection swelling   Prednisone Nausea Only, Other (See Comments)   Headaches, dizziness, Prefers not to take because of uncontrollable BG's on this med;    Soy Allergy Other (See Comments)   Blood test   Adhesive [tape] Rash   With extended exposure   Peanut-containing Drug Products Rash      Medication List       Accurate as of July 28, 2020  7:36 AM. If you have any questions, ask your nurse or doctor.        albuterol 108 (90 Base) MCG/ACT inhaler Commonly known as: VENTOLIN HFA Inhale 2 puffs into the lungs every 4 (four) hours as needed for wheezing or shortness of breath.   Azelastine-Fluticasone 137-50 MCG/ACT Susp Place 1 spray into the nose in the morning and at bedtime.   Breo Ellipta 100-25 MCG/INH Aepb Generic drug: fluticasone furoate-vilanterol Inhale 1 puff into the lungs daily. Rinse mouth after each use.   cetirizine 10 MG tablet Commonly known as: ZYRTEC Take 10 mg by mouth 2 (two) times daily.   COLACE PO Take 1 capsule by  mouth daily.   enalapril 10 MG tablet Commonly known as: Vasotec Take 1 tablet (10 mg total) by mouth daily.   EPINEPHrine 0.3 mg/0.3 mL Soaj injection Commonly known as: Auvi-Q Inject 0.3 mLs (0.3 mg total) into the muscle as needed for anaphylaxis.   famotidine 20 MG tablet Commonly known as: PEPCID Take 1 tablet (20 mg total) by mouth 2 (two) times daily.   insulin pump Soln Inject into the skin  continuous. Humalog 100 units/mL   Microlet Lancets Misc   montelukast 10 MG tablet Commonly known as: SINGULAIR Take 1 tablet (10 mg total) by mouth daily.   NON FORMULARY Continuous glucose monitor   prenatal multivitamin Tabs tablet Take 1 tablet by mouth daily at 12 noon.   psyllium 28 % packet Commonly known as: METAMUCIL SMOOTH TEXTURE Take 1 packet by mouth daily.        OBJECTIVE:   Vital Signs: BP 118/70   Pulse 94   Resp 18   Ht 5\' 2"  (1.575 m)   Wt 103 lb 9.6 oz (47 kg)   SpO2 95%   BMI 18.95 kg/m    Wt Readings from Last 3 Encounters:  06/06/20 109 lb 4 oz (49.6 kg)  04/28/20 112 lb 12 oz (51.1 kg)  04/26/20 112 lb 6.4 oz (51 kg)     Exam: General: Pt appears well and is in NAD  Lungs: Clear with good BS bilat with no rales, rhonchi, or wheezes  Heart: RRR with normal S1 and S2 and no gallops; no murmurs; no rub  Abdomen: Normoactive bowel sounds, soft, nontender, without masses or organomegaly palpable  Extremities: No pretibial edema.   Neuro: MS is good with appropriate affect, pt is alert and Ox3    DM foot exam: 04/26/2020  The skin of the feet is intact without sores or ulcerations. The pedal pulses are 2+ on right and 2+ on left. The sensation is intact to a screening 5.07, 10 gram monofilament bilaterally  DATA REVIEWED:  Lab Results  Component Value Date   HGBA1C 7.0 (H) 02/23/2020   HGBA1C 6.6 (H) 12/17/2019   HGBA1C 7.7 05/07/2019   Lab Results  Component Value Date   MICROALBUR 0.9 04/05/2020   CREATININE 0.70  06/06/2020   Lab Results  Component Value Date   MICRALBCREAT 1.3 04/05/2020    ASSESSMENT / PLAN / RECOMMENDATIONS:   1) Type 1 Diabetes Mellitus, Optimally controlled, Without complications - Most recent A1c of 7.0 %. Goal A1c < 7.0 %.    - I have manually reviewed her pump today and it seems her hypoglycemic episodes happen during the day following a bolus, this is despite entering less CHO than she actually takes. I have advised her to drink a glass of milk before each nursing session, as breast feeding increased insulin sensitivity. She believes this is too much but will try drinking half a cup of milk and proceed from there. If despite drinking milk, she continues to have hypoglycemia, she was advised to adjust her I:C  From 7.5 to 8 and from 10 to 11.     MEDICATIONS:  Humalog    Pump   MEDTRONIC   Settings   Insulin type   Humalog    Basal rate       0000-0400  0.200 u/h    0400-0700  0.350 u/h   0700-1200  0.675 u/h    1200-1800 0.600 u/h    1800-0000 0.450 u/hr          I:C ratio       0000-0700  1:13   0700-1100 7.5   1100-000 10          Sensitivity       0000  60      AIT     0000  4         EDUCATION / INSTRUCTIONS:  BG monitoring instructions: Patient is instructed to check her blood sugars 4 times  a day, before meals and bedtime   Call [place] [facility] clinic if: BG persistently < 70 . Marland Kitchen I reviewed the Rule of 15 for the treatment of hypoglycemia in detail with the patient. Literature supplied.   2) Diabetic complications:   Eye: Does not have known diabetic retinopathy.   Neuro/ Feet: Does not have known diabetic peripheral neuropathy .   Renal: Patient does not have known baseline CKD. She   is  on an ACEI/ARB at present.     F/U in 3  Months    Signed electronically by: Lyndle Herrlich, MD  [facility] Endocrinology  [facility] [address] [place] [place],  [place] [address] Phone: [phone] FAX: [phone]   CC: Darcella Gasman [address] [place] [address] Phone: [phone]  Fax: [phone]  Return to Endocrinology clinic as below: Future Appointments  Date Time Provider Department Center  07/28/2020 10:50 AM Mardee Clune, Konrad Dolores, MD [facility] None  10/04/2020 11:00 AM Ellamae Sia, DO [facility] None

## 2020-07-31 MED ORDER — GLUCAGON 1 MG/0.2ML ~~LOC~~ SOAJ: 1.0000 mg | SUBCUTANEOUS | 1 refills | Status: AC

## 2020-08-24 ENCOUNTER — Encounter: Payer: Self-pay | Admitting: Internal Medicine

## 2020-08-24 ENCOUNTER — Other Ambulatory Visit: Payer: Self-pay

## 2020-08-24 ENCOUNTER — Ambulatory Visit: Payer: Managed Care, Other (non HMO) | Admitting: Internal Medicine

## 2020-08-24 ENCOUNTER — Telehealth: Payer: Self-pay

## 2020-08-24 VITALS — BP 148/72 | HR 90 | Ht 62.0 in | Wt 105.6 lb

## 2020-08-24 DIAGNOSIS — M2560 Stiffness of unspecified joint, not elsewhere classified: Secondary | ICD-10-CM

## 2020-08-24 DIAGNOSIS — E063 Autoimmune thyroiditis: Secondary | ICD-10-CM | POA: Diagnosis not present

## 2020-08-24 DIAGNOSIS — E109 Type 1 diabetes mellitus without complications: Secondary | ICD-10-CM

## 2020-08-24 LAB — CBC WITH DIFFERENTIAL/PLATELET
Basophils Absolute: 0 10*3/uL (ref 0.0–0.1)
Basophils Relative: 0.9 % (ref 0.0–3.0)
Eosinophils Absolute: 0.1 10*3/uL (ref 0.0–0.7)
Eosinophils Relative: 1 % (ref 0.0–5.0)
HCT: 39.7 % (ref 36.0–46.0)
Hemoglobin: 13.2 g/dL (ref 12.0–15.0)
Lymphocytes Relative: 25.9 % (ref 12.0–46.0)
Lymphs Abs: 1.3 10*3/uL (ref 0.7–4.0)
MCHC: 33.3 g/dL (ref 30.0–36.0)
MCV: 90.6 fl (ref 78.0–100.0)
Monocytes Absolute: 0.4 10*3/uL (ref 0.1–1.0)
Monocytes Relative: 7.1 % (ref 3.0–12.0)
Neutro Abs: 3.2 10*3/uL (ref 1.4–7.7)
Neutrophils Relative %: 65.1 % (ref 43.0–77.0)
Platelets: 365 10*3/uL (ref 150.0–400.0)
RBC: 4.38 Mil/uL (ref 3.87–5.11)
RDW: 14.5 % (ref 11.5–15.5)
WBC: 5 10*3/uL (ref 4.0–10.5)

## 2020-08-24 LAB — COMPREHENSIVE METABOLIC PANEL
ALT: 22 U/L (ref 0–35)
AST: 20 U/L (ref 0–37)
Albumin: 4.5 g/dL (ref 3.5–5.2)
Alkaline Phosphatase: 99 U/L (ref 39–117)
BUN: 17 mg/dL (ref 6–23)
CO2: 29 mEq/L (ref 19–32)
Calcium: 10 mg/dL (ref 8.4–10.5)
Chloride: 100 mEq/L (ref 96–112)
Creatinine, Ser: 0.78 mg/dL (ref 0.40–1.20)
GFR: 102.12 mL/min (ref >60.00)
Glucose, Bld: 275 mg/dL — ABNORMAL HIGH (ref 70–99)
Potassium: 4 mEq/L (ref 3.5–5.1)
Sodium: 136 mEq/L (ref 135–145)
Total Bilirubin: 0.7 mg/dL (ref 0.2–1.2)
Total Protein: 8.1 g/dL (ref 6.0–8.3)

## 2020-08-24 LAB — TSH: TSH: 0.73 u[IU]/mL (ref 0.35–4.50)

## 2020-08-24 LAB — T4, FREE: Free T4: 0.82 ng/dL (ref 0.60–1.60)

## 2020-08-24 LAB — VITAMIN D 25 HYDROXY (VIT D DEFICIENCY, FRACTURES): VITD: 36.94 ng/mL (ref 30.00–100.00)

## 2020-08-24 NOTE — Patient Instructions (Signed)
-   Please switch pump back to auto mode - Change pump site every 3rd day

## 2020-08-24 NOTE — Telephone Encounter (Signed)
Patient has been scheduled to come in tomorrow. Nothing further needed  Client [facility] Day - Client Client Site [facility] [place] - Day Physician AA - PHYSICIAN, Crissie Figures- MD Contact Type Call Who Is Calling Patient / Member / Family / Caregiver Caller Name Bonnie Overdorf Caller Phone Number [phone] Patient Name Nicole Carrillo Patient DOB Sep 29, 1986 Call Type Message Only Information Provided Reason for Call Request to Schedule Office Appointment Initial Comment The caller wants to know if she can get a flu shot at this location. Disp. Time Disposition Final User 08/23/2020 8:31:02 AM General Information Provided Yes Majel Homer Call Closed By: Majel Homer Transaction Date/Time: 08/23/2020 8:07:48 AM (ET)

## 2020-08-24 NOTE — Progress Notes (Signed)
Name: Nicole Carrillo  Age/ Sex: 34 y.o., female   MRN/ DOB: [id number], 1986-03-18     PCP: Natalia Leatherwood, DO   Reason for Endocrinology Evaluation: Type 1 Diabetes Mellitus  Initial Endocrine Consultative Visit: 8/15/221    PATIENT IDENTIFIER: [name] is a 34 y.o. female with a past medical history of T1DM and Hashimoto's Thyroiditis. The patient has followed with Endocrinology clinic since 04/26/2020 for consultative assistance with management of her diabetes.  DIABETIC HISTORY:  Nicole Carrillo was initially diagnosed with T2DM at age 85 but this diagnosis was subsequently changed to T1DM after hospitalization for hyperglycemia and HHS. It is unclear to me the circumstances of her diagnosis. She has been on insulin pumps for  Years. Her hemoglobin A1c has ranged from 6.6% in 2021, peaking at 7.7% in 2020.   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 We adjusted pump settings  Today (08/24/2020): Ms. Uriegas is here for a follow up on diabetes management.   She checks her blood sugars multiple  times daily through the [employer] CGM. The patient has not had hypoglycemic episodes since the last clinic visit, as she has been drinking the milk before nursing.     Today she is c/o of waking up with painful stiffness in hands and feet . She continues to loose weight and is concerned about her thyroid.  She denies any local neck symptoms  Denies diarrhea  Sleeps well at night unless baby wakes her up    HOME DIABETES REGIMEN:  HUmalog     Pump   MEDTRONIC   Settings   Insulin type   Humalog    Basal rate       0000-0400  0.200 u/h    0400-0700  0.350 u/h   0700-1200  0.675 u/h    1200-1800 0.600 u/h    1800-0000 0.450 u/hr          I:C ratio       0000-0700  1:13   0700-1100 7.5   1100-000 10           Sensitivity       0000  60      AIT     0000  4      Type & Model of Pump: Medtronic 770 Insulin Type: Currently using Humalog  PUMP ADHERENCE: Bolus Wizard - using most of the time - with food and correction  Bolus Wizard Override- used  - no manual boluses  Infusion Set Changes- q 7 days  Cannula Fill / amount - yes    CURRENT PUMP STATISTICS: Auto mode = 0% Manual mode = 100% Sensor wear = 91% Average BG: 164 +/- 56 Average SG: 152 +/- 53 Estimated A1C: 6.9 BG checks / Calibrations/day: 2.8 Average Daily Carbs (g): 83+/- 21 Average Total Daily Insulin:  19.1units Average Daily Bolus: 10.1 units (53%) Average Daily Basal:  9.0 units (47%) Time in Range: High (>250) = 5% Above target (180-250) =23 % In Range (70-180) = 71% Below Target (<70 but >50) = 1% Low (<50) =0 %      Statin: no ACE-I/ARB: yes     DIABETIC COMPLICATIONS: Microvascular complications:   Psoterior retinal detachment and bleed that is attributed to HTN  Denies: CKD, neuropathy  Last Eye Exam: Completed 01/2020  Macrovascular complications:  Denies: CAD, CVA, PVD   HISTORY:  Past Medical History:  Past Medical History:  Diagnosis Date  . Allergy   . Angio-edema   . Anxiety   . Asthma   . Chicken pox   . Constipation   . Diabetes mellitus type 1 (HCC)   . Dysphagia   . Dysthymia   . Eczema   . Elevated platelet count   . Fetal CPAM   . Folliculitis   . GERD (gastroesophageal reflux disease)   . Hashimoto's thyroiditis   . History of migraine   . Hyperlipidemia   . Insulin pump in place   . Left breast mass 01/14/2017   fibroadenoma- benign  . OCD (obsessive compulsive disorder)   . Orthodontics    permanent upper and lower retainers  . Preeclampsia 02/24/2020  . PVD (posterior vitreous detachment), right   . Rectal bleeding   . Rectal fissure   . Tinnitus   . Urticaria   . Wears contact lenses    Past Surgical History:   Past Surgical History:  Procedure Laterality Date  . DENTAL SURGERY    . ENDOSCOPIC CONCHA BULLOSA RESECTION Bilateral 07/24/2018   Procedure: ENDOSCOPIC CONCHA BULLOSA MIDDLE TURBINATE;  Surgeon: Vernie Murders, MD;  Location: [facility];  Service: ENT;  Laterality: Bilateral;  Diabetic - insulin pump  . SEPTOPLASTY N/A 07/24/2018   Procedure: SEPTOPLASTY;  Surgeon: Vernie Murders, MD;  Location: [facility];  Service: ENT;  Laterality: N/A;  NEEDS TO BE FIRST INSULIN PUMP  . TURBINATE REDUCTION Bilateral 07/24/2018   Procedure: INFERIOR TURBINATE REDUCTION;  Surgeon: Vernie Murders, MD;  [facility];  Service: ENT;  Laterality: Bilateral;  . WISDOM TOOTH EXTRACTION      Social History:  reports that she has never smoked. She has never used smokeless tobacco. She reports previous alcohol use of about 1.0 standard drink of alcohol per week. She reports previous drug use. Family History:  Family History  Problem Relation Age of Onset  . Arthritis Mother   . Stroke Mother   . Miscarriages / [place] Mother   . Arthritis Father   . Dementia Maternal Grandmother   . Emphysema Maternal Grandfather      HOME MEDICATIONS: Allergies as of 08/24/2020      Reactions   Shellfish Allergy Shortness Of Breath, Rash   Crab causes SOB, Scallops cause rash.  Can eat Shrimp and lobster.   Acetaminophen Other (See Comments)   Effects blood sugars and insulin pump.   Cephalexin Nausea Only, Other (See Comments)   Headaches, dizziness   Pneumococcal Vaccines Swelling   Local injection swelling   Prednisone Nausea Only, Other (See Comments)   Headaches, dizziness, Prefers not to take because of uncontrollable BG's on this med;    Soy Allergy Other (See Comments)   Blood test   Adhesive [tape] Rash   With extended exposure   Peanut-containing Drug Products Rash      Medication List       Accurate as of August 24, 2020  2:53 PM. If you have any questions,  ask your nurse or doctor.        albuterol 108 (90 Base) MCG/ACT inhaler Commonly known as: VENTOLIN HFA Inhale 2 puffs into the lungs every 4 (four) hours as needed for wheezing or shortness of breath.   Azelastine-Fluticasone 137-50 MCG/ACT Susp Place 1 spray into the nose in the morning and at bedtime.   Breo Ellipta 100-25 MCG/INH Aepb Generic drug: fluticasone furoate-vilanterol  Inhale 1 puff into the lungs daily. Rinse mouth after each use.   cetirizine 10 MG tablet Commonly known as: ZYRTEC Take 10 mg by mouth 2 (two) times daily.   COLACE PO Take 1 capsule by mouth daily.   enalapril 10 MG tablet Commonly known as: Vasotec Take 1 tablet (10 mg total) by mouth daily.   EPINEPHrine 0.3 mg/0.3 mL Soaj injection Commonly known as: Auvi-Q Inject 0.3 mLs (0.3 mg total) into the muscle as needed for anaphylaxis.   famotidine 20 MG tablet Commonly known as: PEPCID Take 1 tablet (20 mg total) by mouth 2 (two) times daily.   Glucagon 1 MG/0.2ML Soaj Inject 1 mg into the skin as directed.   insulin pump Soln Inject into the skin continuous. Humalog 100 units/mL   Microlet Lancets Misc   montelukast 10 MG tablet Commonly known as: SINGULAIR Take 1 tablet (10 mg total) by mouth daily.   NON FORMULARY Continuous glucose monitor   prenatal multivitamin Tabs tablet Take 1 tablet by mouth daily at 12 noon.   psyllium 28 % packet Commonly known as: METAMUCIL SMOOTH TEXTURE Take 1 packet by mouth daily.        OBJECTIVE:   Vital Signs: BP (!) 148/72 (BP Location: Right Arm, Patient Position: Sitting, Cuff Size: Normal)   Pulse 90   Ht 5\' 2"  (1.575 m)   Wt 105 lb 9.6 oz (47.9 kg)   SpO2 96%   BMI 19.31 kg/m    Wt Readings from Last 3 Encounters:  08/24/20 105 lb 9.6 oz (47.9 kg)  07/28/20 103 lb 9.6 oz (47 kg)  06/06/20 109 lb 4 oz (49.6 kg)     Exam: General: Pt appears well and is in NAD  Lungs: Clear with good BS bilat with no rales, rhonchi, or  wheezes  Heart: RRR with normal S1 and S2 and no gallops; no murmurs; no rub  Abdomen: Normoactive bowel sounds, soft, nontender, without masses or organomegaly palpable  Extremities: No pretibial edema.   Neuro: MS is good with appropriate affect, pt is alert and Ox3    DM foot exam: 04/26/2020  The skin of the feet is intact without sores or ulcerations. The pedal pulses are 2+ on right and 2+ on left. The sensation is intact to a screening 5.07, 10 gram monofilament bilaterally  DATA REVIEWED:  Lab Results  Component Value Date   HGBA1C 7.0 (A) 07/28/2020   HGBA1C 7.0 (H) 02/23/2020   HGBA1C 6.6 (H) 12/17/2019   Lab Results  Component Value Date   MICROALBUR 0.9 04/05/2020   CREATININE 0.70 06/06/2020   Lab Results  Component Value Date   MICRALBCREAT 1.3 04/05/2020   Results for Brandon MelnickBOTTORFF, Jovani (MRN [id number]) as of 08/25/2020 08:20  Ref. Range 08/24/2020 13:58  Sodium Latest Ref Range: 135 - 145 mEq/L 136  Potassium Latest Ref Range: 3.5 - 5.1 mEq/L 4.0  Chloride Latest Ref Range: 96 - 112 mEq/L 100  CO2 Latest Ref Range: 19 - 32 mEq/L 29  Glucose Latest Ref Range: 70 - 99 mg/dL 629275 (H)  BUN Latest Ref Range: 6 - 23 mg/dL 17  Creatinine Latest Ref Range: 0.40 - 1.20 mg/dL 5.280.78  Calcium Latest Ref Range: 8.4 - 10.5 mg/dL 41.310.0  Alkaline Phosphatase Latest Ref Range: 39 - 117 U/L 99  Albumin Latest Ref Range: 3.5 - 5.2 g/dL 4.5  AST Latest Ref Range: 0 - 37 U/L 20  ALT Latest Ref Range: 0 - 35 U/L 22  Total  Protein Latest Ref Range: 6.0 - 8.3 g/dL 8.1  Total Bilirubin Latest Ref Range: 0.2 - 1.2 mg/dL 0.7  GFR Latest Ref Range: >60.00 mL/min 102.12  VITD Latest Ref Range: 30.00 - 100.00 ng/mL 36.94  WBC Latest Ref Range: 4.0 - 10.5 K/uL 5.0  RBC Latest Ref Range: 3.87 - 5.11 Mil/uL 4.38  Hemoglobin Latest Ref Range: 12.0 - 15.0 g/dL 31.5  HCT Latest Ref Range: 36 - 46 % 39.7  MCV Latest Ref Range: 78.0 - 100.0 fl 90.6  MCHC Latest Ref Range: 30.0 - 36.0 g/dL 94.5   RDW Latest Ref Range: 11.5 - 15.5 % 14.5  Platelets Latest Ref Range: 150 - 400 K/uL 365.0  Neutrophils Latest Ref Range: 43 - 77 % 65.1  Lymphocytes Latest Ref Range: 12 - 46 % 25.9  Monocytes Relative Latest Ref Range: 3 - 12 % 7.1  Eosinophil Latest Ref Range: 0 - 5 % 1.0  Basophil Latest Ref Range: 0 - 3 % 0.9  NEUT# Latest Ref Range: 1.4 - 7.7 K/uL 3.2  Lymphocyte # Latest Ref Range: 0.7 - 4.0 K/uL 1.3  Monocyte # Latest Ref Range: 0 - 1 K/uL 0.4  Eosinophils Absolute Latest Ref Range: 0 - 0 K/uL 0.1  Basophils Absolute Latest Ref Range: 0 - 0 K/uL 0.0  TSH Latest Ref Range: 0.35 - 4.50 uIU/mL 0.73  Triiodothyronine (T3) Latest Ref Range: 76 - 181 ng/dL 80  O5,FYTW(KMQKMM) Latest Ref Range: 0.60 - 1.60 ng/dL 3.81   ASSESSMENT / PLAN / RECOMMENDATIONS:   1) Type 1 Diabetes Mellitus, Optimally controlled, Without complications - Most recent A1c of 7.0 %. Goal A1c < 7.0 %.    - I have encouraged the pt to go back to auto mode - I also have advised her to change pump set every 3rd day rather then 5-7 days  - No other changes at this time   MEDICATIONS:  Humalog    Pump   MEDTRONIC   Settings   Insulin type   Humalog    Basal rate       0000-0400  0.200 u/h    0400-0700  0.350 u/h   0700-1200  0.675 u/h    1200-1800 0.600 u/h    1800-0000 0.450 u/hr          I:C ratio       0000-0700  1:13   0700-1100 7.5   1100-000 10          Sensitivity       0000  60      AIT     0000  4         EDUCATION / INSTRUCTIONS:  BG monitoring instructions: Patient is instructed to check her blood sugars 4 times a day, before meals and bedtime   Call [place] [facility] clinic if: BG persistently < 70 . Marland Kitchen I reviewed the Rule of 15 for the treatment of hypoglycemia in detail with the patient. Literature supplied.   2) Diabetic complications:   Eye: Does not have known diabetic retinopathy.   Neuro/ Feet: Does not have known diabetic  peripheral neuropathy .   Renal: Patient does not have known baseline CKD. She   is  on an ACEI/ARB at present.   3) Hashimoto's Disease:  - She is concerned about her thyroid due to weight loss and arthralgias  and would like that checked today  - No local neck symptoms  - TFT's are normal    4) Joint stiffness:  -  She has noted stiffness , she is concerned that her calcium may be elevated as this was noted after she started drinking milk per my advised to prevent hypoglycemia during nursing . Pt is also concerned about RA due to her personal hx of autoimmune disease  - Vitamin D is normal   F/U in 3  Months    Signed electronically by: Lyndle Herrlich, MD  [facility] Endocrinology  [facility] [address] [place], [place] [address] Phone: [phone] FAX: [phone]   CC: Natalia Leatherwood, DO [address] [place] [address] Phone: [phone]  Fax: [phone]  Return to Endocrinology clinic as below: Future Appointments  Date Time Provider Department Center  08/25/2020 10:45 AM [facility] NURSE [facility]  09/12/2020  9:40 AM Tobb, Kardie, DO [facility] None  10/04/2020 11:00 AM Ellamae Sia, DO [facility] None  11/25/2020 11:10 AM Norlene Lanes, Konrad Dolores, MD [facility] None

## 2020-08-25 ENCOUNTER — Ambulatory Visit (INDEPENDENT_AMBULATORY_CARE_PROVIDER_SITE_OTHER): Payer: Managed Care, Other (non HMO)

## 2020-08-25 DIAGNOSIS — Z23 Encounter for immunization: Secondary | ICD-10-CM | POA: Diagnosis not present

## 2020-08-25 LAB — T3: T3, Total: 80 ng/dL (ref 76–181)

## 2020-09-12 ENCOUNTER — Ambulatory Visit (INDEPENDENT_AMBULATORY_CARE_PROVIDER_SITE_OTHER): Payer: Managed Care, Other (non HMO)

## 2020-09-12 ENCOUNTER — Other Ambulatory Visit: Payer: Self-pay

## 2020-09-12 ENCOUNTER — Encounter: Payer: Self-pay | Admitting: Cardiology

## 2020-09-12 ENCOUNTER — Ambulatory Visit (INDEPENDENT_AMBULATORY_CARE_PROVIDER_SITE_OTHER): Payer: Managed Care, Other (non HMO) | Admitting: Cardiology

## 2020-09-12 VITALS — BP 118/70 | HR 93 | Ht 62.0 in | Wt 104.0 lb

## 2020-09-12 DIAGNOSIS — E109 Type 1 diabetes mellitus without complications: Secondary | ICD-10-CM

## 2020-09-12 DIAGNOSIS — R079 Chest pain, unspecified: Secondary | ICD-10-CM

## 2020-09-12 DIAGNOSIS — R002 Palpitations: Secondary | ICD-10-CM | POA: Diagnosis not present

## 2020-09-12 DIAGNOSIS — F32A Depression, unspecified: Secondary | ICD-10-CM | POA: Diagnosis not present

## 2020-09-12 NOTE — Progress Notes (Signed)
Cardiology Office Note:    Date:  09/12/2020   ID:  Nicole Carrillo, DOB December 13, 1985, MRN [id number]  PCP:  Natalia Leatherwood, DO  Cardiologist:  Thomasene Ripple, DO  Electrophysiologist:  None   Referring MD: Natalia Leatherwood, DO   Chief Complaint  Patient presents with  . Palpitations  . Chest Pain    History of Present Illness:    Nicole Carrillo is a 34 y.o. female with a hx of asthma, preeclampsia, postpartum hypertension, hyperlipidemia, Hashimoto's thyroiditis and type 1 diabetes diagnosed at 34 years old presents today chest pain palpitations.  Patient tells me that her problems started about 4 years ago when she experienced her initial chest pain.  She states that at that time she was evaluated by pulmonary she was diagnosed with asthma she had PFT which reported her diagnosis.  In addition she did see a cardiologist she had a stress test done which is treadmill exercise stress test as well as her HDL and LDL were all normal.  She did continue to be treated for asthma.    Of recent she notes that she has had intermittent chest pressure-like sensation.  She states it is intermittent throughout the day and last for few minutes or so and then resolve.  Does not matter what she is doing she could be walking around reading a book or just lay in the chair when she experiences this.  It messes up her old mood because sometimes it makes her not interested in doing anything at home.  In addition she has had some left-sided sharp chest pain which she experiences once on exertion.  What is often recently is the fact that she is having intermittent palpitations and has had abrupt onset of fast heartbeat which lasts for few minutes and then resolves.  She is concerned.  The patient is really concerned about the symptoms that is affecting her daily activity.  She was in tears as we talked about this.  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  She is a new mom with a son  who is 6 months.  She had postpartum hypertension and chest recently titrated herself off enalapril and has been watching her blood pressure at home.   Past Medical History:  Diagnosis Date  . Allergy   . Angio-edema   . Anxiety   . Asthma   . Chicken pox   . Constipation   . Diabetes mellitus type 1 (HCC)   . Dysphagia   . Dysthymia   . Eczema   . Elevated platelet count   . Fetal CPAM   . Folliculitis   . GERD (gastroesophageal reflux disease)   . Hashimoto's thyroiditis   . History of migraine   . Hyperlipidemia   . Insulin pump in place   . Left breast mass 01/14/2017   fibroadenoma- benign  . OCD (obsessive compulsive disorder)   . Orthodontics    permanent upper and lower retainers  . Preeclampsia 02/24/2020  . PVD (posterior vitreous detachment), right   . Rectal bleeding   . Rectal fissure   . Tinnitus   . Urticaria   . Wears contact lenses     Past Surgical History:  Procedure Laterality Date  . DENTAL SURGERY    . ENDOSCOPIC CONCHA BULLOSA RESECTION Bilateral 07/24/2018   Procedure: ENDOSCOPIC CONCHA BULLOSA MIDDLE TURBINATE;  Surgeon: Vernie Murders, MD;  Location: [facility];  Service: ENT;  Laterality: Bilateral;  Diabetic - insulin  pump  . SEPTOPLASTY N/A 07/24/2018   Procedure: SEPTOPLASTY;  Surgeon: Vernie Murders, MD;  Location: [facility];  Service: ENT;  Laterality: N/A;  NEEDS TO BE FIRST INSULIN PUMP  . TURBINATE REDUCTION Bilateral 07/24/2018   Procedure: INFERIOR TURBINATE REDUCTION;  Surgeon: Vernie Murders, MD;  Location: [facility];  Service: ENT;  Laterality: Bilateral;  . WISDOM TOOTH EXTRACTION      Current Medications: Current Meds  Medication Sig  . albuterol (VENTOLIN HFA) 108 (90 Base) MCG/ACT inhaler Inhale 2 puffs into the lungs every 4 (four) hours as needed for wheezing or shortness of breath.  . cetirizine (ZYRTEC) 10 MG tablet Take 10 mg by mouth 2 (two) times daily.   Marland Kitchen EPINEPHrine (AUVI-Q) 0.3 mg/0.3 mL  IJ SOAJ injection Inject 0.3 mLs (0.3 mg total) into the muscle as needed for anaphylaxis.  . fluticasone furoate-vilanterol (BREO ELLIPTA) 100-25 MCG/INH AEPB Inhale 1 puff into the lungs daily. Rinse mouth after each use.  Marland Kitchen Glucagon 1 MG/0.2ML SOAJ Inject 1 mg into the skin as directed.  . Insulin Human (INSULIN PUMP) SOLN Inject into the skin continuous. Humalog 100 units/mL  . Microlet Lancets MISC   . montelukast (SINGULAIR) 10 MG tablet Take 1 tablet (10 mg total) by mouth daily.  . NON FORMULARY Continuous glucose monitor  . Prenatal Vit-Fe Fumarate-FA (PRENATAL MULTIVITAMIN) TABS tablet Take 1 tablet by mouth daily at 12 noon.     Allergies:   Shellfish allergy, Acetaminophen, Cephalexin, Pneumococcal vaccines, Prednisone, Soy allergy, Adhesive [tape], and Peanut-containing drug products   Social History   Socioeconomic History  . Marital status: Married    Spouse name: Not on file  . Number of children: Not on file  . Years of education: Not on file  . Highest education level: Not on file  Occupational History  . Not on file  Tobacco Use  . Smoking status: Never Smoker  . Smokeless tobacco: Never Used  Vaping Use  . Vaping Use: Never used  Substance and Sexual Activity  . Alcohol use: Not Currently    Alcohol/week: 1.0 standard drink    Types: 1 Glasses of wine per week  . Drug use: Not Currently  . Sexual activity: Yes    Partners: Male    Birth control/protection: None  Other Topics Concern  . Not on file  Social History Narrative   Marital status/children/pets: Married. Soon to have 1 son.    Education/employment: MSN, Nurse.    Safety:      -smoke alarm in the home:Yes     - wears seatbelt: Yes     - Feels safe in their relationships: Yes   Social Determinants of Health   Financial Resource Strain:   . Difficulty of Paying Living Expenses: Not on file  Food Insecurity: No Food Insecurity  . Worried About Programme researcher, broadcasting/film/video in the Last Year: Never true    . Ran Out of Food in the Last Year: Never true  Transportation Needs: No Transportation Needs  . Lack of Transportation (Medical): No  . Lack of Transportation (Non-Medical): No  Physical Activity:   . Days of Exercise per Week: Not on file  . Minutes of Exercise per Session: Not on file  Stress:   . Feeling of Stress : Not on file  Social Connections:   . Frequency of Communication with Friends and Family: Not on file  . Frequency of Social Gatherings with Friends and Family: Not on file  . Attends Religious  Services: Not on file  . Active Member of Clubs or Organizations: Not on file  . Attends Banker Meetings: Not on file  . Marital Status: Not on file     Family History: The patient's family history includes Arthritis in her father and mother; Dementia in her maternal grandmother; Emphysema in her maternal grandfather; Miscarriages / [place] in her mother; Stroke in her mother.  ROS:   Review of Systems  Constitution: Negative for decreased appetite, fever and weight gain.  HENT: Negative for congestion, ear discharge, hoarse voice and sore throat.   Eyes: Negative for discharge, redness, vision loss in right eye and visual halos.  Cardiovascular: Reports chest pain and palpitations.  Negative for dyspnea on exertion, leg swelling, orthopnea and palpitations.  Respiratory: Negative for cough, hemoptysis, shortness of breath and snoring.   Endocrine: Negative for heat intolerance and polyphagia.  Hematologic/Lymphatic: Negative for bleeding problem. Does not bruise/bleed easily.  Skin: Negative for flushing, nail changes, rash and suspicious lesions.  Musculoskeletal: Negative for arthritis, joint pain, muscle cramps, myalgias, neck pain and stiffness.  Gastrointestinal: Negative for abdominal pain, bowel incontinence, diarrhea and excessive appetite.  Genitourinary: Negative for decreased libido, genital sores and incomplete emptying.  Neurological: Negative  for brief paralysis, focal weakness, headaches and loss of balance.  Psychiatric/Behavioral: Negative for altered mental status, depression and suicidal ideas.  Allergic/Immunologic: Negative for HIV exposure and persistent infections.    EKGs/Labs/Other Studies Reviewed:    The following studies were reviewed today:   EKG:  The ekg ordered today demonstrates sinus rhythm, heart rate 93 bpm   recent Labs: 08/24/2020: ALT 22; BUN 17; Creatinine, Ser 0.78; Hemoglobin 13.2; Platelets 365.0; Potassium 4.0; Sodium 136; TSH 0.73  Recent Lipid Panel No results found for: CHOL, TRIG, HDL, CHOLHDL, VLDL, LDLCALC, LDLDIRECT  Physical Exam:    VS:  BP 118/70 (BP Location: Left Arm, Patient Position: Sitting, Cuff Size: Normal)   Pulse 93   Ht 5\' 2"  (1.575 m)   Wt 104 lb (47.2 kg)   SpO2 98%   BMI 19.02 kg/m     Wt Readings from Last 3 Encounters:  09/12/20 104 lb (47.2 kg)  08/24/20 105 lb 9.6 oz (47.9 kg)  07/28/20 103 lb 9.6 oz (47 kg)     GEN: Well nourished, well developed in no acute distress HEENT: Normal NECK: No JVD; No carotid bruits LYMPHATICS: No lymphadenopathy CARDIAC: S1S2 noted,RRR, no murmurs, rubs, gallops RESPIRATORY:  Clear to auscultation without rales, wheezing or rhonchi  ABDOMEN: Soft, non-tender, non-distended, +bowel sounds, no guarding. EXTREMITIES: No edema, No cyanosis, no clubbing MUSCULOSKELETAL:  No deformity  SKIN: Warm and dry NEUROLOGIC:  Alert and oriented x 3, non-focal PSYCHIATRIC:  Normal affect, good insight  ASSESSMENT:    1. Chest pain of uncertain etiology   2. Type 1 diabetes mellitus without complication (HCC)   3. Depression, unspecified depression type   4. Palpitations    PLAN:     1.  Her chest pain is concerning for this patient would like 07/30/20 to pursue an ischemic evaluation with stress echocardiogram.  I talked to the patient about this she is agreeable to proceed with this testing.  2.  I would like to rule out a  cardiovascular etiology of this palpitation, therefore at this time I would like to placed a zio patch for 14 days.   For now, I do reccomend that the patient goes to the nearest ED if  symptoms recur.  3.  I  also suspect that the patient also may have some factor of postpartum depression.  I will talk with her about this.  And I have recommended that she see clinical psychology to help with some psychotherapy which I think will be very helpful in this patient.  She is agreeable and if consult has been placed.  4.  diabetes is being managed by her primary care physician.  5.  Preeclampsia M/postpartum depression blood pressure deceptively in the office.  She tells me she takes her blood pressure daily.  Her goal is to stay less than 130/80 mmHg.  The patient is in agreement with the above plan. The patient left the office in stable condition.  The patient will follow up in 3 months or sooner if needed.   Medication Adjustments/Labs and Tests Ordered: Current medicines are reviewed at length with the patient today.  Concerns regarding medicines are outlined above.  Orders Placed This Encounter  Procedures  . Ambulatory referral to Psychiatry  . LONG TERM MONITOR (3-14 DAYS)  . EKG 12-Lead  . ECHOCARDIOGRAM STRESS TEST   No orders of the defined types were placed in this encounter.   Patient Instructions  Medication Instructions:  No medication changes. *If you need a refill on your cardiac medications before your next appointment, please call your pharmacy*   Lab Work: None ordered If you have labs (blood work) drawn today and your tests are completely normal, you will receive your results only by: Marland Kitchen MyChart Message (if you have MyChart) OR . A paper copy in the mail If you have any lab test that is abnormal or we need to change your treatment, we will call you to review the results.   Testing/Procedures: Your physician has requested that you have an echocardiogram.  Echocardiography is a painless test that uses sound waves to create images of your heart. It provides your doctor with information about the size and shape of your heart and how well your heart's chambers and valves are working. This procedure takes approximately one hour. There are no restrictions for this procedure.    WHY IS MY DOCTOR PRESCRIBING ZIO? The Zio system is proven and trusted by physicians to detect and diagnose irregular heart rhythms -- and has been prescribed to hundreds of thousands of patients.  The FDA has cleared the Zio system to monitor for many different kinds of irregular heart rhythms. In a study, physicians were able to reach a diagnosis 90% of the time with the Zio system1.  You can wear the Zio monitor -- a small, discreet, comfortable patch -- during your normal day-to-day activity, including while you sleep, shower, and exercise, while it records every single heartbeat for analysis.  1Barrett, P., et al. Comparison of 24 Hour Holter Monitoring Versus 14 Day Novel Adhesive Patch Electrocardiographic Monitoring. American Journal of Medicine, 2014.  ZIO VS. HOLTER MONITORING The Zio monitor can be comfortably worn for up to 14 days. Holter monitors can be worn for 24 to 48 hours, limiting the time to record any irregular heart rhythms you may have. Zio is able to capture data for the 51% of patients who have their first symptom-triggered arrhythmia after 48 hours.1  LIVE WITHOUT RESTRICTIONS The Zio ambulatory cardiac monitor is a small, unobtrusive, and water-resistant patch--you might even forget you're wearing it. The Zio monitor records and stores every beat of your heart, whether you're sleeping, working out, or showering.  Wear the monitor for 7 days, remove 09/19/2020.  Your physician has requested that  you have a stress echocardiogram. For further information please visit [email or web address]. Please follow instruction sheet as given.    Follow-Up: At  [facility], you and your health needs are our priority.  As part of our continuing mission to provide you with exceptional heart care, we have created designated Provider Care Teams.  These Care Teams include your primary Cardiologist (physician) and Advanced Practice Providers (APPs -  Physician Assistants and Nurse Practitioners) who all work together to provide you with the care you need, when you need it.  We recommend signing up for the patient portal called "MyChart".  Sign up information is provided on this After Visit Summary.  MyChart is used to connect with patients for Virtual Visits (Telemedicine).  Patients are able to view lab/test results, encounter notes, upcoming appointments, etc.  Non-urgent messages can be sent to your provider as well.   To learn more about what you can do with MyChart, go to [email or web address].    Your next appointment:   3 month(s)  The format for your next appointment:   In Person  Provider:   Thomasene RippleKardie Kazuto Sevey, DO   Other Instructions  Exercise Stress Test An exercise stress test is a test to check how your heart works during exercise. You will need to walk on a treadmill or ride an exercise bike for this test. An electrocardiogram (ECG) will record your heartbeat when you are at rest and when you are exercising. You may have an ultrasound or nuclear test after the exercise test. The test is done to check for coronary artery disease (CAD). It is also done to:  See how well you can exercise.  Watch for high blood pressure during exercise.  Test how well you can exercise after treatment.  Check the blood flow to your arms and legs. If your test result is not normal, more testing may be needed. What happens before the procedure?  Follow instructions from your doctor about what you cannot eat or drink. ? Do not have any drinks or foods that have caffeine in them for 24 hours before the test, or as told by your doctor. This includes coffee, tea  (even decaf tea), sodas, chocolate, and cocoa.  Ask your doctor about changing or stopping your normal medicines. This is important if you: ? Take diabetes medicines. ? Take beta-blocker medicines. ? Wear a nitroglycerin patch.  If you use an inhaler, bring it with you to the test.  Do not put lotions, powders, creams, or oils on your chest before the test.  Wear comfortable shoes and clothing.  Do not use any products that have nicotine or tobacco in them, such as cigarettes and e-cigarettes. Stop using them at least 4 hours before the test. If you need help quitting, ask your doctor. What happens during the procedure?   Patches (electrodes) will be put on your chest.  Wires will be connected to the patches. The wires will send signals to a machine to record your heartbeat.  Your heart rate will be watched while you are resting and while you are exercising. Your blood pressure will also be watched during the test.  You will walk on a treadmill or use a stationary bike. If you cannot use these, you may be asked to turn a crank with your hands.  The activity will get harder and will raise your heart rate.  You may be asked to breathe into a tube a few times during the test. This measures the gases that you breathe  out.  You will be asked how you are feeling throughout the test.  You will exercise until your heart reaches a target heart rate. You will stop early if: ? You feel dizzy. ? You have chest pain. ? You are out of breath. ? Your blood pressure is too high or too low. ? You have an irregular heartbeat. ? You have pain or aching in your arms or legs. The procedure may vary among doctors and hospitals. What happens after the procedure?  Your blood pressure, heart rate, breathing rate, and blood oxygen level will be watched after the test.  You may return to your normal diet and activities as told by your doctor.  It is up to you to get the results of your test. Ask  your doctor, or the department that is doing the test, when your results will be ready. Summary  An exercise stress test is a test to check how your heart works during exercise.  This test is done to check for coronary artery disease.  Your heart rate will be watched while you are resting and while you are exercising.  Follow instructions from your doctor about what you cannot eat or drink before the test. This information is not intended to replace advice given to you by your health care provider. Make sure you discuss any questions you have with your health care provider. Document Revised: 03/10/2019 Document Reviewed: 02/26/2017 Elsevier Patient Education  2020 [employer].      Adopting a Healthy Lifestyle.  Know what a healthy weight is for you (roughly BMI <25) and aim to maintain this   Aim for 7+ servings of fruits and vegetables daily   65-80+ fluid ounces of water or unsweet tea for healthy kidneys   Limit to max 1 drink of alcohol per day; avoid smoking/tobacco   Limit animal fats in diet for cholesterol and heart health - choose grass fed whenever available   Avoid highly processed foods, and foods high in saturated/trans fats   Aim for low stress - take time to unwind and care for your mental health   Aim for 150 min of moderate intensity exercise weekly for heart health, and weights twice weekly for bone health   Aim for 7-9 hours of sleep daily   When it comes to diets, agreement about the perfect plan isnt easy to find, even among the experts. Experts at the [facility] of [employer] developed an idea known as the Healthy Eating Plate. Just imagine a plate divided into logical, healthy portions.   The emphasis is on diet quality:   Load up on vegetables and fruits - one-half of your plate: Aim for color and variety, and remember that potatoes dont count.   Go for whole grains - one-quarter of your plate: Whole wheat, barley, wheat berries, quinoa,  oats, brown rice, and foods made with them. If you want pasta, go with whole wheat pasta.   Protein power - one-quarter of your plate: Fish, chicken, beans, and nuts are all healthy, versatile protein sources. Limit red meat.   The diet, however, does go beyond the plate, offering a few other suggestions.   Use healthy plant oils, such as olive, canola, soy, corn, sunflower and peanut. Check the labels, and avoid partially hydrogenated oil, which have unhealthy trans fats.   If youre thirsty, drink water. Coffee and tea are good in moderation, but skip sugary drinks and limit milk and dairy products to one or two daily servings.  The type of carbohydrate in the diet is more important than the amount. Some sources of carbohydrates, such as vegetables, fruits, whole grains, and beans-are healthier than others.   Finally, stay active  Signed, Thomasene Ripple, DO  09/12/2020 12:07 PM    [facility]

## 2020-09-12 NOTE — Patient Instructions (Addendum)
Medication Instructions:  No medication changes. *If you need a refill on your cardiac medications before your next appointment, please call your pharmacy*   Lab Work: None ordered If you have labs (blood work) drawn today and your tests are completely normal, you will receive your results only by: Marland Kitchen MyChart Message (if you have MyChart) OR . A paper copy in the mail If you have any lab test that is abnormal or we need to change your treatment, we will call you to review the results.   Testing/Procedures: Your physician has requested that you have an echocardiogram. Echocardiography is a painless test that uses sound waves to create images of your heart. It provides your doctor with information about the size and shape of your heart and how well your heart's chambers and valves are working. This procedure takes approximately one hour. There are no restrictions for this procedure.    WHY IS MY DOCTOR PRESCRIBING ZIO? The Zio system is proven and trusted by physicians to detect and diagnose irregular heart rhythms -- and has been prescribed to hundreds of thousands of patients.  The FDA has cleared the Zio system to monitor for many different kinds of irregular heart rhythms. In a study, physicians were able to reach a diagnosis 90% of the time with the Zio system1.  You can wear the Zio monitor -- a small, discreet, comfortable patch -- during your normal day-to-day activity, including while you sleep, shower, and exercise, while it records every single heartbeat for analysis.  1Barrett, P., et al. Comparison of 24 Hour Holter Monitoring Versus 14 Day Novel Adhesive Patch Electrocardiographic Monitoring. American Journal of Medicine, 2014.  ZIO VS. HOLTER MONITORING The Zio monitor can be comfortably worn for up to 14 days. Holter monitors can be worn for 24 to 48 hours, limiting the time to record any irregular heart rhythms you may have. Zio is able to capture data for the 51% of  patients who have their first symptom-triggered arrhythmia after 48 hours.1  LIVE WITHOUT RESTRICTIONS The Zio ambulatory cardiac monitor is a small, unobtrusive, and water-resistant patch--you might even forget you're wearing it. The Zio monitor records and stores every beat of your heart, whether you're sleeping, working out, or showering.  Wear the monitor for 7 days, remove 09/19/2020.  Your physician has requested that you have a stress echocardiogram. For further information please visit [email or web address] Please follow instruction sheet as given.    Follow-Up: At [facility], you and your health needs are our priority.  As part of our continuing mission to provide you with exceptional heart care, we have created designated Provider Care Teams.  These Care Teams include your primary Cardiologist (physician) and Advanced Practice Providers (APPs -  Physician Assistants and Nurse Practitioners) who all work together to provide you with the care you need, when you need it.  We recommend signing up for the patient portal called "MyChart".  Sign up information is provided on this After Visit Summary.  MyChart is used to connect with patients for Virtual Visits (Telemedicine).  Patients are able to view lab/test results, encounter notes, upcoming appointments, etc.  Non-urgent messages can be sent to your provider as well.   To learn more about what you can do with MyChart, go to [email or web address].    Your next appointment:   3 month(s)  The format for your next appointment:   In Person  Provider:   Thomasene Ripple, DO   Other Instructions  Exercise Stress Test An exercise stress  test is a test to check how your heart works during exercise. You will need to walk on a treadmill or ride an exercise bike for this test. An electrocardiogram (ECG) will record your heartbeat when you are at rest and when you are exercising. You may have an ultrasound or nuclear test after the exercise  test. The test is done to check for coronary artery disease (CAD). It is also done to:  See how well you can exercise.  Watch for high blood pressure during exercise.  Test how well you can exercise after treatment.  Check the blood flow to your arms and legs. If your test result is not normal, more testing may be needed. What happens before the procedure?  Follow instructions from your doctor about what you cannot eat or drink. ? Do not have any drinks or foods that have caffeine in them for 24 hours before the test, or as told by your doctor. This includes coffee, tea (even decaf tea), sodas, chocolate, and cocoa.  Ask your doctor about changing or stopping your normal medicines. This is important if you: ? Take diabetes medicines. ? Take beta-blocker medicines. ? Wear a nitroglycerin patch.  If you use an inhaler, bring it with you to the test.  Do not put lotions, powders, creams, or oils on your chest before the test.  Wear comfortable shoes and clothing.  Do not use any products that have nicotine or tobacco in them, such as cigarettes and e-cigarettes. Stop using them at least 4 hours before the test. If you need help quitting, ask your doctor. What happens during the procedure?   Patches (electrodes) will be put on your chest.  Wires will be connected to the patches. The wires will send signals to a machine to record your heartbeat.  Your heart rate will be watched while you are resting and while you are exercising. Your blood pressure will also be watched during the test.  You will walk on a treadmill or use a stationary bike. If you cannot use these, you may be asked to turn a crank with your hands.  The activity will get harder and will raise your heart rate.  You may be asked to breathe into a tube a few times during the test. This measures the gases that you breathe out.  You will be asked how you are feeling throughout the test.  You will exercise until your  heart reaches a target heart rate. You will stop early if: ? You feel dizzy. ? You have chest pain. ? You are out of breath. ? Your blood pressure is too high or too low. ? You have an irregular heartbeat. ? You have pain or aching in your arms or legs. The procedure may vary among doctors and hospitals. What happens after the procedure?  Your blood pressure, heart rate, breathing rate, and blood oxygen level will be watched after the test.  You may return to your normal diet and activities as told by your doctor.  It is up to you to get the results of your test. Ask your doctor, or the department that is doing the test, when your results will be ready. Summary  An exercise stress test is a test to check how your heart works during exercise.  This test is done to check for coronary artery disease.  Your heart rate will be watched while you are resting and while you are exercising.  Follow instructions from your doctor about what you cannot  eat or drink before the test. This information is not intended to replace advice given to you by your health care provider. Make sure you discuss any questions you have with your health care provider. Document Revised: 03/10/2019 Document Reviewed: 02/26/2017 Elsevier Patient Education  2020 [employer].

## 2020-09-21 DIAGNOSIS — H1010 Acute atopic conjunctivitis, unspecified eye: Secondary | ICD-10-CM | POA: Insufficient documentation

## 2020-09-21 DIAGNOSIS — J302 Other seasonal allergic rhinitis: Secondary | ICD-10-CM | POA: Insufficient documentation

## 2020-09-21 NOTE — Progress Notes (Signed)
Follow Up Note  RE: Nicole Carrillo MRN: [id number] DOB: December 06, 1986 Date of Office Visit: 09/22/2020  Referring provider: Natalia Leatherwood, DO Primary care provider: Natalia Leatherwood, DO  Chief Complaint: Asthma (doing better since last time, and has not taken the pepcid)  History of Present Illness: I had the pleasure of seeing Nicole Carrillo for a follow up visit at the [facility] on 09/22/2020. She is a 34 y.o. female, who is being followed for asthma, adverse food reaction, allergic rhinitis and dermatographism. Her previous allergy office visit was on 06/30/2020 with Dr. Selena Batten. Today is a regular follow up visit.  Asthma Currently on Breo 1 puff once and Singulair daily.  Using albuterol every 2 weeks - sometimes with exertion.  Denies any ER/urgent care visits or prednisone use since the last visit.  Adverse food reaction Currently avoiding sesame, peanuts and tree nuts.  Tolerating soy and wheat with no issues. Tried vegan protein shake which did not taste good.   Other allergic rhinitis Currently on zyrtec 10mg  twice a day with good benefit. Stopped dymista.  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   Dermatographism Did not try famotidine yet but still having itching. Concerned about side effects of taking all these medications.  Assessment and Plan: Nicole Carrillo is a 34 y.o. female with: Asthma Past history - Diagnosed with asthma in 2018 by pulmonology via ? Methacholine challenge test - data not available for review during OV. Tried Arnuity, Breo with no benefit. No triggers noted. Resolved during pregnancy. Now mainly complaining of chest pressure/tightness which sometimes resolves on its own with no medical therapy but takes 30 minutes. Tried Pepcid for few months with no benefit. Normal EGD in past per patient report. 2021 spirometry was normal with marginal  improvement in FEV1 post bronchodilator treatment. Clinically feeling about the same. Interim history - doing much better. Using albuterol twice a month.  Today's spirometry was normal. . Daily controller medication(s): continue Breo 2022 1 puff once a day and rinse mouth after each use.  . Continue with Singulair 10mg  daily at night.  . May use albuterol rescue inhaler 2 puffs every 4 to 6 hours as needed for shortness of breath, chest tightness, coughing, and wheezing. May use albuterol rescue inhaler 2 puffs 5 to 15 minutes prior to strenuous physical activities. Monitor frequency of use.  Seasonal and perennial allergic rhinoconjunctivitis Past history - Perennial rhino conjunctivitis symptoms for the past 3 years. 2018 skin testing by ENT was positive to grass, ragweed, weed, trees, molds, dust mites, cockroach, horse. SLIT therapy by ENT x few months with no benefit. Deviated septum repair and turbinate reduction in 2019. 2021 bloodwork positive to dust mites, grass pollen, mold, tree pollen, ragweed pollen and weed pollen. Dymista cause nausea.  Interim history - stable, thinking about allergy injections but concerned about reactions.  Continue environmental control measures.  Consider allergy injections for long term control - read about injections.   Will need to add horse to AIT if interested.   Continue with Zyrtec (cetirizine) 10mg  twice a day.  Adverse food reaction Past history - 2018 skin testing positive to soy, peanut and borderline to wheat. She had no prior clinical reactions to these foods. Avoiding certain cocoa products as well due to possibility of causing the chest tightness. 2021 bloodwork borderline positive to hazelnut, peanut, macademia nut,  pistachios and soy. Positive to sesame seed. Interim history - eating soy and wheat with no issues.   Continue to avoid sesame, peanuts and tree nuts.  For mild symptoms you can take over the counter antihistamines such as  Benadryl and monitor symptoms closely. If symptoms worsen or if you have severe symptoms including breathing issues, throat closure, significant swelling, whole body hives, severe diarrhea and vomiting, lightheadedness then inject epinephrine and seek immediate medical care afterwards.  Food action plan in place.   Consider food challenge to peanuts - must be off zyrtec for 3 days and Pepcid for 2 days.  If you can't come off the medications then will do it at a later time. Will plan on skin prick testing during challenge visit.   Dermatographism Still having itching at times. Did not try Pepcid yet.   Continue with Zyrtec (cetirizine) 10mg  twice a day as above.  Start famotidine 20mg  twice a day.  Continue proper skin care.  Return in about 3 months (around 12/23/2020).  Diagnostics: Spirometry:  Tracings reviewed. Her effort: Good reproducible efforts. FVC: 2.58L FEV1: 2.47L, 100% predicted FEV1/FVC ratio: 96% Interpretation: Spirometry consistent with normal pattern.  Please see scanned spirometry results for details.  Medication List:  Current Outpatient Medications  Medication Sig Dispense Refill  . albuterol (VENTOLIN HFA) 108 (90 Base) MCG/ACT inhaler Inhale 2 puffs into the lungs every 4 (four) hours as needed for wheezing or shortness of breath. 18 g 2  . cetirizine (ZYRTEC) 10 MG tablet Take 10 mg by mouth 2 (two) times daily.     EPINEPHrine (AUVI-Q) 0.3 mg/0.3 mL IJ SOAJ injection Inject 0.3 mLs (0.3 mg total) into the muscle as needed for anaphylaxis. 1 each 2  . fluticasone furoate-vilanterol (BREO ELLIPTA) 100-25 MCG/INH AEPB Inhale 1 puff into the lungs daily. Rinse mouth after each use. 60 each 5  . Glucagon 1 MG/0.2ML SOAJ Inject 1 mg into the skin as directed. 0.2 mL 1  . Insulin Human (INSULIN PUMP) SOLN Inject into the skin continuous. Humalog 100 units/mL    . Microlet Lancets MISC     . montelukast (SINGULAIR) 10 MG tablet Take 1 tablet (10 mg total) by  mouth daily. 90 tablet 1  . NON FORMULARY Continuous glucose monitor    . Prenatal Vit-Fe Fumarate-FA (PRENATAL MULTIVITAMIN) TABS tablet Take 1 tablet by mouth daily at 12 noon.     No current facility-administered medications for this visit.   Allergies: Allergies  Allergen Reactions  . Shellfish Allergy Shortness Of Breath and Rash    Crab causes SOB, Scallops cause rash.  Can eat Shrimp and lobster.  . Acetaminophen Other (See Comments)    Effects blood sugars and insulin pump.  . Cephalexin Nausea Only and Other (See Comments)    Headaches, dizziness  . Pneumococcal Vaccines Swelling    Local injection swelling  . Prednisone Nausea Only and Other (See Comments)    Headaches, dizziness, Prefers not to take because of uncontrollable BG's on this med;     12/25/2020 Soy Allergy Other (See Comments)    Blood test  . Adhesive [Tape] Rash    With extended exposure  . Peanut-Containing Drug Products Rash   I reviewed her past medical history, social history, family history, and environmental history and no significant changes have been reported from her previous visit.  Review of Systems  Constitutional: Negative for appetite change, chills, fever and unexpected weight change.  HENT: Negative for congestion and rhinorrhea.  Eyes: Negative for itching.  Respiratory: Negative for cough, chest tightness, shortness of breath and wheezing.   Cardiovascular: Negative for chest pain.  Gastrointestinal: Negative for abdominal pain.  Genitourinary: Negative for difficulty urinating.  Skin: Negative for rash.  Allergic/Immunologic: Positive for environmental allergies.  Neurological: Negative for headaches.   Objective: BP 120/74   Pulse 97   Temp (!) 97.2 F (36.2 C) (Temporal)   Resp 16   Ht 5\' 2"  (1.575 m)   Wt 101 lb 12.8 oz (46.2 kg)   SpO2 95%   BMI 18.62 kg/m  Body mass index is 18.62 kg/m. Physical Exam Vitals and nursing note reviewed.  Constitutional:      Appearance:  Normal appearance. She is well-developed.  HENT:     Head: Normocephalic and atraumatic.     Right Ear: Tympanic membrane and external ear normal.     Left Ear: Tympanic membrane and external ear normal.     Nose: Nose normal.     Mouth/Throat:     Mouth: Mucous membranes are moist.     Pharynx: Oropharynx is clear.  Eyes:     Conjunctiva/sclera: Conjunctivae normal.  Cardiovascular:     Rate and Rhythm: Normal rate and regular rhythm.     Heart sounds: Normal heart sounds. No murmur heard.   Pulmonary:     Effort: Pulmonary effort is normal.     Breath sounds: Normal breath sounds. No wheezing, rhonchi or rales.  Musculoskeletal:     Cervical back: Neck supple.  Skin:    General: Skin is warm.     Findings: No rash.  Neurological:     Mental Status: She is alert and oriented to person, place, and time.  Psychiatric:        Mood and Affect: Mood normal.        Behavior: Behavior normal.    Previous notes and tests were reviewed. The plan was reviewed with the patient/family, and all questions/concerned were addressed.  It was my pleasure to see Nicole Carrillo today and participate in her care. Please feel free to contact me with any questions or concerns.  Sincerely,  Tomasa Hosteller, DO Allergy & Immunology  [facility] office: [phone] [facility] office: [phone]

## 2020-09-22 ENCOUNTER — Encounter: Payer: Self-pay | Admitting: Allergy

## 2020-09-22 ENCOUNTER — Other Ambulatory Visit: Payer: Self-pay

## 2020-09-22 ENCOUNTER — Ambulatory Visit (INDEPENDENT_AMBULATORY_CARE_PROVIDER_SITE_OTHER): Payer: Managed Care, Other (non HMO) | Admitting: Allergy

## 2020-09-22 VITALS — BP 120/74 | HR 97 | Temp 97.2°F | Resp 16 | Ht 62.0 in | Wt 101.8 lb

## 2020-09-22 DIAGNOSIS — J45909 Unspecified asthma, uncomplicated: Secondary | ICD-10-CM

## 2020-09-22 DIAGNOSIS — T781XXD Other adverse food reactions, not elsewhere classified, subsequent encounter: Secondary | ICD-10-CM

## 2020-09-22 DIAGNOSIS — H1010 Acute atopic conjunctivitis, unspecified eye: Secondary | ICD-10-CM

## 2020-09-22 DIAGNOSIS — J302 Other seasonal allergic rhinitis: Secondary | ICD-10-CM | POA: Diagnosis not present

## 2020-09-22 DIAGNOSIS — L503 Dermatographic urticaria: Secondary | ICD-10-CM | POA: Diagnosis not present

## 2020-09-22 DIAGNOSIS — T7819XD Other adverse food reactions, not elsewhere classified, subsequent encounter: Secondary | ICD-10-CM

## 2020-09-22 DIAGNOSIS — J3089 Other allergic rhinitis: Secondary | ICD-10-CM

## 2020-09-22 NOTE — Assessment & Plan Note (Signed)
Past history - Diagnosed with asthma in 2018 by pulmonology via ? Methacholine challenge test - data not available for review during OV. Tried Arnuity, Breo with no benefit. No triggers noted. Resolved during pregnancy. Now mainly complaining of chest pressure/tightness which sometimes resolves on its own with no medical therapy but takes 30 minutes. Tried Pepcid for few months with no benefit. Normal EGD in past per patient report. 2021 spirometry was normal with marginal improvement in FEV1 post bronchodilator treatment. Clinically feeling about the same. Interim history - doing much better. Using albuterol twice a month.  Today's spirometry was normal. . Daily controller medication(s): continue Breo 1 puff once a day and rinse mouth after each use.  . Continue with Singulair 10mg  daily at night.  . May use albuterol rescue inhaler 2 puffs every 4 to 6 hours as needed for shortness of breath, chest tightness, coughing, and wheezing. May use albuterol rescue inhaler 2 puffs 5 to 15 minutes prior to strenuous physical activities. Monitor frequency of use.

## 2020-09-22 NOTE — Assessment & Plan Note (Signed)
Past history - 2018 skin testing positive to soy, peanut and borderline to wheat. She had no prior clinical reactions to these foods. Avoiding certain cocoa products as well due to possibility of causing the chest tightness. 2021 bloodwork borderline positive to hazelnut, peanut, macademia nut, pistachios and soy. Positive to sesame seed. Interim history - eating soy and wheat with no issues.   Continue to avoid sesame, peanuts and tree nuts.  For mild symptoms you can take over the counter antihistamines such as Benadryl and monitor symptoms closely. If symptoms worsen or if you have severe symptoms including breathing issues, throat closure, significant swelling, whole body hives, severe diarrhea and vomiting, lightheadedness then inject epinephrine and seek immediate medical care afterwards.  Food action plan in place.   Consider food challenge to peanuts - must be off zyrtec for 3 days and Pepcid for 2 days.  If you can't come off the medications then will do it at a later time. Will plan on skin prick testing during challenge visit.

## 2020-09-22 NOTE — Assessment & Plan Note (Signed)
Still having itching at times. Did not try Pepcid yet.   Continue with Zyrtec (cetirizine) 10mg  twice a day as above.  Start famotidine 20mg  twice a day.  Continue proper skin care.

## 2020-09-22 NOTE — Assessment & Plan Note (Addendum)
Past history - Perennial rhino conjunctivitis symptoms for the past 3 years. 2018 skin testing by ENT was positive to grass, ragweed, weed, trees, molds, dust mites, cockroach, horse. SLIT therapy by ENT x few months with no benefit. Deviated septum repair and turbinate reduction in 2019. 2021 bloodwork positive to dust mites, grass pollen, mold, tree pollen, ragweed pollen and weed pollen. Dymista cause nausea.  Interim history - stable, thinking about allergy injections but concerned about reactions.  Continue environmental control measures.  Consider allergy injections for long term control - read about injections.   Will need to add horse to AIT if interested.   Continue with Zyrtec (cetirizine) 10mg  twice a day.

## 2020-09-22 NOTE — Patient Instructions (Addendum)
Asthma:  . Daily controller medication(s): continue Breo 1 puff once a day and rinse mouth after each use.  . Continue with Singulair 10mg  daily at night.  . May use albuterol rescue inhaler 2 puffs every 4 to 6 hours as needed for shortness of breath, chest tightness, coughing, and wheezing. May use albuterol rescue inhaler 2 puffs 5 to 15 minutes prior to strenuous physical activities. Monitor frequency of use.  . Asthma control goals:  o Full participation in all desired activities (may need albuterol before activity) o Albuterol use two times or less a week on average (not counting use with activity) o Cough interfering with sleep two times or less a month o Oral steroids no more than once a year o No hospitalizations  Environmental allergies:  Continue environmental control measures - grass, ragweed, weed, trees, molds, dust mites, cockroach, horse.  Consider allergy injections for long term control - read about injections.   Continue with Zyrtec (cetirizine) 10mg  twice a day.  Food allergies:  Continue to avoid sesame, peanuts and tree nuts.  For mild symptoms you can take over the counter antihistamines such as Benadryl and monitor symptoms closely. If symptoms worsen or if you have severe symptoms including breathing issues, throat closure, significant swelling, whole body hives, severe diarrhea and vomiting, lightheadedness then inject epinephrine and seek immediate medical care afterwards.  Food action plan in place.   Consider food challenge to peanuts - must be off zyrtec for 3 days and Pepcid for 2 days.  If you can't come off the medications then will do it at a later time.   Skin   See below for proper skin care.  Start famotidine 20mg  twice a day and see if it helps with the itchy skin/rash.   Follow up in 3 months or sooner if needed.   Reducing Pollen Exposure . Pollen seasons: trees (spring), grass (summer) and ragweed/weeds (fall). Keep windows  closed in your home and car to lower pollen exposure.  12-15-1982 air conditioning in the bedroom and throughout the house if possible.  . Avoid going out in dry windy days - especially early morning. . Pollen counts are highest between 5 - 10 AM and on dry, hot and windy days.  . Save outside activities for late afternoon or after a heavy rain, when pollen levels are lower.  . Avoid mowing of grass if you have grass pollen allergy. 04-18-1981 Be aware that pollen can also be transported indoors on people and pets.  . Dry your clothes in an automatic dryer rather than hanging them outside where they might collect pollen.  . Rinse hair and eyes before bedtime. Control of House Dust Mite Allergen . Dust mite allergens are a common trigger of allergy and asthma symptoms. While they can be found throughout the house, these microscopic creatures thrive in warm, humid environments such as bedding, upholstered furniture and carpeting. . Because so much time is spent in the bedroom, it is essential to reduce mite levels there.  . Encase pillows, mattresses, and box springs in special allergen-proof fabric covers or airtight, zippered plastic covers.  . Bedding should be washed weekly in hot water (130 F) and dried in a hot dryer. Allergen-proof covers are available for comforters and pillows that can't be regularly washed.  Marland Kitchen the allergy-proof covers every few months. Minimize clutter in the bedroom. Keep pets out of the bedroom.  Lilian Kapur Keep humidity less than 50% by using a dehumidifier or air  conditioning. You can buy a humidity measuring device called a hygrometer to monitor this.  . If possible, replace carpets with hardwood, linoleum, or washable area rugs. If that's not possible, vacuum frequently with a vacuum that has a HEPA filter. . Remove all upholstered furniture and non-washable window drapes from the bedroom. . Remove all non-washable stuffed toys from the bedroom.  Wash stuffed toys weekly. Mold  Control . Mold and fungi can grow on a variety of surfaces provided certain temperature and moisture conditions exist.  . Outdoor molds grow on plants, decaying vegetation and soil. The major outdoor mold, Alternaria and Cladosporium, are found in very high numbers during hot and dry conditions. Generally, a late summer - fall peak is seen for common outdoor fungal spores. Rain will temporarily lower outdoor mold spore count, but counts rise rapidly when the rainy period ends. . The most important indoor molds are Aspergillus and Penicillium. Dark, humid and poorly ventilated basements are ideal sites for mold growth. The next most common sites of mold growth are the bathroom and the kitchen. Outdoor (Seasonal) Mold Control . Use air conditioning and keep windows closed. . Avoid exposure to decaying vegetation. Marland Kitchen Avoid leaf raking. . Avoid grain handling. . Consider wearing a face mask if working in moldy areas.  Indoor (Perennial) Mold Control  . Maintain humidity below 50%. . Get rid of mold growth on hard surfaces with water, detergent and, if necessary, 5% bleach (do not mix with other cleaners). Then dry the area completely. If mold covers an area more than 10 square feet, consider hiring an indoor environmental professional. . For clothing, washing with soap and water is best. If moldy items cannot be cleaned and dried, throw them away. . Remove sources e.g. contaminated carpets. . Repair and seal leaking roofs or pipes. Using dehumidifiers in damp basements may be helpful, but empty the water and clean units regularly to prevent mildew from forming. All rooms, especially basements, bathrooms and kitchens, require ventilation and cleaning to deter mold and mildew growth. Avoid carpeting on concrete or damp floors, and storing items in damp areas. Cockroach Allergen Avoidance Cockroaches are often found in the homes of densely populated urban areas, schools or commercial buildings, but these  creatures can lurk almost anywhere. This does not mean that you have a dirty house or living area. . Block all areas where roaches can enter the home. This includes crevices, wall cracks and windows.  . Cockroaches need water to survive, so fix and seal all leaky faucets and pipes. Have an exterminator go through the house when your family and pets are gone to eliminate any remaining roaches. Marland Kitchen Keep food in lidded containers and put pet food dishes away after your pets are done eating. Vacuum and sweep the floor after meals, and take out garbage and recyclables. Use lidded garbage containers in the kitchen. Wash dishes immediately after use and clean under stoves, refrigerators or toasters where crumbs can accumulate. Wipe off the stove and other kitchen surfaces and cupboards regularly.   Skin care recommendations  Bath time: . Always use lukewarm water. AVOID very hot or cold water. Marland Kitchen Keep bathing time to 5-10 minutes. . Do NOT use bubble bath. . Use a mild soap and use just enough to wash the dirty areas. . Do NOT scrub skin vigorously.  . After bathing, pat dry your skin with a towel. Do NOT rub or scrub the skin.  Moisturizers and prescriptions:  . ALWAYS apply moisturizers immediately after  bathing (within 3 minutes). This helps to lock-in moisture. . Use the moisturizer several times a day over the whole body. Peri [place] summer moisturizers include: Aveeno, CeraVe, Cetaphil. Peri [place] winter moisturizers include: Aquaphor, Vaseline, Cerave, Cetaphil, Eucerin, Vanicream. . When using moisturizers along with medications, the moisturizer should be applied about one hour after applying the medication to prevent diluting effect of the medication or moisturize around where you applied the medications. When not using medications, the moisturizer can be continued twice daily as maintenance.  Laundry and clothing: . Avoid laundry products with added color or perfumes. . Use unscented hypo-allergenic  laundry products such as Tide free, Cheer free & gentle, and All free and clear.  . If the skin still seems dry or sensitive, you can try double-rinsing the clothes. . Avoid tight or scratchy clothing such as wool. . Do not use fabric softeners or dyer sheets.

## 2020-09-30 ENCOUNTER — Telehealth: Payer: Self-pay

## 2020-09-30 ENCOUNTER — Telehealth: Payer: Self-pay | Admitting: Internal Medicine

## 2020-09-30 NOTE — Telephone Encounter (Signed)
Left message on patients voicemail to please return our call.   

## 2020-09-30 NOTE — Telephone Encounter (Signed)
Patient's husband is returning.

## 2020-09-30 NOTE — Telephone Encounter (Signed)
Please advise.  Rx requested have not been refill by Dr Lonzo Cloud. Last OV on 08/24/2020. Next appointment on 11/25/2020.

## 2020-09-30 NOTE — Telephone Encounter (Signed)
Medication Refill Request  Did you call your pharmacy and request this refill first? Yes  . If patient has not contacted pharmacy first, instruct them to do so for future refills.  . Remind them that contacting the pharmacy for their refill is the quickest method to get the refill.  . Refill policy also stated that it will take anywhere between 24-72 hours to receive the refill.    Name of medication? humalog (vials for insulin pump)  Is this a 90 day supply? yes  Name and location of pharmacy?  [id number] [place], [place] - [address] Phone:  [phone]  Fax:  [phone]

## 2020-09-30 NOTE — Telephone Encounter (Signed)
-----   Message from Kardie Tobb, DO sent at 09/30/2020 10:01 AM EDT ----- Great news your monitor is normal does not show any evidence of arrhythmia. 

## 2020-10-01 MED ORDER — HUMALOG 100 UNIT/ML ~~LOC~~ SOCT: SUBCUTANEOUS | 3 refills | Status: DC

## 2020-10-01 NOTE — Addendum Note (Signed)
Addended by: Scarlette Shorts on: 10/01/2020 08:38 AM   Modules accepted: Orders

## 2020-10-03 ENCOUNTER — Telehealth (HOSPITAL_COMMUNITY): Payer: Self-pay

## 2020-10-03 NOTE — Telephone Encounter (Signed)
Detailed instructions left on the patient's answering machine. Asked to call back with any questions. S.Zhamir Pirro EMTP 

## 2020-10-04 ENCOUNTER — Ambulatory Visit: Payer: Managed Care, Other (non HMO) | Admitting: Allergy

## 2020-10-04 ENCOUNTER — Telehealth: Payer: Self-pay

## 2020-10-04 NOTE — Telephone Encounter (Signed)
Left message on patients voicemail to please return our call.   

## 2020-10-04 NOTE — Telephone Encounter (Signed)
Pt returning morgans call, best number to reach the pt is [phone]

## 2020-10-04 NOTE — Telephone Encounter (Signed)
-----   Message from Kardie Tobb, DO sent at 09/30/2020 10:01 AM EDT ----- Great news your monitor is normal does not show any evidence of arrhythmia. 

## 2020-10-05 ENCOUNTER — Telehealth: Payer: Self-pay

## 2020-10-05 NOTE — Telephone Encounter (Signed)
Spoke with patient regarding results and recommendation.  Patient verbalizes understanding and is agreeable to plan of care. Advised patient to call back with any issues or concerns.  

## 2020-10-05 NOTE — Telephone Encounter (Signed)
-----   Message from Thomasene Ripple, DO sent at 09/30/2020 10:01 AM EDT ----- Randie Heinz news your monitor is normal does not show any evidence of arrhythmia.

## 2020-10-06 ENCOUNTER — Other Ambulatory Visit (HOSPITAL_COMMUNITY): Payer: Managed Care, Other (non HMO)

## 2020-10-06 ENCOUNTER — Ambulatory Visit (HOSPITAL_COMMUNITY): Payer: Managed Care, Other (non HMO) | Attending: Cardiovascular Disease

## 2020-10-06 ENCOUNTER — Other Ambulatory Visit: Payer: Self-pay

## 2020-10-06 ENCOUNTER — Ambulatory Visit (HOSPITAL_COMMUNITY): Payer: Managed Care, Other (non HMO)

## 2020-10-06 DIAGNOSIS — R079 Chest pain, unspecified: Secondary | ICD-10-CM | POA: Diagnosis present

## 2020-10-06 DIAGNOSIS — E109 Type 1 diabetes mellitus without complications: Secondary | ICD-10-CM | POA: Diagnosis present

## 2020-10-06 LAB — ECHOCARDIOGRAM STRESS TEST
Area-P 1/2: 3.68 cm2
S' Lateral: 2.7 cm

## 2020-10-14 ENCOUNTER — Other Ambulatory Visit: Payer: Self-pay

## 2020-10-14 ENCOUNTER — Other Ambulatory Visit: Payer: Self-pay | Admitting: Internal Medicine

## 2020-10-14 ENCOUNTER — Telehealth: Payer: Self-pay

## 2020-10-14 MED ORDER — INSULIN LISPRO 100 UNIT/ML ~~LOC~~ SOLN: SUBCUTANEOUS | 2 refills | Status: DC

## 2020-10-14 NOTE — Telephone Encounter (Signed)
Patient called today and stated she went to pick up Humalog Rx and when she got it home she realized it was cartridges instead of vile's-I sent in new and took old off of her med list-FYI

## 2020-11-14 ENCOUNTER — Ambulatory Visit (INDEPENDENT_AMBULATORY_CARE_PROVIDER_SITE_OTHER): Payer: 59 | Admitting: Licensed Clinical Social Worker

## 2020-11-14 DIAGNOSIS — F419 Anxiety disorder, unspecified: Secondary | ICD-10-CM
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
## 2020-11-25 ENCOUNTER — Encounter: Payer: Self-pay | Admitting: Internal Medicine

## 2020-11-25 ENCOUNTER — Ambulatory Visit: Payer: Managed Care, Other (non HMO) | Admitting: Internal Medicine

## 2020-11-25 ENCOUNTER — Other Ambulatory Visit: Payer: Self-pay

## 2020-11-25 VITALS — BP 122/80 | HR 80 | Ht 62.0 in | Wt 102.5 lb

## 2020-11-25 DIAGNOSIS — E109 Type 1 diabetes mellitus without complications: Secondary | ICD-10-CM

## 2020-11-25 DIAGNOSIS — E1065 Type 1 diabetes mellitus with hyperglycemia: Secondary | ICD-10-CM

## 2020-11-25 LAB — POCT GLYCOSYLATED HEMOGLOBIN (HGB A1C): Hemoglobin A1C: 8.2 % — AB (ref 4.0–5.6)

## 2020-11-25 NOTE — Progress Notes (Signed)
Name: Nicole Carrillo  Age/ Sex: 34 y.o., female   MRN/ DOB: [id number], 12-07-1986     PCP: Natalia Leatherwood, Nicole Carrillo   Reason for Endocrinology Evaluation: Type 1 Diabetes Mellitus  Initial Endocrine Consultative Visit: 8/15/221    PATIENT IDENTIFIER: Nicole Carrillo is a 34 y.o. female with a past medical history of T1DM and Hashimoto's Thyroiditis. The patient has followed with Endocrinology clinic since 04/26/2020 for consultative assistance with management of her diabetes.  DIABETIC HISTORY:  Nicole Carrillo was initially diagnosed with T2DM at age 42 but this diagnosis was subsequently changed to T1DM after hospitalization for hyperglycemia and HHS. It is unclear to me the circumstances of her diagnosis. She has been on insulin pumps for  Years. Her hemoglobin A1c has ranged from 6.6% in 2021, peaking at 7.7% in 2020.   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 We adjusted pump settings  Today (11/25/2020): Nicole Carrillo is here for a follow up on diabetes management.   She checks her blood sugars multiple  times daily through the [employer] CGM. The patient has not had hypoglycemic episodes since the last clinic visit, she is not nursing as much .     Tried the Automode but  That caused hyperglycemia   HOME DIABETES REGIMEN:  HUmalog     Pump   MEDTRONIC   Settings   Insulin type   Humalog    Basal rate       0000-0400  0.200 u/h (0.250)   0400-0700  0.350 u/h (0.375)   0700-1200  0.675 u/h (0.650)   1200-1800 0.600 u/h (0.550)   1800-0000 0.450 u/hr (0.450)          I:C ratio       0000-0700  1:13   0700-1100 7.5 (8)   1100-000 10          Sensitivity       0000  60      AIT     0000  4       Type & Model of Pump: Medtronic 770 Insulin Type: Currently using Humalog  PUMP  ADHERENCE: Bolus Wizard - using most of the time - with food and correction  Bolus Wizard Override- used  - no manual boluses  Infusion Set Changes- q 3days  Reservoir change - every 9 days   CURRENT PUMP STATISTICS: Auto mode = 0% Manual mode = 100% Sensor wear = 92% Average BG: 218 +/- 72 Average SG: 180 +/- 62 Estimated A1C: 7.6 BG checks / Calibrations/day: 3.5 Average Daily Carbs (g): 94+/- 31 Average Total Daily Insulin:  23.8units Average Daily Bolus: 13.3 units (56%) Average Daily Basal:  10.5units (44%) Time in Range: High (>250) = 15% Above target (180-250) =28 % In Range (70-180) = 56% Below Target (<70 but >50) = 1% Low (<50) =0 %      Statin: no ACE-I/ARB: no     DIABETIC COMPLICATIONS: Microvascular complications:   Psoterior retinal detachment and bleed that is attributed to HTN  Denies: CKD, neuropathy  Last Eye Exam: Completed 01/2020  Macrovascular complications:    Denies: CAD, CVA, PVD   HISTORY:  Past Medical History:  Past Medical History:  Diagnosis Date   Allergy    Angio-edema    Anxiety    Asthma    Chicken pox  Constipation    Diabetes mellitus type 1 (HCC)    Dysphagia    Dysthymia    Eczema    Elevated platelet count    Fetal CPAM    Folliculitis    GERD (gastroesophageal reflux disease)    Hashimoto's thyroiditis    History of migraine    Hyperlipidemia    Insulin pump in place    Left breast mass 01/14/2017   fibroadenoma- benign   OCD (obsessive compulsive disorder)    Orthodontics    permanent upper and lower retainers   Preeclampsia 02/24/2020   PVD (posterior vitreous detachment), right    Rectal bleeding    Rectal fissure    Tinnitus    Urticaria    Wears contact lenses    Past Surgical History:  Past Surgical History:  Procedure Laterality Date   DENTAL SURGERY     ENDOSCOPIC CONCHA BULLOSA RESECTION Bilateral 07/24/2018   Procedure: ENDOSCOPIC CONCHA BULLOSA  MIDDLE TURBINATE;  Surgeon: Vernie Murders, Nicole Carrillo;  [facility]: [facility];  Service: ENT;  Laterality: Bilateral;  Diabetic - insulin pump   SEPTOPLASTY N/A 07/24/2018   Procedure: SEPTOPLASTY;  Surgeon: Vernie Murders, Nicole Carrillo;  Location: [facility];  Service: ENT;  Laterality: N/A;  NEEDS TO BE FIRST INSULIN PUMP   TURBINATE REDUCTION Bilateral 07/24/2018   Procedure: INFERIOR TURBINATE REDUCTION;  Surgeon: Vernie Murders, Nicole Carrillo;  Location: [facility];  Service: ENT;  Laterality: Bilateral;   WISDOM TOOTH EXTRACTION      Social History:  reports that she has never smoked. She has never used smokeless tobacco. She reports previous alcohol use of about 1.0 standard drink of alcohol per week. She reports previous drug use. Family History:  Family History  Problem Relation Age of Onset   Arthritis Mother    Stroke Mother    Miscarriages / [place] Mother    Arthritis Father    Dementia Maternal Grandmother    Emphysema Maternal Grandfather      HOME MEDICATIONS: Allergies as of 11/25/2020      Reactions   Shellfish Allergy Shortness Of Breath, Rash   Crab causes SOB, Scallops cause rash.  Can eat Shrimp and lobster.   Acetaminophen Other (See Comments)   Effects blood sugars and insulin pump.   Cephalexin Nausea Only, Other (See Comments)   Headaches, dizziness   Pneumococcal Vaccines Swelling   Local injection swelling   Prednisone Nausea Only, Other (See Comments)   Headaches, dizziness, Prefers not to take because of uncontrollable BG's on this med;    Soy Allergy Other (See Comments)   Blood test   Adhesive [tape] Rash   With extended exposure   Peanut-containing Drug Products Rash      Medication List       Accurate as of November 25, 2020 12:54 PM. If you have any questions, ask your nurse or doctor.        albuterol 108 (90 Base) MCG/ACT inhaler Commonly known as: VENTOLIN HFA Inhale 2 puffs into the lungs every 4 (four) hours as needed for  wheezing or shortness of breath.   Breo Ellipta 100-25 MCG/INH Aepb Generic drug: fluticasone furoate-vilanterol Inhale 1 puff into the lungs daily. Rinse mouth after each use.   cetirizine 10 MG tablet Commonly known as: ZYRTEC Take 10 mg by mouth 2 (two) times daily.   EPINEPHrine 0.3 mg/0.3 mL Soaj injection Commonly known as: Auvi-Q Inject 0.3 mLs (0.3 mg total) into the muscle as needed for anaphylaxis.   Glucagon 1  MG/0.2ML Soaj Inject 1 mg into the skin as directed.   insulin lispro 100 UNIT/ML injection Commonly known as: HUMALOG Use 40 max units daily in insulin pump   Microlet Lancets Misc   montelukast 10 MG tablet Commonly known as: SINGULAIR Take 1 tablet (10 mg total) by mouth daily.   NON FORMULARY Continuous glucose monitor   prenatal multivitamin Tabs tablet Take 1 tablet by mouth daily at 12 noon.        OBJECTIVE:   Vital Signs: BP 122/80    Pulse 80    Ht 5\' 2"  (1.575 m)    Wt 102 lb 8 oz (46.5 kg)    LMP  (LMP Unknown)    SpO2 98%    Breastfeeding Yes    BMI 18.75 kg/m    Wt Readings from Last 3 Encounters:  11/25/20 102 lb 8 oz (46.5 kg)  09/22/20 101 lb 12.8 oz (46.2 kg)  09/12/20 104 lb (47.2 kg)     Exam: General: Pt appears well and is in NAD  Lungs: Clear with good BS bilat with no rales, rhonchi, or wheezes  Heart: RRR with normal S1 and S2 and no gallops; no murmurs; no rub  Abdomen: Normoactive bowel sounds, soft, nontender, without masses or organomegaly palpable  Extremities: No pretibial edema.   Neuro: MS is good with appropriate affect, pt is alert and Ox3    DM foot exam: 04/26/2020  The skin of the feet is intact without sores or ulcerations. The pedal pulses are 2+ on right and 2+ on left. The sensation is intact to a screening 5.07, 10 gram monofilament bilaterally  DATA REVIEWED:  Lab Results  Component Value Date   HGBA1C 8.2 (A) 11/25/2020   HGBA1C 7.0 (A) 07/28/2020   HGBA1C 7.0 (H) 02/23/2020   Lab  Results  Component Value Date   MICROALBUR 0.9 04/05/2020   CREATININE 0.78 08/24/2020   Lab Results  Component Value Date   MICRALBCREAT 1.3 04/05/2020   Results for Nicole Carrillo, Nicole Carrillo (MRN [name]) as of 08/25/2020 08:20  Ref. Range 08/24/2020 13:58  Sodium Latest Ref Range: 135 - 145 mEq/L 136  Potassium Latest Ref Range: 3.5 - 5.1 mEq/L 4.0  Chloride Latest Ref Range: 96 - 112 mEq/L 100  CO2 Latest Ref Range: 19 - 32 mEq/L 29  Glucose Latest Ref Range: 70 - 99 mg/dL 08/26/2020 (H)  BUN Latest Ref Range: 6 - 23 mg/dL 17  Creatinine Latest Ref Range: 0.40 - 1.20 mg/dL 782  Calcium Latest Ref Range: 8.4 - 10.5 mg/dL 9.56  Alkaline Phosphatase Latest Ref Range: 39 - 117 U/L 99  Albumin Latest Ref Range: 3.5 - 5.2 g/dL 4.5  AST Latest Ref Range: 0 - 37 U/L 20  ALT Latest Ref Range: 0 - 35 U/L 22  Total Protein Latest Ref Range: 6.0 - 8.3 g/dL 8.1  Total Bilirubin Latest Ref Range: 0.2 - 1.2 mg/dL 0.7  GFR Latest Ref Range: >60.00 mL/min 102.12  VITD Latest Ref Range: 30.00 - 100.00 ng/mL 36.94  WBC Latest Ref Range: 4.0 - 10.5 K/uL 5.0  RBC Latest Ref Range: 3.87 - 5.11 Mil/uL 4.38  Hemoglobin Latest Ref Range: 12.0 - 15.0 g/dL 21.3  HCT Latest Ref Range: 36 - 46 % 39.7  MCV Latest Ref Range: 78.0 - 100.0 fl 90.6  MCHC Latest Ref Range: 30.0 - 36.0 g/dL 08.6  RDW Latest Ref Range: 11.5 - 15.5 % 14.5  Platelets Latest Ref Range: 150 - 400 K/uL 365.0  Neutrophils Latest Ref Range: 43 - 77 % 65.1  Lymphocytes Latest Ref Range: 12 - 46 % 25.9  Monocytes Relative Latest Ref Range: 3 - 12 % 7.1  Eosinophil Latest Ref Range: 0 - 5 % 1.0  Basophil Latest Ref Range: 0 - 3 % 0.9  NEUT# Latest Ref Range: 1.4 - 7.7 K/uL 3.2  Lymphocyte # Latest Ref Range: 0.7 - 4.0 K/uL 1.3  Monocyte # Latest Ref Range: 0 - 1 K/uL 0.4  Eosinophils Absolute Latest Ref Range: 0 - 0 K/uL 0.1  Basophils Absolute Latest Ref Range: 0 - 0 K/uL 0.0  TSH Latest Ref Range: 0.35 - 4.50 uIU/mL 0.73  Triiodothyronine  (T3) Latest Ref Range: 76 - 181 ng/dL 80  Y6,AYTK(ZSWFUX) Latest Ref Range: 0.60 - 1.60 ng/dL 3.23   ASSESSMENT / PLAN / RECOMMENDATIONS:   1) Type 1 Diabetes Mellitus, Poorly  controlled, Without complications - Most recent A1c of 8.2 %. Goal A1c < 7.0 %.     - Unfortunately , she has been noted with hyperglycemia despite self increasing basal rate  - She continues to be in 100 % manual mode, states the automode caused hyperglycemia , I am going to refer her to our CDE for further training and adjustment of this.  - Pt also advised to change the reservoir every 3rd day rather then 7-8 days  - Will increase her basal insulin as below as well as adjust I: C ratio    MEDICATIONS:  Humalog    Pump   MEDTRONIC   Settings   Insulin type   Humalog    Basal rate       0000-0400  0.275u/h    0400-0700  0.425 u/h   0700-1200  0.700 u/h    1200-1800 0.600 u/h    1800-0000 0.500 u/hr          I:C ratio       0000-0700  12   0700-1100 7   1100-000 10          Sensitivity       0000  60      AIT     0000  4         EDUCATION / INSTRUCTIONS:  BG monitoring instructions: Patient is instructed to check her blood sugars 4 times a day, before meals and bedtime   Call [place] [facility] if: BG persistently < 70 .  I reviewed the Rule of 15 for the treatment of hypoglycemia in detail with the patient. Literature supplied.   2) Diabetic complications:   Eye: Does not have known diabetic retinopathy.   Neuro/ Feet: Does not have known diabetic peripheral neuropathy .   Renal: Patient does not have known baseline CKD. She   is  on an ACEI/ARB at present.   3) Hashimoto's Disease:  - No local neck symptoms  - TFT's are normal     F/U in 4 weeks    Signed electronically by: Lyndle Herrlich, Nicole Carrillo  [facility] End[facility]  [facility] [address] [place] [place], [place] [address] Phone:  [phone] FAX: [phone]   CC: Natalia Leatherwood, Nicole Carrillo [address] [place] [address] Phone: [phone]  Fax: [phone]  Return to Endocrinology clinic as below: Future Appointments  Date Time Provider Department Center  12/19/2020  2:00 PM Nicole Carrillo, Nicole Carrillo [id number] None  12/29/2020 10:20 AM Nicole Carrillo, Nicole [place], Nicole Carrillo [facility] None  01/05/2021 11:15 AM Nicole Sia, Nicole Carrillo [facility] None  01/06/2021 11:10 AM Nicole Carrillo, Nicole DoloresIbtehal Jaralla, Nicole Carrillo [id number] None

## 2020-12-19 ENCOUNTER — Ambulatory Visit (INDEPENDENT_AMBULATORY_CARE_PROVIDER_SITE_OTHER): Payer: 59 | Admitting: Licensed Clinical Social Worker

## 2020-12-19 DIAGNOSIS — F419 Anxiety disorder, unspecified: Secondary | ICD-10-CM

## 2020-12-19 NOTE — Progress Notes (Signed)
Virtual Visit via Video Note  I connected with Nicole Carrillo on 12/19/20 at  2:00 PM EST by a video enabled telemedicine application and verified that I am speaking with the correct person using two identifiers.  Location: Patient: home  Provider: home office   I discussed the limitations of evaluation and management by telemedicine and the availability of in person appointments. The patient expressed understanding and agreed to proceed.  I discussed the assessment and treatment plan with the patient. The patient was provided an opportunity to ask questions and all were answered. The patient agreed with the plan and demonstrated an understanding of the instructions.   The patient was advised to call back or seek an in-person evaluation if the symptoms worsen or if the condition fails to improve as anticipated.  I provided 55 minutes of non-face-to-face time during this encounter.  THERAPIST PROGRESS NOTE  Session Time: 2:00 PM to 2:55 Pm  Participation Level: Active  Behavioral Response: CasualAlertAnxious  Type of Therapy: Individual Therapy  Treatment Goals addressed:  Anxiety, coping  Interventions: CBT, Solution Focused, Strength-based, Supportive, Reframing and Other: coping  Summary: Nicole Carrillo is a 35 y.o. female who presents with better over past two weeks but endorses chronic stress. Better because not at school. Enjoy time with baby. Admitted had  Qualifying project for doctorate. Worked up, couldn't enjoy holidays if didn't pass and didn't know until week after the holidays. Couldn't let it go and do more work on it in the event she would have to do it again because she would have limited time to do again. Husband has pointed out and she realizes that she puts her own deadlines on things that are not anyone's but hers. Patient explains why that if she doesn't do now more later. It is a way to be efficient but there is stress with the time to put things in to get  things set up. Realizes can't win with herself. Usually there is a reason for this even though there is a stress of planning-easier the future, doesn't want over her head, efficiency. With so much on her plate she needs organization things won't get done that have to get done. Have to do certain way if don't do something a certain way won't get done. Lot of things are important to get done. Drop the ball and then cascades. Reviewed any places that she could let go more that would ease things up and that she can't plan for every eventuality anyway, the unpredictability of life and in fact can't control everything. Patient shares as well she stopped breast pumping and this emotional. Patient realizes if she could sustain less control it would help her mind, help gain weight and happy to do things doing. With all the planning she loses out on being present and enjoying the moment. Discussed where to find the balance between planning enough but also letting go. When husband home things are better.  Therapist said that this is a lot that there is a lot on patient's plate that gets easier with husband around.  Patient said may be not obsessed so much about every assignment might be a place to start.  Reviewed her struggles with COVID her fears bring it into the house with her autoimmune, and her infant how that would impact everything in their life, how it would fall and hurt to manage and how that would be difficult if she were to get it.  Not sure how strict to be but at this  point realizes she goes to extreme [employer] washes groceries etc.  Discussed finding that place in coming to meeting of the mind with husband and knowing they will make the best decisions together what is best for their family.  Therapist again bringing up not being able to eliminate every possible risk of COVID but taking all precautions that are reasonable as a guiding principal.  Patient realizes its been 2 years and needs to get on with life,  husband is a Occupational hygienist and would like to meet them in [place] in other words to start living her life again yet still has the fear of COVID standing in the way.  Described a holiday with family that she really enjoyed was able to be together their process why that was possible and she relates because she trusts her family.  Therapist both validating patient on the need to take precautions but also recognizing COVID is a changing phenomenon and making around it  Suicidal/Homicidal: No  Therapist Response: Therapist reviewed symptoms, facilitated expression of thoughts and feeling and work with patient on excessive need to control that is a trait that can perpetuate anxiety.  Discussed unpredictability of life, that it is inevitable you encounter changes in your environment the way other choose to behave in your own physical health that you are simply unable to predict or control.  Discussed attempts to control are not going to eliminate unpredictability, discussed letting go as an attitude that it helps adapt to changes, recognition of the future is unknown not our control so attempts to control are unhelpful expenditures of energy often.  Discussed that does help with efficiency but patient working on this would be helpful for her anxiety, discussed other way of expressing it is acceptance.  Challenged patient to find places that she can let go in her life would be helpful.  Discussed the balance to figure out between being prepared but letting go and trusting the process of life.  Review helpful to practice how she talks to herself to tell herself is this something and focused on the future on my control and then refocus on something in the present and put her energy into this.  Helpful to not be so future minded to manage anxiety, more focus on current problem solving but not in the attempt to manage every contingency in the future.  Therapist provided active listening, open questions supportive interventions.   Worked in session also on balance patient confined and managing COVID with her concerns of an infected an autoimmune disease and where to find that back balance therapist provided active listening, open questions supportive intervention Plan: Return again in 4 weeks.2. therapist working with patient on excessive need to control and ways that she can ease up that would be reasonable given her schedule, given her responsibilities and concerns.  Please help patient develop attitudes that we will help her in managing her stress as well as stress management strategies  Diagnosis: Axis I: Anxiety Disorder unspecified    Axis II: No diagnosis    Coolidge Breeze, LCSW 12/19/2020

## 2020-12-26 ENCOUNTER — Ambulatory Visit: Payer: Managed Care, Other (non HMO) | Admitting: Cardiology

## 2020-12-28 DIAGNOSIS — T7840XA Allergy, unspecified, initial encounter: Secondary | ICD-10-CM | POA: Insufficient documentation

## 2020-12-28 DIAGNOSIS — H43811 Vitreous degeneration, right eye: Secondary | ICD-10-CM | POA: Insufficient documentation

## 2020-12-28 DIAGNOSIS — R1310 Dysphagia, unspecified: Secondary | ICD-10-CM | POA: Insufficient documentation

## 2020-12-28 DIAGNOSIS — K602 Anal fissure, unspecified: Secondary | ICD-10-CM | POA: Insufficient documentation

## 2020-12-28 DIAGNOSIS — E785 Hyperlipidemia, unspecified: Secondary | ICD-10-CM | POA: Insufficient documentation

## 2020-12-28 DIAGNOSIS — Z9641 Presence of insulin pump (external) (internal): Secondary | ICD-10-CM | POA: Insufficient documentation

## 2020-12-28 DIAGNOSIS — K219 Gastro-esophageal reflux disease without esophagitis: Secondary | ICD-10-CM | POA: Insufficient documentation

## 2020-12-28 DIAGNOSIS — T783XXA Angioneurotic edema, initial encounter: Secondary | ICD-10-CM | POA: Insufficient documentation

## 2020-12-28 DIAGNOSIS — O35BXX0 Maternal care for other (suspected) fetal abnormality and damage, fetal cardiac anomalies, not applicable or unspecified: Secondary | ICD-10-CM | POA: Insufficient documentation

## 2020-12-28 DIAGNOSIS — L309 Dermatitis, unspecified: Secondary | ICD-10-CM | POA: Insufficient documentation

## 2020-12-28 DIAGNOSIS — F429 Obsessive-compulsive disorder, unspecified: Secondary | ICD-10-CM | POA: Insufficient documentation

## 2020-12-28 DIAGNOSIS — F419 Anxiety disorder, unspecified: Secondary | ICD-10-CM | POA: Insufficient documentation

## 2020-12-28 DIAGNOSIS — L509 Urticaria, unspecified: Secondary | ICD-10-CM | POA: Insufficient documentation

## 2020-12-28 DIAGNOSIS — R7989 Other specified abnormal findings of blood chemistry: Secondary | ICD-10-CM | POA: Insufficient documentation

## 2020-12-28 DIAGNOSIS — K625 Hemorrhage of anus and rectum: Secondary | ICD-10-CM | POA: Insufficient documentation

## 2020-12-28 DIAGNOSIS — Z973 Presence of spectacles and contact lenses: Secondary | ICD-10-CM | POA: Insufficient documentation

## 2020-12-28 DIAGNOSIS — Z464 Encounter for fitting and adjustment of orthodontic device: Secondary | ICD-10-CM | POA: Insufficient documentation

## 2020-12-28 DIAGNOSIS — B019 Varicella without complication: Secondary | ICD-10-CM | POA: Insufficient documentation

## 2020-12-28 DIAGNOSIS — L739 Follicular disorder, unspecified: Secondary | ICD-10-CM | POA: Insufficient documentation

## 2020-12-28 DIAGNOSIS — O358XX0 Maternal care for other (suspected) fetal abnormality and damage, not applicable or unspecified: Secondary | ICD-10-CM | POA: Insufficient documentation

## 2020-12-28 DIAGNOSIS — F341 Dysthymic disorder: Secondary | ICD-10-CM | POA: Insufficient documentation

## 2020-12-28 DIAGNOSIS — E109 Type 1 diabetes mellitus without complications: Secondary | ICD-10-CM | POA: Insufficient documentation

## 2020-12-28 DIAGNOSIS — H9319 Tinnitus, unspecified ear: Secondary | ICD-10-CM | POA: Insufficient documentation

## 2020-12-28 DIAGNOSIS — Z8669 Personal history of other diseases of the nervous system and sense organs: Secondary | ICD-10-CM | POA: Insufficient documentation

## 2020-12-28 DIAGNOSIS — K5900 Constipation, unspecified: Secondary | ICD-10-CM | POA: Insufficient documentation

## 2020-12-29 ENCOUNTER — Ambulatory Visit: Payer: Managed Care, Other (non HMO) | Admitting: Cardiology

## 2020-12-30 ENCOUNTER — Encounter: Payer: Self-pay | Admitting: Cardiology

## 2020-12-30 ENCOUNTER — Telehealth (INDEPENDENT_AMBULATORY_CARE_PROVIDER_SITE_OTHER): Payer: Managed Care, Other (non HMO) | Admitting: Cardiology

## 2020-12-30 VITALS — BP 126/84 | HR 86 | Ht 62.0 in | Wt 99.8 lb

## 2020-12-30 DIAGNOSIS — O165 Unspecified maternal hypertension, complicating the puerperium: Secondary | ICD-10-CM

## 2020-12-30 DIAGNOSIS — O1490 Unspecified pre-eclampsia, unspecified trimester: Secondary | ICD-10-CM

## 2020-12-30 DIAGNOSIS — O99345 Other mental disorders complicating the puerperium: Secondary | ICD-10-CM | POA: Diagnosis not present

## 2020-12-30 DIAGNOSIS — E109 Type 1 diabetes mellitus without complications: Secondary | ICD-10-CM | POA: Diagnosis not present

## 2020-12-30 DIAGNOSIS — F53 Postpartum depression: Secondary | ICD-10-CM | POA: Insufficient documentation

## 2020-12-30 NOTE — Progress Notes (Addendum)
telehealth video visit  Date:  12/30/2020   ID:  Nicole Carrillo, DOB June 01, 1986, MRN [id number]  The patient is at home. I am in the office.  PCP:  Natalia Leatherwood, DO  Cardiologist:  Thomasene Ripple, DO  Electrophysiologist:  None   Evaluation Performed:    Chief Complaint:  I am doing well  History of Present Illness:    Nicole Carrillo is a 35 y.o. female with of asthma, preeclampsia, postpartum hypertension, hyperlipidemia, Hashimoto's thyroiditis and type 1 diabetes diagnosed at 89 years,Asthma is here today for a follow up.   I saw the patient on 09/12/2020 at that she reported chest pain, I send the patient for Stress echo and placed a 14 day monitor due to palpitation. I also recommended that patient to behavior health for postpartum depression.   Her testing were all reported to be normal. She is happy with the visit with behavioral health. She offers no complaints. She feels she is closer to her baseline and was happy to get a break over the holidays.  She start back in school soon.   The patient does not have symptoms concerning for COVID-19 infection .  The patient consents to telehealth video visit.  Past Medical History:  Diagnosis Date  . Allergy   . Angio-edema   . Anxiety   . Asthma   . Chicken pox   . Constipation   . Diabetes mellitus type 1 (HCC)   . Dysphagia   . Dysthymia   . Eczema   . Elevated platelet count   . Fetal CPAM   . Folliculitis   . GERD (gastroesophageal reflux disease)   . Hashimoto's thyroiditis   . History of migraine   . Hyperlipidemia   . Insulin pump in place   . Left breast mass 01/14/2017   fibroadenoma- benign  . OCD (obsessive compulsive disorder)   . Orthodontics    permanent upper and lower retainers  . Preeclampsia 02/24/2020  . PVD (posterior vitreous detachment), right   . Rectal bleeding   . Rectal fissure   . Tinnitus   . Urticaria   . Wears contact lenses    Past Surgical History:  Procedure Laterality Date   . DENTAL SURGERY    . ENDOSCOPIC CONCHA BULLOSA RESECTION Bilateral 07/24/2018   Procedure: ENDOSCOPIC CONCHA BULLOSA MIDDLE TURBINATE;  Surgeon: Vernie Murders, MD;  [facility];  Service: ENT;  Laterality: Bilateral;  Diabetic - insulin pump  . SEPTOPLASTY N/A 07/24/2018   Procedure: SEPTOPLASTY;  Surgeon: Vernie Murders, MD;  Location: [facility];  Service: ENT;  Laterality: N/A;  NEEDS TO BE FIRST INSULIN PUMP  . TURBINATE REDUCTION Bilateral 07/24/2018   Procedure: INFERIOR TURBINATE REDUCTION;  Surgeon: Vernie Murders, MD;  Location: [facility];  Service: ENT;  Laterality: Bilateral;  . WISDOM TOOTH EXTRACTION       Current Meds  Medication Sig  . albuterol (VENTOLIN HFA) 108 (90 Base) MCG/ACT inhaler Inhale 2 puffs into the lungs every 4 (four) hours as needed for wheezing or shortness of breath.  . cetirizine (ZYRTEC) 10 MG tablet Take 10 mg by mouth 2 (two) times daily.   Marland Kitchen EPINEPHrine (AUVI-Q) 0.3 mg/0.3 mL IJ SOAJ injection Inject 0.3 mLs (0.3 mg total) into the muscle as needed for anaphylaxis.  . fluticasone furoate-vilanterol (BREO ELLIPTA) 100-25 MCG/INH AEPB Inhale 1 puff into the lungs daily. Rinse mouth after each use.  Marland Kitchen Glucagon 1 MG/0.2ML SOAJ Inject 1 mg into the skin as directed.  Marland Kitchen  insulin lispro (HUMALOG) 100 UNIT/ML injection Use 40 max units daily in insulin pump  . Microlet Lancets MISC   . montelukast (SINGULAIR) 10 MG tablet Take 1 tablet (10 mg total) by mouth daily.  . NON FORMULARY Continuous glucose monitor  . Prenatal Vit-Fe Fumarate-FA (PRENATAL MULTIVITAMIN) TABS tablet Take 1 tablet by mouth daily at 12 noon.     Allergies:   Shellfish allergy, Acetaminophen, Cephalexin, Pneumococcal vaccines, Prednisone, Soy allergy, Adhesive [tape], and Peanut-containing drug products   Social History   Tobacco Use  . Smoking status: Never Smoker  . Smokeless tobacco: Never Used  Vaping Use  . Vaping Use: Never used  Substance  Use Topics  . Alcohol use: Not Currently    Alcohol/week: 1.0 standard drink    Types: 1 Glasses of wine per week  . Drug use: Not Currently     Family Hx: The patient's family history includes Arthritis in her father and mother; Dementia in her maternal grandmother; Emphysema in her maternal grandfather; Miscarriages / [place] in her mother; Stroke in her mother.  ROS:   Please see the history of present illness.    Review of Systems  Constitution: Negative for decreased appetite, fever and weight gain.  HENT: Negative for congestion, ear discharge, hoarse voice and sore throat.   Eyes: Negative for discharge, redness, vision loss in right eye and visual halos.  Cardiovascular: Negative for chest pain, dyspnea on exertion, leg swelling, orthopnea and palpitations.  Respiratory: Negative for cough, hemoptysis, shortness of breath and snoring.   Endocrine: Negative for heat intolerance and polyphagia.  Hematologic/Lymphatic: Negative for bleeding problem. Does not bruise/bleed easily.  Skin: Negative for flushing, nail changes, rash and suspicious lesions.  Musculoskeletal: Negative for arthritis, joint pain, muscle cramps, myalgias, neck pain and stiffness.  Gastrointestinal: Negative for abdominal pain, bowel incontinence, diarrhea and excessive appetite.  Genitourinary: Negative for decreased libido, genital sores and incomplete emptying.  Neurological: Negative for brief paralysis, focal weakness, headaches and loss of balance.  Psychiatric/Behavioral: Negative for altered mental status, depression and suicidal ideas.  Allergic/Immunologic: Negative for HIV exposure and persistent infections.     Prior CV studies:   The following studies were reviewed today:  Stress echo IMPRESSIONS   1. This is a negative stress echocardiogram for ischemia.  2. Excellent exercise capacity (11:47 min:s; 13.4 METS). Normal BP/HR  response to exercise.  3. This is a low risk study.    FINDINGS   Exam Protocol: The patient exercised on a treadmill according to a Bruce  protocol.    Patient Performance: The patient exercised for 11 minutes and 47 seconds,  achieving 13.4 METS. The maximum stage achieved was V of the Bruce  protocol. The baseline heart rate was 103 bpm. The heart rate at peak  stress was 187 bpm. The target heart rate  was calculated to be 158 bpm. The percentage of maximum predicted heart  rate achieved was 100.8 %. The baseline blood pressure was 152/90 mmHg.  The blood pressure at peak stress was 211/74 mmHg. The blood pressure  response was normal. The patient  developed fatigue during the stress exam. The patient's functional  capacity was excellent.    EKG: Resting EKG showed normal sinus rhythm with no abnormal findings. The  patient developed no abnormal EKG findings during exercise.    Zio monitor The patient wore the monitor for 6 days 22 hours starting September 12, 2020. Indication: Palpitations.  The minimum heart rate was 53 bpm, maximum heart rate  was 162  bpm, and average heart rate was 85 bpm. Predominant underlying rhythm was Sinus Rhythm.   Premature atrial complexes were rare less than 1%. Premature Ventricular complexes rare less than 1%.  No ventricular tachycardia, no pauses, No AV block, no supraventricular tachycardia and no atrial fibrillation present. 4 patient triggered events and 2 diary events noted: 1 associated with premature atrial complex, the remaining associated with sinus rhythm and sinus tachycardia.  Conclusion: Unremarkable study with no evidence of significant arrhythmia.    Labs/Other Tests and Data Reviewed:    EKG:    Recent Labs: 08/24/2020: ALT 22; BUN 17; Creatinine, Ser 0.78; Hemoglobin 13.2; Platelets 365.0; Potassium 4.0; Sodium 136; TSH 0.73   Recent Lipid Panel No results found for: CHOL, TRIG, HDL, CHOLHDL, LDLCALC, LDLDIRECT  Wt Readings from Last 3 Encounters:  12/30/20 99  lb 12.8 oz (45.3 kg)  11/25/20 102 lb 8 oz (46.5 kg)  09/22/20 101 lb 12.8 oz (46.2 kg)     Objective:    Vital Signs:  BP 126/84   Pulse 86   Ht 5\' 2"  (1.575 m)   Wt 99 lb 12.8 oz (45.3 kg)   BMI 18.25 kg/m    No physical exam virtual visit  ASSESSMENT & PLAN:    1. Postpartum hypertension 2. History of preeclampsia 3. Insulin-dependent diabetes 4. Postpartum depression  I am very happy for the patient she has improved significantly.  Her postpartum depression has really improved and she follows with behavioral health.  Her blood pressure today is acceptable.  We discussed a lot of counseling-I have asked the patient about taking her blood pressure regularly and notify if she sees 3 separate readings 130/80 mmHg as that would suggest getting back on antihypertensive medications.  I advised the patient that she is in a high risk group (African-American woman, postpartum hypertension, history of preeclampsia and diabetes) if she plans to get pregnant again.  I well continue to follow the patient closely especially given the fact that she desires to have another child.  Her testing results were also discussed again during this visit.  No questions.  I was able to answer all of her questions to her satisfaction.  COVID-19 Education: The signs and symptoms of COVID-19 were discussed with the patient and how to seek care for testing (follow up with PCP or arrange E-visit).  The importance of social distancing was discussed today.  Time:   Today, I have spent 15 minutes with the patient with telehealth technology discussing the above problems.     Medication Adjustments/Labs and Tests Ordered: Current medicines are reviewed at length with the patient today.  Concerns regarding medicines are outlined above.   Tests Ordered: No orders of the defined types were placed in this encounter.   Medication Changes: No orders of the defined types were placed in this  encounter.   Follow Up:  3 months  Signed, [place], DO  12/30/2020 2:01 PM    [facility]

## 2020-12-30 NOTE — Patient Instructions (Signed)
Medication Instructions:  Your physician recommends that you continue on your current medications as directed. Please refer to the Current Medication list given to you today.  *If you need a refill on your cardiac medications before your next appointment, please call your pharmacy*   Lab Work: None If you have labs (blood work) drawn today and your tests are completely normal, you will receive your results only by: Marland Kitchen MyChart Message (if you have MyChart) OR . A paper copy in the mail If you have any lab test that is abnormal or we need to change your treatment, we will call you to review the results.   Testing/Procedures: None   Follow-Up: At [facility], you and your health needs are our priority.  As part of our continuing mission to provide you with exceptional heart care, we have created designated Provider Care Teams.  These Care Teams include your primary Cardiologist (physician) and Advanced Practice Providers (APPs -  Physician Assistants and Nurse Practitioners) who all work together to provide you with the care you need, when you need it.  We recommend signing up for the patient portal called "MyChart".  Sign up information is provided on this After Visit Summary.  MyChart is used to connect with patients for Virtual Visits (Telemedicine).  Patients are able to view lab/test results, encounter notes, upcoming appointments, etc.  Non-urgent messages can be sent to your provider as well.   To learn more about what you can do with MyChart, go to [email or web address].    Your next appointment:   3 month(s)  The format for your next appointment:   In Person  Provider:   Thomasene Ripple, DO   Other Instructions  Monitor blood pressure regularly. If greater than 130/80 for 3 separate readings please call our office.

## 2021-01-04 ENCOUNTER — Telehealth: Payer: Self-pay | Admitting: Nutrition

## 2021-01-04 NOTE — Telephone Encounter (Signed)
LVM to call to schedule pump training on new Tandem pump

## 2021-01-05 ENCOUNTER — Ambulatory Visit: Payer: Managed Care, Other (non HMO) | Admitting: Allergy

## 2021-01-06 ENCOUNTER — Ambulatory Visit: Payer: Managed Care, Other (non HMO) | Admitting: Internal Medicine

## 2021-01-11 ENCOUNTER — Encounter: Payer: Managed Care, Other (non HMO) | Attending: Family Medicine | Admitting: Nutrition

## 2021-01-11 ENCOUNTER — Other Ambulatory Visit: Payer: Self-pay

## 2021-01-11 DIAGNOSIS — E1065 Type 1 diabetes mellitus with hyperglycemia: Secondary | ICD-10-CM | POA: Insufficient documentation

## 2021-01-11 DIAGNOSIS — E109 Type 1 diabetes mellitus without complications: Secondary | ICD-10-CM | POA: Insufficient documentation

## 2021-01-13 NOTE — Progress Notes (Unsigned)
Patient was seen as a video visit today. She said that she downloaded her Medtronic pump, but I was not able to view it.  Last upload viewable was 12/14.  She reports that her blood sugars are "all over the place", and she is afraid of dropping low and does not take what the pump says most of the time for meals or for correction doses.  She reports that her blood sugars go very high a lot of the time after eating--in the 300s, but then will drop low if she corrects, or if she does nothing.  She reports being afraid that sensor readings are not correct, because she is constantly not able to calibrate especially during the night and keeping her up, with no pump readings,  because sensor keeps saying " recalibrating", and she is afraid of dropping low when the sensors starts back working 1.Discussed the difference between sensor readings and blood sugar readings.  She was told not to calibrate sensor readings when arrows are pointing straight up or strait down due to the high difference between the two numbers.  Also, how to calibrate at [facility] closer to sensor reading when arrow angle is not level.   She agreed to do this.  2. Discussed need for balanced meal- to have protein-at least 7 grams with each meal to keep blood sugar from dropping 3-4 hrs pc meals.  She agreed to do this.  Also to stop eating cereal for breakfast.  No insulin/carb ratio can cover this rapid rise and quick drop from lack of protein/fat 3.  Discussed proper way to treat lows-with fast acting carbs only, then, to not wait until sensor reading has gone up before she stops treating.  Reminded her that blood sugar will rise, but it will take 20-30 min., before sensor reading registers the rise.  Stressed need to test blood sugar with finger stick 20 minutes after treating and to not retreat, unless IOB.  She reported good understanding of this.   4.  Decided that she does not feel safe/comfortable with settings.  Will show her the old fashion way  that basal rates are correct.  She will do basal testing for 3 days from 10PM until 6AM/8AM and I will call her in 3 days,  She was given directions for doing this.  She will test at MN, then at 6AM, eating nothing and then retest at 8AM.  If need be, we can correct and then move on to testing 8-12PM, etc. Once she feels confident that basal rate is correct, we will move on to carb ratios and correction factors.  She has agreed to do this, and we can hopefully get her to feel comfortable and safe with trusting her pump settings.

## 2021-01-14 ENCOUNTER — Encounter: Payer: Self-pay | Admitting: Nutrition

## 2021-01-14 NOTE — Patient Instructions (Signed)
1.  Do not eat high fat dinner.  Test blood sugar 4 hours after supper, eat not bedtime snack and test blood sugar at 6AM, and again at 8AM while remaining fasting.   2.  Do not calibrate sensor when blood sugar reading arrows are pointing straight up or down. 3.  At bedtime, if arrow is angled, and readings are more than 20 points different, retest blood sugar in 10 min.  If numbers vary by more than 20 points, calibrate with reading closer to sensor reading. 4.  Have protein with every meal.  This will add fat to slow the blood sugar rise, as well a prevent blood sugar from dropping low due to lack of glucose entering blood stream 2-3 hours after the meal. 5.  Stop cold cereal and milk

## 2021-01-17 ENCOUNTER — Encounter: Payer: Self-pay | Admitting: Allergy

## 2021-01-17 ENCOUNTER — Other Ambulatory Visit: Payer: Self-pay

## 2021-01-17 ENCOUNTER — Ambulatory Visit (INDEPENDENT_AMBULATORY_CARE_PROVIDER_SITE_OTHER): Payer: Managed Care, Other (non HMO) | Admitting: Allergy

## 2021-01-17 VITALS — BP 122/80 | HR 87 | Temp 97.3°F | Resp 18

## 2021-01-17 DIAGNOSIS — T781XXD Other adverse food reactions, not elsewhere classified, subsequent encounter: Secondary | ICD-10-CM

## 2021-01-17 DIAGNOSIS — J4540 Moderate persistent asthma, uncomplicated: Secondary | ICD-10-CM

## 2021-01-17 DIAGNOSIS — L503 Dermatographic urticaria: Secondary | ICD-10-CM | POA: Diagnosis not present

## 2021-01-17 DIAGNOSIS — J3089 Other allergic rhinitis: Secondary | ICD-10-CM

## 2021-01-17 DIAGNOSIS — J302 Other seasonal allergic rhinitis: Secondary | ICD-10-CM | POA: Diagnosis not present

## 2021-01-17 DIAGNOSIS — H1013 Acute atopic conjunctivitis, bilateral: Secondary | ICD-10-CM | POA: Diagnosis not present

## 2021-01-17 MED ORDER — BREO ELLIPTA 100-25 MCG/INH IN AEPB
1.0000 | INHALATION_SPRAY | Freq: Every day | RESPIRATORY_TRACT | 5 refills | Status: DC
Start: 2021-01-17 — End: 2021-05-30

## 2021-01-17 NOTE — Patient Instructions (Addendum)
Asthma:  . Breathing test normal today.  . Daily controller medication(s): continue Breo 1 puff once a day and rinse mouth after each use.  . Take Singulair 10mg  daily at night.  o See if this helps with the chest tightness. o See below for heartburn lifestyle and dietary modifications.  - If it's heartburn related, you can also try to take the famotidine 20mg  1-2 times a day.  o If no improvement after a few weeks let me know and will change your inhaler.  . May use albuterol rescue inhaler 2 puffs every 4 to 6 hours as needed for shortness of breath, chest tightness, coughing, and wheezing. May use albuterol rescue inhaler 2 puffs 5 to 15 minutes prior to strenuous physical activities. Monitor frequency of use.  . Asthma control goals:  o Full participation in all desired activities (may need albuterol before activity) o Albuterol use two times or less a week on average (not counting use with activity) o Cough interfering with sleep two times or less a month o Oral steroids no more than once a year o No hospitalizations  Environmental allergies:  Continue environmental control measures - grass, ragweed, weed, trees, molds, dust mites, cockroach, horse.  Consider allergy injections for long term control.  Had a detailed discussion with patient/family that clinical history is suggestive of allergic rhinitis, and may benefit from allergy immunotherapy (AIT). Discussed in detail regarding the dosing, schedule, side effects (mild to moderate local allergic reaction and rarely systemic allergic reactions including anaphylaxis), and benefits (significant improvement in nasal symptoms, seasonal flares of asthma) of immunotherapy with the patient. There is significant time commitment involved with allergy shots, which includes weekly immunotherapy injections for first 9-12 months and then biweekly to monthly injections for 3-5 years.   Let me know when ready to start.   Continue with Zyrtec  (cetirizine) 10mg  twice a day.  Food allergies:  Continue to avoid sesame, peanuts and tree nuts.  For mild symptoms you can take over the counter antihistamines such as Benadryl and monitor symptoms closely. If symptoms worsen or if you have severe symptoms including breathing issues, throat closure, significant swelling, whole body hives, severe diarrhea and vomiting, lightheadedness then inject epinephrine and seek immediate medical care afterwards.  Food action plan in place.   Consider food challenge to peanuts - must be off zyrtec for 3 days and Pepcid for 2 days.  If you can't come off the medications then will do it at a later time.   Skin   Continue proper skin care.  Continue with Zyrtec (cetirizine) 10mg  twice a day.  Follow up in 4 months or sooner if needed.  Reducing Pollen Exposure . Pollen seasons: trees (spring), grass (summer) and ragweed/weeds (fall). Keep windows closed in your home and car to lower pollen exposure.  air conditioning in the bedroom and throughout the house if possible.  . Avoid going out in dry windy days - especially early morning. . Pollen counts are highest between 5 - 10 AM and on dry, hot and windy days.  . Save outside activities for late afternoon or after a heavy rain, when pollen levels are lower.  . Avoid mowing of grass if you have grass pollen allergy. 12-15-1982 Be aware that pollen can also be transported indoors on people and pets.  . Dry your clothes in an automatic dryer rather than hanging them outside where they might collect pollen.  . Rinse hair and eyes before bedtime. Control  of House Dust Mite Allergen . Dust mite allergens are a common trigger of allergy and asthma symptoms. While they can be found throughout the house, these microscopic creatures thrive in warm, humid environments such as bedding, upholstered furniture and carpeting. . Because so much time is spent in the bedroom, it is essential to reduce mite  levels there.  . Encase pillows, mattresses, and box springs in special allergen-proof fabric covers or airtight, zippered plastic covers.  . Bedding should be washed weekly in hot water (130 F) and dried in a hot dryer. Allergen-proof covers are available for comforters and pillows that can't be regularly washed.  Nicole Carrillo the allergy-proof covers every few months. Minimize clutter in the bedroom. Keep pets out of the bedroom.  Marland Kitchen Keep humidity less than 50% by using a dehumidifier or air conditioning. You can buy a humidity measuring device called a hygrometer to monitor this.  . If possible, replace carpets with hardwood, linoleum, or washable area rugs. If that's not possible, vacuum frequently with a vacuum that has a HEPA filter. . Remove all upholstered furniture and non-washable window drapes from the bedroom. . Remove all non-washable stuffed toys from the bedroom.  Wash stuffed toys weekly. Mold Control . Mold and fungi can grow on a variety of surfaces provided certain temperature and moisture conditions exist.  . Outdoor molds grow on plants, decaying vegetation and soil. The major outdoor mold, Alternaria and Cladosporium, are found in very high numbers during hot and dry conditions. Generally, a late summer - fall peak is seen for common outdoor fungal spores. Rain will temporarily lower outdoor mold spore count, but counts rise rapidly when the rainy period ends. . The most important indoor molds are Aspergillus and Penicillium. Dark, humid and poorly ventilated basements are ideal sites for mold growth. The next most common sites of mold growth are the bathroom and the kitchen. Outdoor (Seasonal) Mold Control . Use air conditioning and keep windows closed. . Avoid exposure to decaying vegetation. Marland Kitchen Avoid leaf raking. . Avoid grain handling. . Consider wearing a face mask if working in moldy areas.  Indoor (Perennial) Mold Control  . Maintain humidity below 50%. . Get rid of mold  growth on hard surfaces with water, detergent and, if necessary, 5% bleach (do not mix with other cleaners). Then dry the area completely. If mold covers an area more than 10 square feet, consider hiring an indoor environmental professional. . For clothing, washing with soap and water is best. If moldy items cannot be cleaned and dried, throw them away. . Remove sources e.g. contaminated carpets. . Repair and seal leaking roofs or pipes. Using dehumidifiers in damp basements may be helpful, but empty the water and clean units regularly to prevent mildew from forming. All rooms, especially basements, bathrooms and kitchens, require ventilation and cleaning to deter mold and mildew growth. Avoid carpeting on concrete or damp floors, and storing items in damp areas. Cockroach Allergen Avoidance Cockroaches are often found in the homes of densely populated urban areas, schools or commercial buildings, but these creatures can lurk almost anywhere. This does not mean that you have a dirty house or living area. . Block all areas where roaches can enter the home. This includes crevices, wall cracks and windows.  . Cockroaches need water to survive, so fix and seal all leaky faucets and pipes. Have an exterminator go through the house when your family and pets are gone to eliminate any remaining roaches. Marland Kitchen Keep food in lidded  containers and put pet food dishes away after your pets are done eating. Vacuum and sweep the floor after meals, and take out garbage and recyclables. Use lidded garbage containers in the kitchen. Wash dishes immediately after use and clean under stoves, refrigerators or toasters where crumbs can accumulate. Wipe off the stove and other kitchen surfaces and cupboards regularly.   Skin care recommendations  Bath time: . Always use lukewarm water. AVOID very hot or cold water. Marland Kitchen Keep bathing time to 5-10 minutes. . Do NOT use bubble bath. . Use a mild soap and use just enough to wash the  dirty areas. . Do NOT scrub skin vigorously.  . After bathing, pat dry your skin with a towel. Do NOT rub or scrub the skin.  Moisturizers and prescriptions:  . ALWAYS apply moisturizers immediately after bathing (within 3 minutes). This helps to lock-in moisture. . Use the moisturizer several times a day over the whole body. [place] summer moisturizers include: Aveeno, CeraVe, Cetaphil. [place] winter moisturizers include: Aquaphor, Vaseline, Cerave, Cetaphil, Eucerin, Vanicream. . When using moisturizers along with medications, the moisturizer should be applied about one hour after applying the medication to prevent diluting effect of the medication or moisturize around where you applied the medications. When not using medications, the moisturizer can be continued twice daily as maintenance.  Laundry and clothing: . Avoid laundry products with added color or perfumes. . Use unscented hypo-allergenic laundry products such as Tide free, Cheer free & gentle, and All free and clear.  . If the skin still seems dry or sensitive, you can try double-rinsing the clothes. . Avoid tight or scratchy clothing such as wool. . Do not use fabric softeners or dyer sheets.  Heartburn Heartburn is a type of pain or discomfort that can happen in your throat or chest. It is often described as a burning pain. It may also cause a bad, acid-like taste in your mouth. It may be caused by stomach contents that move back up (reflux) into the part of the body that moves food from your mouth to your stomach (esophagus). Heartburn may feel worse:  When you lie down.  When you bend over.  At night. Follow these instructions at home: Eating and drinking  Avoid certain foods and drinks as told by your doctor. This may include: ? Coffee and tea, with or without caffeine. ? Drinks that have alcohol. ? Energy drinks and sports drinks. ? Carbonated drinks or sodas. ? Chocolate and cocoa. ? Peppermint and mint  flavorings. ? Garlic and onions. ? Horseradish. ? Spicy and acidic foods, such as:  Peppers.  Chili powder and curry powder.  Vinegar.  Hot sauces and BBQ sauce. ? Citrus fruit juices and citrus fruits, such as:  Oranges.  Lemons.  Limes. ? Tomato-based foods, such as:  Red sauce and pizza with red sauce.  Chili.  Salsa. ? Fried and fatty foods, such as:  Donuts.  [place] fries and potato chips.  High-fat dressings. ? High-fat meats, such as:  Hot dogs and sausage.  Rib eye steak.  Ham and bacon. ? High-fat dairy items, such as:  Whole milk.  Butter.  Cream cheese.  Eat small meals often. Avoid eating large meals.  Avoid drinking large amounts of liquid with your meals.  Avoid eating meals during the 2-3 hours before bedtime.  Avoid lying down right after you eat.  Do not exercise right after you eat.   Lifestyle  If you are overweight, lose an amount  of weight that is healthy for you. Ask your doctor about a safe weight loss goal.  Do not smoke or use any products that contain nicotine or tobacco. These can make your symptoms worse. If you need help quitting, ask your doctor.  Wear loose clothes. Do not wear anything tight around your waist.  Raise (elevate) the head of your bed about 6 inches (15 cm) when you sleep. You can use a wedge to do this.  Try to lower your stress. If you need help doing this, ask your doctor.      Medicines  Take over-the-counter and prescription medicines only as told by your doctor.  Do not take aspirin or NSAIDs, such as ibuprofen, unless your doctor says it is okay.  Stop medicines only as told by your doctor. If you stop taking some medicines too quickly, your symptoms may get worse. General instructions  Watch for any changes in your symptoms.  Keep all follow-up visits. Contact a doctor if:  You have new symptoms.  You lose weight and you do not know why.  You have trouble swallowing, or it  hurts to swallow.  You have wheezing or a cough that keeps happening.  Your symptoms do not get better with treatment.  You have heartburn often for more than 2 weeks. Get help right away if:  You have pain in your arms, neck, jaw, teeth, or back all of a sudden.  You feel sweaty, dizzy, or light-headed all of a sudden.  You have chest pain or shortness of breath.  You vomit and your vomit looks like blood or coffee grounds.  Your poop (stool) is bloody or black. These symptoms may be an emergency. Get help right away. Call your local emergency services (911 in the [place]  Do not wait to see if the symptoms will go away.  Do not drive yourself to the hospital. Summary  Heartburn is a type of pain that can happen in your throat or chest. It can feel like a burning pain. It may also cause a bad, acid-like taste in your mouth.  You may need to avoid certain foods and drinks to help your symptoms. Ask your doctor what foods and drinks you should avoid.  Take over-the-counter and prescription medicines only as told by your doctor. Do not take aspirin or NSAIDs, such as ibuprofen, unless your doctor told you to do so.  Contact your doctor if your symptoms do not get better or they get worse. This information is not intended to replace advice given to you by your health care provider. Make sure you discuss any questions you have with your health care provider. Document Revised: 06/01/2020 Document Reviewed: 06/01/2020 Elsevier Patient Education  2021 [employer].

## 2021-01-17 NOTE — Progress Notes (Signed)
Follow Up Note  RE: Nicole Carrillo MRN: [id number] DOB: [birth year] Date of Office Visit: 01/17/2021  Referring provider: Natalia Leatherwood, DO Primary care provider: Natalia Leatherwood, DO  Chief Complaint: Asthma  History of Present Illness: I had the pleasure of seeing Nicole Carrillo for a follow up visit at the [facility] on 01/17/2021. She is a 35 y.o. female, who is being followed for asthma, allergic rhinoconjunctivitis, adverse food reaction and dermatographism. Her previous allergy office visit was on 09/22/2020 with Dr. Selena Batten. Today is a regular follow up visit.  Asthma ACT score 18.  Patient was doing really well up until 3 weeks ago but some mornings waking up with some mild discomfort. This improves after using Breo and then around the afternoon gets some similar discomfort. She tried albuterol and it took about 30 minutes for symptom to resolve Not sure about reflux but she has been drinking caffeine daily.  No increased stress.   Currently takes Breo 1 puff once a day and Singulair daily in the morning with good benefit.  Denies any ER/urgent care visits or prednisone use since the last visit.  Seasonal and perennial allergic rhinoconjunctivitis Currently taking zyrtec 10mg  twice a day. If she forgets to take second dose notices some chest discomfort and throat itching. Patient may be interested in doing allergy injections but concerned about side effects and reactions.   Adverse food reaction Currently avoiding sesame, peanuts, tree nuts. No known reactions or exposure.   Dermatographism Did not start famotidine as skin issues has been controlled.   Assessment and Plan: Nicole Carrillo is a 35 y.o. female with: Asthma Past history - Diagnosed with asthma in 2018 by pulmonology via  Methacholine challenge test - data not available for review during OV. Tried Arnuity, Breo with no benefit. No triggers noted. Resolved during pregnancy. Tried Pepcid for  few months with no benefit. Normal EGD in past per patient report. 2021 spirometry was normal with marginal improvement in FEV1 post bronchodilator treatment. Clinically feeling about the same. Interim history - was doing well until 3 weeks ago. Noted some chest tightness in the mornings and in the afternoons. Not sure if albuterol is helping. Drinking daily caffeine. No increased stress.   Today's spirometry was normal.  ACT score 18.  . Daily controller medication(s): continue Breo 2022 1 puff once a day and rinse mouth after each use.  . Take Singulair 10mg  daily at night.  o See if this helps with the chest tightness. o See below for heartburn lifestyle and dietary modifications.  - If it's heartburn related, try famotidine 20mg  1-2 times a day.  o If no improvement after a few weeks let me know and will change inhaler.  . May use albuterol rescue inhaler 2 puffs every 4 to 6 hours as needed for shortness of breath, chest tightness, coughing, and wheezing. May use albuterol rescue inhaler 2 puffs 5 to 15 minutes prior to strenuous physical activities. Monitor frequency of use.  . Repeat spirometry at next visit.  Seasonal and perennial allergic rhinoconjunctivitis Past history - Perennial rhino conjunctivitis symptoms for the past 3 years. 2018 skin testing by ENT was positive to grass, ragweed, weed, trees, molds, dust mites, cockroach, horse. SLIT therapy by ENT x few months with no benefit. Deviated septum repair and turbinate reduction in 2019. 2021 bloodwork positive to dust mites, grass pollen, mold, tree pollen, ragweed pollen and weed pollen. Dymista caused nausea.  Interim history - stable, still  concerned about reactions to AIT.  Continue environmental control measures.   Consider allergy injections for long term control.  Had a detailed discussion with patient/family that clinical history is suggestive of allergic rhinitis, and may benefit from allergy immunotherapy (AIT).  Discussed in detail regarding the dosing, schedule, side effects (mild to moderate local allergic reaction and rarely systemic allergic reactions including anaphylaxis), and benefits (significant improvement in nasal symptoms, seasonal flares of asthma) of immunotherapy with the patient. There is significant time commitment involved with allergy shots, which includes weekly immunotherapy injections for first 9-12 months and then biweekly to monthly injections for 3-5 years.   Let me know when ready to start - will start on silver vial and schedule A.  Continue with Zyrtec (cetirizine) 10mg  twice a day.  Adverse food reaction Past history - 2018 skin testing positive to soy, peanut and borderline to wheat. She had no prior clinical reactions to these foods. 2021 bloodwork borderline positive to hazelnut, peanut, macademia nut, pistachios and soy. Positive to sesame seed. Tolerates soy and wheat. Interim history - no reactions.   Continue to avoid sesame, peanuts and tree nuts.  For mild symptoms you can take over the counter antihistamines such as Benadryl and monitor symptoms closely. If symptoms worsen or if you have severe symptoms including breathing issues, throat closure, significant swelling, whole body hives, severe diarrhea and vomiting, lightheadedness then inject epinephrine and seek immediate medical care afterwards.  Food action plan in place.   Consider food challenge to peanuts - must be off zyrtec for 3 days and Pepcid for 2 days.  If you can't come off the medications then will do it at a later time.   Dermatographism Stable with zyrtec. Did not try Pepcid.   Continue proper skin care.  Continue with Zyrtec (cetirizine) 10mg  twice a day.  Return in about 4 months (around 05/17/2021).  Meds ordered this encounter  Medications  . fluticasone furoate-vilanterol (BREO ELLIPTA) 100-25 MCG/INH AEPB    Sig: Inhale 1 puff into the lungs daily. Rinse mouth after each use.     Dispense:  60 each    Refill:  5    Please place on file until patient ready for pick up.     Diagnostics: Spirometry:  Tracings reviewed. Her effort: Good reproducible efforts. FVC: 2.80L FEV1: 2.76L, 2.47% predicted FEV1/FVC ratio: 99% Interpretation: Spirometry consistent with normal pattern.  Please see scanned spirometry results for details.  Medication List:  Current Outpatient Medications  Medication Sig Dispense Refill  . albuterol (VENTOLIN HFA) 108 (90 Base) MCG/ACT inhaler Inhale 2 puffs into the lungs every 4 (four) hours as needed for wheezing or shortness of breath. 18 g 2  . cetirizine (ZYRTEC) 10 MG tablet Take 10 mg by mouth 2 (two) times daily.     EPINEPHrine (AUVI-Q) 0.3 mg/0.3 mL IJ SOAJ injection Inject 0.3 mLs (0.3 mg total) into the muscle as needed for anaphylaxis. 1 each 2  . Glucagon 1 MG/0.2ML SOAJ Inject 1 mg into the skin as directed. 0.2 mL 1  . insulin lispro (HUMALOG) 100 UNIT/ML injection Use 40 max units daily in insulin pump 30 mL 2  . Microlet Lancets MISC     . montelukast (SINGULAIR) 10 MG tablet Take 1 tablet (10 mg total) by mouth daily. 90 tablet 1  . NON FORMULARY Continuous glucose monitor    . Prenatal Vit-Fe Fumarate-FA (PRENATAL MULTIVITAMIN) TABS tablet Take 1 tablet by mouth daily at 12 noon.    07/17/2021  fluticasone furoate-vilanterol (BREO ELLIPTA) 100-25 MCG/INH AEPB Inhale 1 puff into the lungs daily. Rinse mouth after each use. 60 each 5   No current facility-administered medications for this visit.   Allergies: Allergies  Allergen Reactions  . Shellfish Allergy Shortness Of Breath and Rash    Crab causes SOB, Scallops cause rash.  Can eat Shrimp and lobster.  . Acetaminophen Other (See Comments)    Effects blood sugars and insulin pump.  . Cephalexin Nausea Only and Other (See Comments)    Headaches, dizziness  . Pneumococcal Vaccines Swelling    Local injection swelling  . Prednisone Nausea Only and Other (See Comments)     Headaches, dizziness, Prefers not to take because of uncontrollable BG's on this med;     Marland Kitchen Soy Allergy Other (See Comments)    Blood test  . Adhesive [Tape] Rash    With extended exposure  . Peanut-Containing Drug Products Rash   I reviewed her past medical history, social history, family history, and environmental history and no significant changes have been reported from her previous visit.  Review of Systems  Constitutional: Negative for appetite change, chills, fever and unexpected weight change.  HENT: Negative for congestion and rhinorrhea.   Eyes: Negative for itching.  Respiratory: Positive for chest tightness. Negative for cough, shortness of breath and wheezing.   Cardiovascular: Negative for chest pain.  Gastrointestinal: Negative for abdominal pain.  Genitourinary: Negative for difficulty urinating.  Skin: Negative for rash.  Allergic/Immunologic: Positive for environmental allergies.  Neurological: Negative for headaches.   Objective: BP 122/80 (BP Location: Left Arm, Patient Position: Sitting, Cuff Size: Normal)   Pulse 87   Temp (!) 97.3 F (36.3 C) (Temporal)   Resp 18   SpO2 98%  There is no height or weight on file to calculate BMI. Physical Exam Vitals and nursing note reviewed.  Constitutional:      Appearance: Normal appearance. She is well-developed.  HENT:     Head: Normocephalic and atraumatic.     Right Ear: Tympanic membrane and external ear normal.     Left Ear: Tympanic membrane and external ear normal.     Nose: Nose normal.     Mouth/Throat:     Mouth: Mucous membranes are moist.     Pharynx: Oropharynx is clear.  Eyes:     Conjunctiva/sclera: Conjunctivae normal.  Cardiovascular:     Rate and Rhythm: Normal rate and regular rhythm.     Heart sounds: Normal heart sounds. No murmur heard.   Pulmonary:     Effort: Pulmonary effort is normal.     Breath sounds: Normal breath sounds. No wheezing, rhonchi or rales.  Musculoskeletal:      Cervical back: Neck supple.  Skin:    General: Skin is warm.     Findings: No rash.  Neurological:     Mental Status: She is alert and oriented to person, place, and time.  Psychiatric:        Mood and Affect: Mood normal.        Behavior: Behavior normal.    Previous notes and tests were reviewed. The plan was reviewed with the patient/family, and all questions/concerned were addressed.  It was my pleasure to see Nicole Carrillo today and participate in her care. Please feel free to contact me with any questions or concerns.  Sincerely,  Wyline Mood, DO Allergy & Immunology  [facility] office: [phone] [facility] office: [phone]

## 2021-01-17 NOTE — Assessment & Plan Note (Signed)
Past history - Perennial rhino conjunctivitis symptoms for the past 3 years. 2018 skin testing by ENT was positive to grass, ragweed, weed, trees, molds, dust mites, cockroach, horse. SLIT therapy by ENT x few months with no benefit. Deviated septum repair and turbinate reduction in 2019. 2021 bloodwork positive to dust mites, grass pollen, mold, tree pollen, ragweed pollen and weed pollen. Dymista caused nausea.  Interim history - stable, still concerned about reactions to AIT.  Continue environmental control measures.   Consider allergy injections for long term control.  Had a detailed discussion with patient/family that clinical history is suggestive of allergic rhinitis, and may benefit from allergy immunotherapy (AIT). Discussed in detail regarding the dosing, schedule, side effects (mild to moderate local allergic reaction and rarely systemic allergic reactions including anaphylaxis), and benefits (significant improvement in nasal symptoms, seasonal flares of asthma) of immunotherapy with the patient. There is significant time commitment involved with allergy shots, which includes weekly immunotherapy injections for first 9-12 months and then biweekly to monthly injections for 3-5 years.   Let me know when ready to start - will start on silver vial and schedule A.  Continue with Zyrtec (cetirizine) 10mg  twice a day.

## 2021-01-17 NOTE — Assessment & Plan Note (Signed)
Past history - 2018 skin testing positive to soy, peanut and borderline to wheat. She had no prior clinical reactions to these foods. 2021 bloodwork borderline positive to hazelnut, peanut, macademia nut, pistachios and soy. Positive to sesame seed. Tolerates soy and wheat. Interim history - no reactions.   Continue to avoid sesame, peanuts and tree nuts.  For mild symptoms you can take over the counter antihistamines such as Benadryl and monitor symptoms closely. If symptoms worsen or if you have severe symptoms including breathing issues, throat closure, significant swelling, whole body hives, severe diarrhea and vomiting, lightheadedness then inject epinephrine and seek immediate medical care afterwards.  Food action plan in place.   Consider food challenge to peanuts - must be off zyrtec for 3 days and Pepcid for 2 days.  If you can't come off the medications then will do it at a later time.

## 2021-01-17 NOTE — Assessment & Plan Note (Signed)
Stable with zyrtec. Did not try Pepcid.   Continue proper skin care.  Continue with Zyrtec (cetirizine) 10mg  twice a day.

## 2021-01-17 NOTE — Assessment & Plan Note (Signed)
Past history - Diagnosed with asthma in 2018 by pulmonology via  Methacholine challenge test - data not available for review during OV. Tried Arnuity, Breo with no benefit. No triggers noted. Resolved during pregnancy. Tried Pepcid for few months with no benefit. Normal EGD in past per patient report. 2021 spirometry was normal with marginal improvement in FEV1 post bronchodilator treatment. Clinically feeling about the same. Interim history - was doing well until 3 weeks ago. Noted some chest tightness in the mornings and in the afternoons. Not sure if albuterol is helping. Drinking daily caffeine. No increased stress.   Today's spirometry was normal.  ACT score 18.  . Daily controller medication(s): continue Breo 1 puff once a day and rinse mouth after each use.  . Take Singulair 10mg  daily at night.  o See if this helps with the chest tightness. o See below for heartburn lifestyle and dietary modifications.  - If it's heartburn related, try famotidine 20mg  1-2 times a day.  o If no improvement after a few weeks let me know and will change inhaler.  . May use albuterol rescue inhaler 2 puffs every 4 to 6 hours as needed for shortness of breath, chest tightness, coughing, and wheezing. May use albuterol rescue inhaler 2 puffs 5 to 15 minutes prior to strenuous physical activities. Monitor frequency of use.  . Repeat spirometry at next visit.

## 2021-01-18 ENCOUNTER — Telehealth: Payer: Self-pay | Admitting: Nutrition

## 2021-01-18 NOTE — Telephone Encounter (Signed)
Patient reports that she did not receive my e-mail from our care link account.  Another one sent.  She reports having gotten this one, and will do the steps necessary to link to our practice, so that Dr. Lonzo Cloud can view the download.

## 2021-01-18 NOTE — Telephone Encounter (Signed)
Patient reported that she has had 2 lows during the night after make a correction dose at MN, despite taking 1.5u less that the pump told her to take.  She says she is not able to tell of basal rate is correct due to lows. She tried to upload her pump to  [employer] care link, but I was still not able to view any uploads since 12/15.  Correction dose changed to 75 from MN to 7AM and continues to be 60 during the day.  She was told that, when I get back to work on Monday, I will send her an e-mail from our carelink account with directions to link to our practice.

## 2021-01-19 ENCOUNTER — Ambulatory Visit (HOSPITAL_COMMUNITY): Payer: 59 | Admitting: Licensed Clinical Social Worker

## 2021-01-31 ENCOUNTER — Ambulatory Visit (INDEPENDENT_AMBULATORY_CARE_PROVIDER_SITE_OTHER): Payer: 59 | Admitting: Licensed Clinical Social Worker

## 2021-01-31 DIAGNOSIS — F419 Anxiety disorder, unspecified: Secondary | ICD-10-CM

## 2021-01-31 NOTE — Progress Notes (Signed)
Virtual Visit via Video Note  I connected with Nicole Carrillo on 01/31/21 at 11:00 AM EST by a video enabled telemedicine application and verified that I am speaking with the correct person using two identifiers.  Location: Patient: home Provider: home office   I discussed the limitations of evaluation and management by telemedicine and the availability of in person appointments. The patient expressed understanding and agreed to proceed.   I discussed the assessment and treatment plan with the patient. The patient was provided an opportunity to ask questions and all were answered. The patient agreed with the plan and demonstrated an understanding of the instructions.   The patient was advised to call back or seek an in-person evaluation if the symptoms worsen or if the condition fails to improve as anticipated.  I provided 56 minutes of non-face-to-face time during this encounter.  THERAPIST PROGRESS NOTE  Session Time: 11:00 AM to 11:56 AM  Participation Level: Active  Behavioral Response: CasualAlertAnxious  Type of Therapy: Individual Therapy  Treatment Goals addressed:  Anxiety, coping  Interventions: CBT, Solution Focused, Strength-based and Other: coping  Summary: Nicole Carrillo is a 35 y.o. female who presents with doesn't feel as stressed preparing the bottles to get done, if it gets done it gets done, prepared things at night, husband on board when prepping. Stress the last think reach for self-compassion. What is the point it has to get done is her opinion.  Therapist noted she is practicing self compassion in the way she talks herself so where she can is helpful such as telling herself it gets done when he gets done.  In a moment of stress not her priority but compassion to herself will read her book instead of doing her school work. Help her calm down and like it. Her taking care of her. At the same time has to be done when done or more stressed and more behind. Let up  doesn't have to be a certain time. When have time and not stressed doing it go ahead and fill the bottles. Anxiety is episodic with what is going on. DMP project especially with school has a big paper, defend her proposal good project and excited about it but has to get done. Proud worked on it when husband here, and wrote a third of paper. Felt bad didn't work on project yesterday and days don't get as much done as want to therapist normalized.  Talked about things particularly stressing her and processed her feelings about them including having another child, baptism, concerns about Covid, having a nanny.  Therapist noted patient can always find something negative noted if she stays in her head too long becomes more roadblocks.  Encourage patient with her insight to just do it and live life and the anxiety lessens therapist added after thinking about it for a while what seems reasonable.  Decided be a good idea to call pediatrician to get professional advice about COVID.  Therapist reviewed symptoms, facilitated expression of thoughts and feelings, continue to encourage patient to let go a little and figure out what is reasonable.  Noted when she is able to do that by telling herself it will get done when it gets done has been helpful.  Noted this is a form of self compassion also noted good coping strategy when she gets overwhelmed to read to help decrease stress noted this is a form of self-care.  Continue to work on control on what is reasonable.  Worked on particular issues that are stressful  for her including having another child.  Looking at whether she would able to continue her program, pointed out other people in program do not have the right to make decisions for her and noted they have been supportive in the past.  What matters is whether patient feels she can handle that.  Getting the nanny and concerns over this noted patient can stay in her head and think about every possible negative consequence.   Discussed life being about the unknown and people who manage like best live comfortably with the unknown knowing risk is low and applied this to situation with nanny but also using her own interviewing to find a person she feels comfortable with.  Noted is well will be a lot of time with her child.  Other scenarios that put her child at risk came up with any nanny and patient herself came up with her own best advice to just do things and anxiety will lessen reinforce the strategy that she can talk her self and to worries again getting out of her head as is holding her back at a certain point.  Reviewed upcoming baptism and her concerns and reviewed what would be reasonable choice for her.  It can eliminate all risk already have to do that in her life with husband is a pilot same time therapist saying some precautions are reasonable.  Decided to contact pediatrician would be helpful, also encourage patient in view of not wanting to keep her child at home all the time needing to be out and experiencing life is also healthy.  Therapist provided active listening, open questions, supportive interventions.  Therapist provided basic education about anxiety has its fighter flight and then her thoughts and behaviors keep the cycle going, and often misperceiving something is dangerous. Suicidal/Homicidal: No  Plan: Return again in 2 weeks.2.Process feelings related to stressors to help with coping, work on coping of anxiety in general. 3.Find something on self-compassion  Diagnosis: Axis I:  Anxiety Disorder unspecified    Axis II: No diagnosis    Nicole Breeze, LCSW 01/31/2021

## 2021-02-01 NOTE — Telephone Encounter (Signed)
Patient still not able to download carelink.  I have given the [employer] rep.'s number to call to help with this, so that we can view her downloads from our office.  Kathi Simpers was also asked to view the download and discuss findings with patient ie: not bolusing before meals, and how to help the auto mode to work better.  Pt. Agreed to call me back after speaking with her.

## 2021-02-11 ENCOUNTER — Other Ambulatory Visit: Payer: Self-pay | Admitting: Allergy

## 2021-02-14 ENCOUNTER — Ambulatory Visit (INDEPENDENT_AMBULATORY_CARE_PROVIDER_SITE_OTHER): Payer: 59 | Admitting: Licensed Clinical Social Worker

## 2021-02-14 DIAGNOSIS — F419 Anxiety disorder, unspecified: Secondary | ICD-10-CM

## 2021-02-14 NOTE — Progress Notes (Signed)
Virtual Visit via Video Note  I connected with Nicole Carrillo on 02/14/21 at 11:00 AM EST by a video enabled telemedicine application and verified that I am speaking with the correct person using two identifiers.  Location: Patient: home  Provider: home office   I discussed the limitations of evaluation and management by telemedicine and the availability of in person appointments. The patient expressed understanding and agreed to proceed.    I discussed the assessment and treatment plan with the patient. The patient was provided an opportunity to ask questions and all were answered. The patient agreed with the plan and demonstrated an understanding of the instructions.   The patient was advised to call back or seek an in-person evaluation if the symptoms worsen or if the condition fails to improve as anticipated.  I provided 54 minutes of non-face-to-face time during this encounter.  THERAPIST PROGRESS NOTE  Session Time: 11:00 AM to 11:54 AM  Participation Level: Active  Behavioral Response: CasualAlertAnxious and Euthymic  Type of Therapy: Individual Therapy  Treatment Goals addressed:  Anxiety, coping Interventions: Solution Focused, Strength-based, Supportive and Other: coping-letting go and mindfulness  Summary: Nicole Carrillo is a 35 y.o. female who presents with went to Baptism, didn't wear masks or  gloves nice to have a normal experience, careful but didn't avoid anything. Had to let it go in situations. Nice to see parents they haven't seen Enid Nicole except one other time. Was around more people, but careful as far as who was holding him. Reviewed what helped her to get to that decision and she says a little bit of everything, she was ready to make changes and not be so restricted in her activities, she can't keep her son sheltered and wants him to experience things in life that a little boy should experience. Husband lead in the direction. Not completely sheltered anyway.  Wanted to see his parents. Noted she had to trust a little bit. "Let it go and it was good." and still monitoring. Shares her feeling of relief can go back to doing things she enjoys and not worry so much, still be cautious. Enjoyed going out maybe ok go to dinner for their anniversary. Wants to expose her son to the world and has different experiences.  Reviewed choices around having a nanny things may work out that her sister-in-law may be available to watch her son and likes this because he can be raised with his cousin.  Patient has some concerns reviewed looking at the pros and cons helpful as there are many advantages for her watching her son.  th his cousin. Doing better and ok. Reviewed if need to continue not sure if talking is one of the reasons doing better but is doing better. March and April stressful has school has to defend [employer] (doctorate for nursing) has three papers to write, anniversary.  Her best reinforced message to get out of her head and do the best she can.  Patient reinforced remembering the end goal  and therapist added reminding herself to use her resiliency skills. Discussed support from relationship and patient appreciates it but has been an issue that they don't see eye to eye as far COVID. They have found a rhythm.  Therapist introduced skill of mindfulness that can be used for anxiety and patient has some knowledge.  Patient feels she has started practicing it without really being able to label what is is has been taking steps in this direction and good to know what it is.  Can ice is something you have to practice.  Therapist reviewed symptoms, facilitated expression of thoughts and feelings and noted patient letting go had positive results will encourage her to continue to let go more.  Holding onto certain needs and and possible standard there will have to be some uncertainty but having a little trust that things will be okay.  Realizes its necessary is all this concern has held  her back and needs to enjoy her life more also better for her son to spend more time with family do experience things as a little boy.  In terms of upcoming stressors round Covid helpful to look at the pros and cons to make her choice for example having her sister-in-law come babysit noticing all the positives that can come from that situation.  Patient notes as well as she is exposed to some risk now so really cannot avoid the risk completely.  Therapist introduced skill of mindfulness the elements including being not judgmental, being observational how the key is to practice mindfulness and I will help her get better at noticing thoughts and feelings and with this observation she has choices of how she wants to manage.  Noted how there are phrases she can use such as "O there is this thought again of uncertainty" noticing the thought and then being able to not attach and let it go.  Therapist assesses good progress for patient from significant step up being able to let go more needs to continue to work on strengthening that skill.  Therapist provided active listening, open questions, supportive interventions.  Work with patient on thoughts of having to get everything right noted this as an impossible standard and being compassionate with herself saying things like oh I will catch it next time as part of being human we inevitably will make a mistake.  Also the subjective nature of sessions as patient pointed out leads a certain direction that plays a part in what we explore not totally in the control of the provider.   Suicidal/Homicidal: No  Plan: Return again in 5 weeks.2.  Extend out session as patient is doing okay continuing education on coping skills by [facility] more on uncertainty, also introducing book "take control of anxiety", continue to work with patient on coping, encouraging patient with positive step of letting go more.  Diagnosis: Axis I:  Anxiety Disorder unspecified    Axis II: No diagnosis    [place]idge Breeze, LCSW 02/14/2021

## 2021-03-07 ENCOUNTER — Encounter: Payer: Self-pay | Admitting: Family Medicine

## 2021-03-07 ENCOUNTER — Ambulatory Visit (INDEPENDENT_AMBULATORY_CARE_PROVIDER_SITE_OTHER): Payer: Managed Care, Other (non HMO) | Admitting: Family Medicine

## 2021-03-07 ENCOUNTER — Other Ambulatory Visit: Payer: Self-pay

## 2021-03-07 VITALS — BP 110/70 | HR 70 | Temp 98.2°F | Ht 62.0 in | Wt 102.0 lb

## 2021-03-07 DIAGNOSIS — N6320 Unspecified lump in the left breast, unspecified quadrant: Secondary | ICD-10-CM | POA: Diagnosis not present

## 2021-03-07 DIAGNOSIS — R590 Localized enlarged lymph nodes: Secondary | ICD-10-CM | POA: Diagnosis not present

## 2021-03-07 LAB — CBC WITH DIFFERENTIAL/PLATELET
Basophils Absolute: 0 10*3/uL (ref 0.0–0.1)
Basophils Relative: 0.5 % (ref 0.0–3.0)
Eosinophils Absolute: 0 10*3/uL (ref 0.0–0.7)
Eosinophils Relative: 0.7 % (ref 0.0–5.0)
HCT: 39.7 % (ref 36.0–46.0)
Hemoglobin: 13.5 g/dL (ref 12.0–15.0)
Lymphocytes Relative: 28.2 % (ref 12.0–46.0)
Lymphs Abs: 1.3 10*3/uL (ref 0.7–4.0)
MCHC: 34 g/dL (ref 30.0–36.0)
MCV: 89.2 fl (ref 78.0–100.0)
Monocytes Absolute: 0.3 10*3/uL (ref 0.1–1.0)
Monocytes Relative: 7.7 % (ref 3.0–12.0)
Neutro Abs: 2.8 10*3/uL (ref 1.4–7.7)
Neutrophils Relative %: 62.9 % (ref 43.0–77.0)
Platelets: 441 10*3/uL — ABNORMAL HIGH (ref 150.0–400.0)
RBC: 4.44 Mil/uL (ref 3.87–5.11)
RDW: 14 % (ref 11.5–15.5)
WBC: 4.5 10*3/uL (ref 4.0–10.5)

## 2021-03-07 NOTE — Progress Notes (Signed)
This visit occurred during the SARS-CoV-2 public health emergency.  Safety protocols were in place, including screening questions prior to the visit, additional usage of staff PPE, and extensive cleaning of exam room while observing appropriate contact time as indicated for disinfecting solutions.    Nicole Carrillo , 1986/03/27, 35 y.o., female MRN: [id number] Patient Care Team    Relationship Specialty Notifications Start End  Natalia Leatherwood, DO PCP - General Family Medicine  02/22/20   Thomasene Ripple, DO PCP - Cardiology Cardiology  09/12/20   Shamleffer, Konrad Dolores, MD Consulting Physician Endocrinology  04/26/20   Ellamae Sia, DO Consulting Physician Allergy  04/28/20   Magda Kiel, MD  Obstetrics and Gynecology  03/07/21     Chief Complaint  Patient presents with  . Breast Mass    Pt reports mas on L breast discovered last night; last breast exam was on 02/24/21 with Malachi Carl MD     Subjective: Pt presents for an OV with concerns over palpating a left lateral breast mass yesterday evening.  Patient has a history of fibroadenoma of her left breast, which she states was located anterior and superior portion of breast, thus this would be a different location.  She does endorse having tenderness to palpation when she pushed on the mass.  She has no personal or family history of breast cancer.  She has a paternal aunt with ovarian cancer is the only family history of cancer.  She denies fevers or night sweats.  She has had unintentional weight loss, but admits she is always had difficulty maintaining weight and this is approximately her average.  She is a type I diabetic.  She denies any recent vaccines.  She had a mild upper respiratory illness, and non-Covid, approximately 3 weeks ago.  She does not have any symptoms of illness today.  She stopped breast-feeding in January 2022. She reports she had a breast exam about 10-14 days ago by her OB/GYN and the mass was not palpated at that  time.  She feels the mass came on quickly.  Depression screen [facility] 2/9 03/07/2021 02/22/2020 02/17/2020 02/10/2020 01/14/2020  Decreased Interest 0 0 0 0 0  Down, Depressed, Hopeless 0 0 0 0 0  PHQ - 2 Score 0 0 0 0 0  Altered sleeping 0 - 0 0 0  Tired, decreased energy 0 - 0 0 0  Change in appetite 0 - 0 0 0  Feeling bad or failure about yourself  0 - 0 0 0  Trouble concentrating 0 - 0 0 0  Moving slowly or fidgety/restless 0 - 0 0 0  Suicidal thoughts 0 - 0 0 0  PHQ-9 Score 0 - 0 0 0  Difficult doing work/chores - - Not difficult at all - -    Allergies  Allergen Reactions  . Molds & Smuts Itching and Shortness Of Breath  . Shellfish Allergy Shortness Of Breath and Rash    Crab causes SOB, Scallops cause rash.  Can eat Shrimp and lobster.  . Acetaminophen Other (See Comments)    Effects blood sugars and insulin pump.  . Cephalexin Nausea Only and Other (See Comments)    Headaches, dizziness  . Pneumococcal Vaccines Swelling    Local injection swelling  . Prednisone Nausea Only and Other (See Comments)    Headaches, dizziness, Prefers not to take because of uncontrollable BG's on this med;     Marland Kitchen Soy Allergy Other (See Comments)    Blood  test  . Adhesive [Tape] Rash    With extended exposure  . Dust Mite Extract Rash  . Peanut-Containing Drug Products Rash   Social History   Social History Narrative   Marital status/children/pets: Married. Soon to have 1 son.    Education/employment: MSN, Nurse.    Safety:      -smoke alarm in the home:Yes     - wears seatbelt: Yes     - Feels safe in their relationships: Yes   Past Medical History:  Diagnosis Date  . Adverse food reaction 04/28/2020  . Allergy   . Angio-edema   . Anxiety   . Asthma   . Chicken pox   . Constipation   . Diabetes mellitus type 1 (HCC)   . Dysphagia   . Dysthymia   . Eczema   . Elevated platelet count   . Fetal CPAM   . Folliculitis   . GERD (gastroesophageal reflux disease)   . Hashimoto's  thyroiditis   . History of migraine   . Hyperlipidemia   . Insulin pump in place   . Left breast mass 01/14/2017   fibroadenoma- benign  . OCD (obsessive compulsive disorder)   . Orthodontics    permanent upper and lower retainers  . Preeclampsia 02/24/2020  . PVD (posterior vitreous detachment), right   . Rectal bleeding   . Rectal fissure   . Tinnitus   . Urticaria   . Wears contact lenses    Past Surgical History:  Procedure Laterality Date  . DENTAL SURGERY    . ENDOSCOPIC CONCHA BULLOSA RESECTION Bilateral 07/24/2018   Procedure: ENDOSCOPIC CONCHA BULLOSA MIDDLE TURBINATE;  Surgeon: Vernie MurdersJuengel, Paul, MD;  [facility];  Service: ENT;  Laterality: Bilateral;  Diabetic - insulin pump  . SEPTOPLASTY N/A 07/24/2018   Procedure: SEPTOPLASTY;  Surgeon: Vernie MurdersJuengel, Paul, MD;  [facility];  Service: ENT;  Laterality: N/A;  NEEDS TO BE FIRST INSULIN PUMP  . TURBINATE REDUCTION Bilateral 07/24/2018   Procedure: INFERIOR TURBINATE REDUCTION;  Surgeon: Vernie MurdersJuengel, Paul, MD;  [facility];  Service: ENT;  Laterality: Bilateral;  . WISDOM TOOTH EXTRACTION     Family History  Problem Relation Age of Onset  . Arthritis Mother   . Stroke Mother   . Miscarriages / [place] Mother   . Arthritis Father   . Dementia Maternal Grandmother   . Emphysema Maternal Grandfather    Allergies as of 03/07/2021      Reactions   Molds & Smuts Itching, Shortness Of Breath   Shellfish Allergy Shortness Of Breath, Rash   Crab causes SOB, Scallops cause rash.  Can eat Shrimp and lobster.   Acetaminophen Other (See Comments)   Effects blood sugars and insulin pump.   Cephalexin Nausea Only, Other (See Comments)   Headaches, dizziness   Pneumococcal Vaccines Swelling   Local injection swelling   Prednisone Nausea Only, Other (See Comments)   Headaches, dizziness, Prefers not to take because of uncontrollable BG's on this med;    Soy Allergy Other (See  Comments)   Blood test   Adhesive [tape] Rash   With extended exposure   Dust Mite Extract Rash   Peanut-containing Drug Products Rash      Medication List       Accurate as of March 07, 2021 11:59 AM. If you have any questions, ask your nurse or doctor.        albuterol 108 (90 Base) MCG/ACT inhaler Commonly known as: VENTOLIN HFA Inhale  2 puffs into the lungs every 4 (four) hours as needed for wheezing or shortness of breath.   Breo Ellipta 100-25 MCG/INH Aepb Generic drug: fluticasone furoate-vilanterol Inhale 1 puff into the lungs daily. Rinse mouth after each use.   cetirizine 10 MG tablet Commonly known as: ZYRTEC Take 10 mg by mouth 2 (two) times daily.   EPINEPHrine 0.3 mg/0.3 mL Soaj injection Commonly known as: Auvi-Q Inject 0.3 mLs (0.3 mg total) into the muscle as needed for anaphylaxis.   Glucagon 1 MG/0.2ML Soaj Inject 1 mg into the skin as directed.   insulin lispro 100 UNIT/ML injection Commonly known as: HUMALOG Use 40 max units daily in insulin pump   Microlet Lancets Misc   montelukast 10 MG tablet Commonly known as: SINGULAIR TAKE 1 TABLET BY MOUTH EVERY DAY   NON FORMULARY Continuous glucose monitor   prenatal multivitamin Tabs tablet Take 1 tablet by mouth daily at 12 noon.       All past medical history, surgical history, allergies, family history, immunizations andmedications were updated in the EMR today and reviewed under the history and medication portions of their EMR.     ROS: Negative, with the exception of above mentioned in HPI   Objective:  BP 110/70   Pulse 70   Temp 98.2 F (36.8 C) (Oral)   Ht 5\' 2"  (1.575 m)   Wt 102 lb (46.3 kg)   LMP 03/01/2021   SpO2 99%   BMI 18.66 kg/m  Body mass index is 18.66 kg/m. Gen: Afebrile. No acute distress. Nontoxic in appearance, well developed, well nourished.  HENT: AT. [place].  Moist mucous membranes  eyes:Pupils Equal Round Reactive to light, Extraocular movements intact,   Conjunctiva without redness, discharge or icterus. lymph/breast: no submandibular or cervical cervical lymphadenopathy. Left axillary/breast line spongy mobile tender mass 1.2 x 1.0 cm.  In addition, approximately 1 inch from nipple of left breast at the 1 o'clock position is a small less than 2 mm BB-like mass.  No skin or nipple changes of left breast. right breast: Breast appears normal, no suspicious masses, no skin or nipple changes or axillary nodes. Neuro: Alert. Oriented x3  Psych: Normal affect, dress and demeanor. Normal speech. Normal thought content and judgment.  No exam data present No results found. No results found for this or any previous visit (from the past 24 hour(s)).  Assessment/Plan: Kirstie Larsen is a 35 y.o. female present for OV for  Left breast mass/lymphadenopathy First breast mass very small anterior superior portion of breast possibly the area of her prior fibroadenoma per patient.  This is uncertain. Second breast mass is consistent with possible lymph node enlargement. Discussed options with her today and she would like to move forward with mammogram and ultrasound.  We will also obtain a CBC. No family history of breast cancer or lymphoma.  She reports she has not had any recent vaccines, stopped breast-feeding in January. - MM Digital Diagnostic Unilat L; Future - MM Digital Diagnostic Unilat R; Future - February BREAST LTD UNI RIGHT INC AXILLA; Future - US BREAST LTD UNI LEFT INC AXILLA; Future - CBC w/Diff Follow-up dependent upon imaging results   Reviewed expectations re: course of current medical issues.  Discussed self-management of symptoms.  Outlined signs and symptoms indicating need for more acute intervention.  Patient verbalized understanding and all questions were answered.  Patient received an After-Visit Summary.    Orders Placed This Encounter  Procedures  . MM Digital Diagnostic Unilat L  .  MM Digital Diagnostic Unilat R  . US  BREAST LTD UNI RIGHT INC AXILLA  . US BREAST LTD UNI LEFT INC AXILLA  . CBC w/Diff   No orders of the defined types were placed in this encounter.  Referral Orders  No referral(s) requested today     Note is dictated utilizing voice recognition software. Although note has been proof read prior to signing, occasional typographical errors still can be missed. If any questions arise, please do not hesitate to call for verification.   electronically signed by:  Felix Pacini, DO  [facility] - [place]

## 2021-03-07 NOTE — Patient Instructions (Signed)
I have ordered the imaging for [facility] in [place]. They will call you to schedule.   We will call you with all results once available.

## 2021-03-08 ENCOUNTER — Ambulatory Visit
Admission: RE | Admit: 2021-03-08 | Discharge: 2021-03-08 | Disposition: A | Discharge status: Home / Self Care | Payer: Managed Care, Other (non HMO) | Source: Ambulatory Visit | Attending: Family Medicine | Admitting: Family Medicine

## 2021-03-08 DIAGNOSIS — N6320 Unspecified lump in the left breast, unspecified quadrant: Secondary | ICD-10-CM

## 2021-03-08 DIAGNOSIS — R590 Localized enlarged lymph nodes: Secondary | ICD-10-CM

## 2021-03-10 ENCOUNTER — Ambulatory Visit (HOSPITAL_COMMUNITY): Payer: 59 | Admitting: Licensed Clinical Social Worker

## 2021-03-31 ENCOUNTER — Ambulatory Visit (HOSPITAL_COMMUNITY): Payer: 59 | Admitting: Licensed Clinical Social Worker

## 2021-04-03 ENCOUNTER — Other Ambulatory Visit: Payer: Self-pay

## 2021-04-03 ENCOUNTER — Encounter: Payer: Self-pay | Admitting: Family Medicine

## 2021-04-03 ENCOUNTER — Ambulatory Visit: Payer: Managed Care, Other (non HMO) | Admitting: Family Medicine

## 2021-04-03 VITALS — BP 125/67 | HR 81 | Temp 98.2°F | Ht 62.0 in | Wt 100.0 lb

## 2021-04-03 DIAGNOSIS — Z3201 Encounter for pregnancy test, result positive: Secondary | ICD-10-CM

## 2021-04-03 DIAGNOSIS — O0991 Supervision of high risk pregnancy, unspecified, first trimester: Secondary | ICD-10-CM | POA: Diagnosis not present

## 2021-04-03 DIAGNOSIS — Z3491 Encounter for supervision of normal pregnancy, unspecified, first trimester: Secondary | ICD-10-CM

## 2021-04-03 DIAGNOSIS — Z3200 Encounter for pregnancy test, result unknown: Secondary | ICD-10-CM | POA: Diagnosis not present

## 2021-04-03 LAB — POCT URINE PREGNANCY: Preg Test, Ur: POSITIVE — AB

## 2021-04-03 NOTE — Progress Notes (Signed)
This visit occurred during the SARS-CoV-2 public health emergency.  Safety protocols were in place, including screening questions prior to the visit, additional usage of staff PPE, and extensive cleaning of exam room while observing appropriate contact time as indicated for disinfecting solutions.    Nicole Carrillo , 1986/11/04, 35 y.o., female MRN: [id number] Patient Care Team    Relationship Specialty Notifications Start End  Natalia Leatherwood, DO PCP - General Family Medicine  02/22/20   Thomasene Ripple, DO PCP - Cardiology Cardiology  09/12/20   Shamleffer, Konrad Dolores, MD Consulting Physician Endocrinology  04/26/20   Ellamae Sia, DO Consulting Physician Allergy  04/28/20   Magda Kiel, MD  Obstetrics and Gynecology  03/07/21     Chief Complaint  Patient presents with  . Amenorrhea    Pt has taken pregnancy x3 with pos results starting 4/18 LMP 03/01/21; Pt has not contacted OBGYN;      Subjective: Pt presents for an OV with complaints of 3 positive pregnancy tests at home. She denies breast tenderness or fatigue. She has seen a good change in her glucose. She has restarted her prenatal vitamin. She has called her endocrinologist and has an appt this week.  Patient's last menstrual period was 03/01/2021 (exact date). She is a G1P1. She and her husband have a son that is a little over a year old.   Depression screen [facility] 2/9 04/03/2021 03/07/2021 02/22/2020 02/17/2020 02/10/2020  Decreased Interest 0 0 0 0 0  Down, Depressed, Hopeless 0 0 0 0 0  PHQ - 2 Score 0 0 0 0 0  Altered sleeping - 0 - 0 0  Tired, decreased energy - 0 - 0 0  Change in appetite - 0 - 0 0  Feeling bad or failure about yourself  - 0 - 0 0  Trouble concentrating - 0 - 0 0  Moving slowly or fidgety/restless - 0 - 0 0  Suicidal thoughts - 0 - 0 0  PHQ-9 Score - 0 - 0 0  Difficult doing work/chores - - - Not difficult at all -    Allergies  Allergen Reactions  . Molds & Smuts Itching and Shortness Of Breath  .  Shellfish Allergy Shortness Of Breath and Rash    Crab causes SOB, Scallops cause rash.  Can eat Shrimp and lobster.  . Acetaminophen Other (See Comments)    Effects blood sugars and insulin pump.  . Cephalexin Nausea Only and Other (See Comments)    Headaches, dizziness  . Pneumococcal Vaccines Swelling    Local injection swelling  . Prednisone Nausea Only and Other (See Comments)    Headaches, dizziness, Prefers not to take because of uncontrollable BG's on this med;     Marland Kitchen Soy Allergy Other (See Comments)    Blood test  . Adhesive [Tape] Rash    With extended exposure  . Dust Mite Extract Rash  . Peanut-Containing Drug Products Rash   Social History   Social History Narrative   Marital status/children/pets: Married. Soon to have 1 son.    Education/employment: MSN, Nurse.    Safety:      -smoke alarm in the home:Yes     - wears seatbelt: Yes     - Feels safe in their relationships: Yes   Past Medical History:  Diagnosis Date  . Adverse food reaction 04/28/2020  . Allergy   . Angio-edema   . Anxiety   . Asthma   . Chicken pox   .  Constipation   . Diabetes mellitus type 1 (HCC)   . Dysphagia   . Dysthymia   . Eczema   . Elevated platelet count   . Fetal CPAM   . Folliculitis   . GERD (gastroesophageal reflux disease)   . Hashimoto's thyroiditis   . History of migraine   . Hyperlipidemia   . Insulin pump in place   . Left breast mass 01/14/2017   fibroadenoma- benign  . OCD (obsessive compulsive disorder)   . Orthodontics    permanent upper and lower retainers  . Preeclampsia 02/24/2020  . PVD (posterior vitreous detachment), right   . Rectal bleeding   . Rectal fissure   . Tinnitus   . Urticaria   . Wears contact lenses    Past Surgical History:  Procedure Laterality Date  . DENTAL SURGERY    . ENDOSCOPIC CONCHA BULLOSA RESECTION Bilateral 07/24/2018   Procedure: ENDOSCOPIC CONCHA BULLOSA MIDDLE TURBINATE;  Surgeon: Vernie Murders, MD;  [facility];  Service: ENT;  Laterality: Bilateral;  Diabetic - insulin pump  . SEPTOPLASTY N/A 07/24/2018   Procedure: SEPTOPLASTY;  Surgeon: Vernie Murders, MD;  Location: [facility];  Service: ENT;  Laterality: N/A;  NEEDS TO BE FIRST INSULIN PUMP  . TURBINATE REDUCTION Bilateral 07/24/2018   Procedure: INFERIOR TURBINATE REDUCTION;  Surgeon: Vernie Murders, MD;  Location[facility];  Service: ENT;  Laterality: Bilateral;  . WISDOM TOOTH EXTRACTION     Family History  Problem Relation Age of Onset  . Arthritis Mother   . Stroke Mother   . Miscarriages / [place] Mother   . Arthritis Father   . Dementia Maternal Grandmother   . Emphysema Maternal Grandfather    Allergies as of 04/03/2021      Reactions   Molds & Smuts Itching, Shortness Of Breath   Shellfish Allergy Shortness Of Breath, Rash   Crab causes SOB, Scallops cause rash.  Can eat Shrimp and lobster.   Acetaminophen Other (See Comments)   Effects blood sugars and insulin pump.   Cephalexin Nausea Only, Other (See Comments)   Headaches, dizziness   Pneumococcal Vaccines Swelling   Local injection swelling   Prednisone Nausea Only, Other (See Comments)   Headaches, dizziness, Prefers not to take because of uncontrollable BG's on this med;    Soy Allergy Other (See Comments)   Blood test   Adhesive [tape] Rash   With extended exposure   Dust Mite Extract Rash   Peanut-containing Drug Products Rash      Medication List       Accurate as of April 03, 2021  1:23 PM. If you have any questions, ask your nurse or doctor.        albuterol 108 (90 Base) MCG/ACT inhaler Commonly known as: VENTOLIN HFA Inhale 2 puffs into the lungs every 4 (four) hours as needed for wheezing or shortness of breath.   Breo Ellipta 100-25 MCG/INH Aepb Generic drug: fluticasone furoate-vilanterol Inhale 1 puff into the lungs daily. Rinse mouth after each use.   cetirizine 10 MG tablet Commonly known as: ZYRTEC Take  10 mg by mouth 2 (two) times daily.   EPINEPHrine 0.3 mg/0.3 mL Soaj injection Commonly known as: Auvi-Q Inject 0.3 mLs (0.3 mg total) into the muscle as needed for anaphylaxis.   Glucagon 1 MG/0.2ML Soaj Inject 1 mg into the skin as directed.   insulin lispro 100 UNIT/ML injection Commonly known as: HUMALOG Use 40 max units daily in insulin pump  Microlet Lancets Misc   montelukast 10 MG tablet Commonly known as: SINGULAIR TAKE 1 TABLET BY MOUTH EVERY DAY   NON FORMULARY Continuous glucose monitor   prenatal multivitamin Tabs tablet Take 1 tablet by mouth daily at 12 noon.       All past medical history, surgical history, allergies, family history, immunizations andmedications were updated in the EMR today and reviewed under the history and medication portions of their EMR.     ROS: Negative, with the exception of above mentioned in HPI   Objective:  BP 125/67   Pulse 81   Temp 98.2 F (36.8 C) (Oral)   Ht 5\' 2"  (1.575 m)   Wt 100 lb (45.4 kg)   LMP 03/01/2021   SpO2 98%   BMI 18.29 kg/m  Body mass index is 18.29 kg/m. Gen: Afebrile. No acute distress. Nontoxic in appearance, well developed, well nourished.  HENT: AT. [place].  Eyes:Pupils Equal Round Reactive to light, Extraocular movements intact,  Conjunctiva without redness, discharge or icterus. CV: RRR Chest: CTAB.  Neuro: Normal gait. PERLA. EOMi. Alert. Oriented x3  Psych: Normal affect, dress and demeanor. Normal speech. Normal thought content and judgment.  No exam data present No results found. Results for orders placed or performed in visit on 04/03/21 (from the past 24 hour(s))  POCT urine pregnancy     Status: Abnormal   Collection Time: 04/03/21  1:19 PM  Result Value Ref Range   Preg Test, Ur Positive (A) Negative    Assessment/Plan: Nicole Carrillo is a 35 y.o. female present for OV for  Possible pregnancy/First trimester pregnancy/High-risk pregnancy in first trimester G2P1001 at 4w,  4d.  She has already restarted her PNV and has follow up schedule this week with her endocrine. She is a high risk pregnancy with her H/o of IDDM and prior PreE. - referral placed back to OB for close follow up.  - POCT urine pregnancy> positive.     Reviewed expectations re: course of current medical issues.  Discussed self-management of symptoms.  Outlined signs and symptoms indicating need for more acute intervention.  Patient verbalized understanding and all questions were answered.  Patient received an After-Visit Summary.    Orders Placed This Encounter  Procedures  . POCT urine pregnancy   No orders of the defined types were placed in this encounter.  Referral Orders  No referral(s) requested today     Note is dictated utilizing voice recognition software. Although note has been proof read prior to signing, occasional typographical errors still can be missed. If any questions arise, please do not hesitate to call for verification.   electronically signed by:  20, DO  [facility] [place]

## 2021-04-03 NOTE — Patient Instructions (Signed)
CONGRATS!

## 2021-04-05 ENCOUNTER — Encounter: Payer: Self-pay | Admitting: Internal Medicine

## 2021-04-05 ENCOUNTER — Other Ambulatory Visit: Payer: Self-pay

## 2021-04-05 ENCOUNTER — Ambulatory Visit: Payer: Managed Care, Other (non HMO) | Admitting: Internal Medicine

## 2021-04-05 VITALS — BP 124/70 | HR 85 | Ht 62.0 in | Wt 102.2 lb

## 2021-04-05 DIAGNOSIS — E063 Autoimmune thyroiditis: Secondary | ICD-10-CM | POA: Diagnosis not present

## 2021-04-05 DIAGNOSIS — E1065 Type 1 diabetes mellitus with hyperglycemia: Secondary | ICD-10-CM

## 2021-04-05 LAB — POCT GLUCOSE (DEVICE FOR HOME USE): POC Glucose: 140 mg/dl — AB (ref 70–99)

## 2021-04-05 LAB — COMPREHENSIVE METABOLIC PANEL
ALT: 15 U/L (ref 0–35)
AST: 16 U/L (ref 0–37)
Albumin: 4.5 g/dL (ref 3.5–5.2)
Alkaline Phosphatase: 87 U/L (ref 39–117)
BUN: 14 mg/dL (ref 6–23)
CO2: 26 mEq/L (ref 19–32)
Calcium: 10.1 mg/dL (ref 8.4–10.5)
Chloride: 101 mEq/L (ref 96–112)
Creatinine, Ser: 0.69 mg/dL (ref 0.40–1.20)
GFR: 112.85 mL/min (ref >60.00)
Glucose, Bld: 121 mg/dL — ABNORMAL HIGH (ref 70–99)
Potassium: 4.2 mEq/L (ref 3.5–5.1)
Sodium: 135 mEq/L (ref 135–145)
Total Bilirubin: 0.7 mg/dL (ref 0.2–1.2)
Total Protein: 8 g/dL (ref 6.0–8.3)

## 2021-04-05 LAB — POCT GLYCOSYLATED HEMOGLOBIN (HGB A1C): Hemoglobin A1C: 7.7 % — AB (ref 4.0–5.6)

## 2021-04-05 LAB — MICROALBUMIN / CREATININE URINE RATIO
Creatinine,U: 19.1 mg/dL
Microalb Creat Ratio: 3.7 mg/g (ref 0.0–30.0)
Microalb, Ur: <0.7 mg/dL (ref 0.0–1.9)

## 2021-04-05 LAB — TSH: TSH: 1.18 u[IU]/mL (ref 0.35–4.50)

## 2021-04-05 MED ORDER — DEXCOM G6 TRANSMITTER MISC: 1.0000 | 3 refills | Status: DC

## 2021-04-05 MED ORDER — DEXCOM G6 SENSOR MISC: 1.0000 | 3 refills | Status: DC

## 2021-04-05 NOTE — Progress Notes (Signed)
Name: Nicole Carrillo  Age/ Sex: 35 y.o., female   MRN/ DOB: [id number], [birth year]     PCP: Natalia Leatherwood, DO   Reason for Endocrinology Evaluation: Type 1 Diabetes Mellitus  Initial Endocrine Consultative Visit: 8/15/221    PATIENT IDENTIFIER: Nicole Carrillo is a 35 y.o. female with a past medical history of T1DM and Hashimoto's Thyroiditis. The patient has followed with Endocrinology clinic since 04/26/2020 for consultative assistance with management of her diabetes.  DIABETIC HISTORY:  Nicole Carrillo was initially diagnosed with T2DM at age 66 but this diagnosis was subsequently changed to T1DM after hospitalization for hyperglycemia and HHS. It is unclear to me the circumstances of her diagnosis. She has been on insulin pumps for  Years. Her hemoglobin A1c has ranged from 6.6% in 2021, peaking at 7.7% in 2020.   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 We adjusted pump settings  Today (04/05/2021): Nicole Carrillo is here for a follow up on diabetes management.   She checks her blood sugars multiple  times daily through the [employer] CGM. The patient has not had hypoglycemic episodes since the last clinic visit, she is not nursing as much .  She is currently pregnant ~ 5 weeks   LMP 03/01/2021  Denies nausea  , abdominal pain or diarrhea    HOME DIABETES REGIMEN:  HUmalog     Pump   MEDTRONIC   Settings   Insulin type   Humalog    Basal rate       0000-0400  0.300 u/h    0400-0700  0.450  u/h   0700-1200  0.700 u/h    1200-1800 0.600 u/h    1800-0000 0.500 u/hr          I:C ratio       0000-0600  15   0600-1000 9   1000-1600 13   1600- 2200 13   2200- 0000 15  Sensitivity       0000  75      AIT     0000  4         Type & Model of Pump: Medtronic 770 Insulin Type:  Currently using Humalog     PUMP ADHERENCE: Bolus Wizard - using most of the time - with food and correction  Bolus Wizard Override- used  - no manual boluses  Infusion Set Changes- q 3days  Reservoir change - every 9 days   CURRENT PUMP STATISTICS: Auto mode = 0% Manual mode = 100% Sensor wear = 92% Average BG: 218 +/- 72 Average SG: 180 +/- 62 Estimated A1C: 7.6 BG checks / Calibrations/day: 3.5 Average Daily Carbs (g): 94+/- 31 Average Total Daily Insulin:  23.8units Average Daily Bolus: 13.3 units (56%) Average Daily Basal:  10.5units (44%) Time in Range: High (>250) = 15% Above target (180-250) =28 % In Range (70-180) = 56% Below Target (<70 but >50) = 1% Low (<50) =0 %      Statin: no ACE-I/ARB: no     DIABETIC COMPLICATIONS: Microvascular complications:   Psoterior retinal detachment and bleed that is attributed to HTN  Denies: CKD, neuropathy  Last Eye Exam: Completed 01/2020  Macrovascular complications:    Denies: CAD, CVA, PVD   HISTORY:  Past Medical History:  Past Medical History:  Diagnosis Date  . Adverse food reaction 04/28/2020  .  Allergy   . Angio-edema   . Anxiety   . Asthma   . Chicken pox   . Constipation   . Diabetes mellitus type 1 (HCC)   . Dysphagia   . Dysthymia   . Eczema   . Elevated platelet count   . Fetal CPAM   . Folliculitis   . GERD (gastroesophageal reflux disease)   . Hashimoto's thyroiditis   . History of migraine   . Hyperlipidemia   . Insulin pump in place   . Left breast mass 01/14/2017   fibroadenoma- benign  . OCD (obsessive compulsive disorder)   . Orthodontics    permanent upper and lower retainers  . Preeclampsia 02/24/2020  . PVD (posterior vitreous detachment), right   . Rectal bleeding   . Rectal fissure   . Tinnitus   . Urticaria   . Wears contact lenses    Past Surgical History:  Past Surgical History:  Procedure Laterality Date  . DENTAL SURGERY    . ENDOSCOPIC CONCHA  BULLOSA RESECTION Bilateral 07/24/2018   Procedure: ENDOSCOPIC CONCHA BULLOSA MIDDLE TURBINATE;  Surgeon: Vernie MurdersJuengel, Paul, MD;  [facility];  Service: ENT;  Laterality: Bilateral;  Diabetic - insulin pump  . SEPTOPLASTY N/A 07/24/2018   Procedure: SEPTOPLASTY;  Surgeon: Vernie MurdersJuengel, Paul, MD;  [facility];  Service: ENT;  Laterality: N/A;  NEEDS TO BE FIRST INSULIN PUMP  . TURBINATE REDUCTION Bilateral 07/24/2018   Procedure: INFERIOR TURBINATE REDUCTION;  Surgeon: Vernie MurdersJuengel, Paul, MD;  [facility];  Service: ENT;  Laterality: Bilateral;  . WISDOM TOOTH EXTRACTION      Social History:  reports that she has never smoked. She has never used smokeless tobacco. She reports previous alcohol use of about 1.0 standard drink of alcohol per week. She reports previous drug use. Family History:  Family History  Problem Relation Age of Onset  . Arthritis Mother   . Stroke Mother   . Miscarriages / [place] Mother   . Arthritis Father   . Dementia Maternal Grandmother   . Emphysema Maternal Grandfather      HOME MEDICATIONS: Allergies as of 04/05/2021      Reactions   Molds & Smuts Itching, Shortness Of Breath   Shellfish Allergy Shortness Of Breath, Rash   Crab causes SOB, Scallops cause rash.  Can eat Shrimp and lobster.   Acetaminophen Other (See Comments)   Effects blood sugars and insulin pump.   Cephalexin Nausea Only, Other (See Comments)   Headaches, dizziness   Pneumococcal Vaccines Swelling   Local injection swelling   Prednisone Nausea Only, Other (See Comments)   Headaches, dizziness, Prefers not to take because of uncontrollable BG's on this med;    Soy Allergy Other (See Comments)   Blood test   Adhesive [tape] Rash   With extended exposure   Dust Mite Extract Rash   Peanut-containing Drug Products Rash      Medication List       Accurate as of April 05, 2021  2:50 PM. If you have any questions, ask your nurse or doctor.         albuterol 108 (90 Base) MCG/ACT inhaler Commonly known as: VENTOLIN HFA Inhale 2 puffs into the lungs every 4 (four) hours as needed for wheezing or shortness of breath.   Breo Ellipta 100-25 MCG/INH Aepb Generic drug: fluticasone furoate-vilanterol Inhale 1 puff into the lungs daily. Rinse mouth after each use.   cetirizine 10 MG tablet Commonly known as: ZYRTEC Take  10 mg by mouth 2 (two) times daily.   EPINEPHrine 0.3 mg/0.3 mL Soaj injection Commonly known as: Auvi-Q Inject 0.3 mLs (0.3 mg total) into the muscle as needed for anaphylaxis.   Glucagon 1 MG/0.2ML Soaj Inject 1 mg into the skin as directed.   insulin lispro 100 UNIT/ML injection Commonly known as: HUMALOG Use 40 max units daily in insulin pump   Microlet Lancets Misc   montelukast 10 MG tablet Commonly known as: SINGULAIR TAKE 1 TABLET BY MOUTH EVERY DAY   NON FORMULARY Continuous glucose monitor   prenatal multivitamin Tabs tablet Take 1 tablet by mouth daily at 12 noon.        OBJECTIVE:   Vital Signs: BP 124/70   Pulse 85   Ht 5\' 2"  (1.575 m)   Wt 102 lb 4 oz (46.4 kg)   LMP 03/01/2021 (Exact Date)   SpO2 98%   BMI 18.70 kg/m    Wt Readings from Last 3 Encounters:  04/05/21 102 lb 4 oz (46.4 kg)  04/03/21 100 lb (45.4 kg)  03/07/21 102 lb (46.3 kg)     Exam: General: Pt appears well and is in NAD  Lungs: Clear with good BS bilat with no rales, rhonchi, or wheezes  Heart: RRR with normal S1 and S2 and no gallops; no murmurs; no rub  Abdomen: Normoactive bowel sounds, soft, nontender, without masses or organomegaly palpable  Extremities: No pretibial edema.   Neuro: MS is good with appropriate affect, pt is alert and Ox3    DM foot exam: 04/05/2021  The skin of the feet is intact without sores or ulcerations. The pedal pulses are 2+ on right and 2+ on left. The sensation is intact to a screening 5.07, 10 gram monofilament bilaterally    DATA REVIEWED:  Lab Results   Component Value Date   HGBA1C 7.7 (A) 04/05/2021   HGBA1C 8.2 (A) 11/25/2020   HGBA1C 7.0 (A) 07/28/2020   Lab Results  Component Value Date   MICROALBUR 0.9 04/05/2020   CREATININE 0.78 08/24/2020   Lab Results  Component Value Date   MICRALBCREAT 1.3 04/05/2020    Results for Nicole Carrillo, Nicole Carrillo (MRN [name]) as of 04/07/2021 08:05  Ref. Range 04/05/2021 15:09  Sodium Latest Ref Range: 135 - 145 mEq/L 135  Potassium Latest Ref Range: 3.5 - 5.1 mEq/L 4.2  Chloride Latest Ref Range: 96 - 112 mEq/L 101  CO2 Latest Ref Range: 19 - 32 mEq/L 26  Glucose Latest Ref Range: 70 - 99 mg/dL 04/07/2021 (H)  BUN Latest Ref Range: 6 - 23 mg/dL 14  Creatinine Latest Ref Range: 0.40 - 1.20 mg/dL 622  Calcium Latest Ref Range: 8.4 - 10.5 mg/dL 2.97  Alkaline Phosphatase Latest Ref Range: 39 - 117 U/L 87  Albumin Latest Ref Range: 3.5 - 5.2 g/dL 4.5  AST Latest Ref Range: 0 - 37 U/L 16  ALT Latest Ref Range: 0 - 35 U/L 15  Total Protein Latest Ref Range: 6.0 - 8.3 g/dL 8.0  Total Bilirubin Latest Ref Range: 0.2 - 1.2 mg/dL 0.7  GFR Latest Ref Range: >60.00 mL/min 112.85  MICROALB/CREAT RATIO Latest Ref Range: 0.0 - 30.0 mg/g 3.7  TSH Latest Ref Range: 0.35 - 4.50 uIU/mL 1.18  T4,Free(Direct) Latest Ref Range: 0.60 - 1.60 ng/dL 98.9  Creatinine,U Latest Units: mg/dL 2.11  Microalb, Ur Latest Ref Range: 0.0 - 1.9 mg/dL 94.1     ASSESSMENT / PLAN / RECOMMENDATIONS:   1) Type 1 Diabetes Mellitus, Poorly  controlled, Without complications - Most recent A1c of 7.7 %. Goal A1c < 6.5 %.     - Pt with hyperglycemia , she has been having issues with the Guardian sensor . Unable to download pump the pump, no data in the carelink since 02/2021 - We have opted to take her off auto-mode since she is having sensor issues and  Pregnancy  - We discussed pregnancy goals    - Will adjust the basal rate and I:C ratio     MEDICATIONS:  Humalog     Pump   MEDTRONIC   Settings   Insulin type   Humalog     Basal rate       0000-0400  0.270 u/h    0400-0700  0.405 u/h   0700-1200  0.630 u/h    1200-1800 0.540 u/h    1800-0000 0.450 u/hr          I:C ratio       0000-0600  14   0600-1000 9   1000-1600 12   1600- 2200 12   2200- 0000 14  Sensitivity       0000  70      AIT     0000  3          EDUCATION / INSTRUCTIONS:  BG monitoring instructions: Patient is instructed to check her blood sugars 4 times a day, before meals and bedtime   Call [facility] if: BG persistently < 70 . Marland Kitchen I reviewed the Rule of 15 for the treatment of hypoglycemia in detail with the patient. Literature supplied.   2) Diabetic complications:   Eye: Does not have known diabetic retinopathy.   Neuro/ Feet: Does not have known diabetic peripheral neuropathy .   Renal: Patient does not have known baseline CKD. She   is  on an ACEI/ARB at present.     3) Hashimoto's Disease:  - No local neck symptoms  - TFT's are normal     F/U in 8 weeks    Signed electronically by: Lyndle Herrlich, MD  [facility]  [facility] [address] [place] [place], [place] [address] Phone: [phone] FAX: [phone]   CC: Natalia Leatherwood, DO [address] [place] [address] Phone: [phone]  Fax: [phone]  Return to Endocrinology clinic as below: Future Appointments  Date Time Provider Department Center  04/24/2021 11:20 AM Tobb, Lavona Mound, DO [facility] None  05/30/2021 11:00 AM Ellamae Sia, DO [facility] None

## 2021-04-05 NOTE — Patient Instructions (Addendum)
    Pump   MEDTRONIC   Settings   Insulin type   Humalog    Basal rate       0000-0400  0.270 u/h    0400-0700  0.405 u/h   0700-1200  0.630 u/h    1200-1800 0.540 u/h    1800-0000 0.450 u/hr          I:C ratio       0000-0600  14   0600-1000 9   1000-1600 12   1600- 2200 12   2200- 0000 14  Sensitivity       0000  70      AIT     0000  3         TIMING  BLOOD SUGAR GOALS  FASTING (before breakfast)  60 - 95  BEFORE MEALS LESS THAN 100  2 hours AFTER MEAL LESS THAN 120        HOW TO TREAT LOW BLOOD SUGARS (Blood sugar LESS THAN 60 MG/DL)  Please follow the RULE OF 15 for the treatment of hypoglycemia treatment (when your (blood sugars are less than 60 mg/dL)    STEP 1: Take 15 grams of carbohydrates when your blood sugar is low, which includes:   3-4 GLUCOSE TABS  OR  3-4 OZ OF JUICE OR REGULAR SODA OR  ONE TUBE OF GLUCOSE GEL     STEP 2: RECHECK blood sugar in 15 MINUTES STEP 3: If your blood sugar is still low at the 15 minute recheck --> then, go back to STEP 1 and treat AGAIN with another 15 grams of carbohydrates.

## 2021-04-06 LAB — T4, FREE: Free T4: 0.68 ng/dL (ref 0.60–1.60)

## 2021-04-11 ENCOUNTER — Other Ambulatory Visit: Payer: Self-pay | Admitting: Internal Medicine

## 2021-04-12 ENCOUNTER — Encounter: Payer: Self-pay | Admitting: Internal Medicine

## 2021-04-12 ENCOUNTER — Telehealth: Payer: Self-pay | Admitting: Internal Medicine

## 2021-04-12 NOTE — Telephone Encounter (Signed)
Spoke to [place] [employer] rep on 04/12/2021 at 9 Am and discussed our inability to download the pt's pump. She had left a message for the past and is going to stop our office tomorrow to check the cloud    Abby Raelyn Mora, MD  [facility]  [facility] [address] [place], [place] [address] Phone: [phone] FAX: [phone]

## 2021-04-17 ENCOUNTER — Encounter: Payer: Self-pay | Admitting: Internal Medicine

## 2021-04-21 DIAGNOSIS — B019 Varicella without complication: Secondary | ICD-10-CM | POA: Insufficient documentation

## 2021-04-21 DIAGNOSIS — Z8669 Personal history of other diseases of the nervous system and sense organs: Secondary | ICD-10-CM | POA: Insufficient documentation

## 2021-04-21 DIAGNOSIS — E109 Type 1 diabetes mellitus without complications: Secondary | ICD-10-CM | POA: Insufficient documentation

## 2021-04-21 DIAGNOSIS — T7840XA Allergy, unspecified, initial encounter: Secondary | ICD-10-CM | POA: Insufficient documentation

## 2021-04-21 DIAGNOSIS — R7989 Other specified abnormal findings of blood chemistry: Secondary | ICD-10-CM | POA: Insufficient documentation

## 2021-04-21 DIAGNOSIS — K602 Anal fissure, unspecified: Secondary | ICD-10-CM | POA: Insufficient documentation

## 2021-04-21 DIAGNOSIS — K5900 Constipation, unspecified: Secondary | ICD-10-CM | POA: Insufficient documentation

## 2021-04-21 DIAGNOSIS — K625 Hemorrhage of anus and rectum: Secondary | ICD-10-CM | POA: Insufficient documentation

## 2021-04-21 DIAGNOSIS — O35BXX0 Maternal care for other (suspected) fetal abnormality and damage, fetal cardiac anomalies, not applicable or unspecified: Secondary | ICD-10-CM | POA: Insufficient documentation

## 2021-04-21 DIAGNOSIS — L739 Follicular disorder, unspecified: Secondary | ICD-10-CM | POA: Insufficient documentation

## 2021-04-21 DIAGNOSIS — Z464 Encounter for fitting and adjustment of orthodontic device: Secondary | ICD-10-CM | POA: Insufficient documentation

## 2021-04-21 DIAGNOSIS — K219 Gastro-esophageal reflux disease without esophagitis: Secondary | ICD-10-CM | POA: Insufficient documentation

## 2021-04-21 DIAGNOSIS — Z973 Presence of spectacles and contact lenses: Secondary | ICD-10-CM | POA: Insufficient documentation

## 2021-04-24 ENCOUNTER — Other Ambulatory Visit: Payer: Self-pay

## 2021-04-24 ENCOUNTER — Encounter: Payer: Self-pay | Admitting: Cardiology

## 2021-04-24 ENCOUNTER — Ambulatory Visit: Payer: Managed Care, Other (non HMO) | Admitting: Cardiology

## 2021-04-24 VITALS — BP 124/86 | HR 88 | Ht 62.0 in | Wt 108.1 lb

## 2021-04-24 DIAGNOSIS — M79604 Pain in right leg: Secondary | ICD-10-CM | POA: Diagnosis not present

## 2021-04-24 DIAGNOSIS — E782 Mixed hyperlipidemia: Secondary | ICD-10-CM

## 2021-04-24 DIAGNOSIS — M79605 Pain in left leg: Secondary | ICD-10-CM

## 2021-04-24 DIAGNOSIS — O1493 Unspecified pre-eclampsia, third trimester: Secondary | ICD-10-CM | POA: Diagnosis not present

## 2021-04-24 DIAGNOSIS — O165 Unspecified maternal hypertension, complicating the puerperium: Secondary | ICD-10-CM

## 2021-04-24 DIAGNOSIS — O0991 Supervision of high risk pregnancy, unspecified, first trimester: Secondary | ICD-10-CM

## 2021-04-24 NOTE — Patient Instructions (Signed)
Medication Instructions:  Your physician recommends that you continue on your current medications as directed. Please refer to the Current Medication list given to you today.  *If you need a refill on your cardiac medications before your next appointment, please call your pharmacy*   Lab Work: None If you have labs (blood work) drawn today and your tests are completely normal, you will receive your results only by: Marland Kitchen MyChart Message (if you have MyChart) OR . A paper copy in the mail If you have any lab test that is abnormal or we need to change your treatment, we will call you to review the results.   Testing/Procedures: Your physician has requested that you have a lower extremity arterial duplex. This test is an ultrasound of the arteries in the legs. It looks at arterial blood flow in the legs. Allow one hour for Lower Arterial scans. There are no restrictions or special instructions    Follow-Up: At [facility], you and your health needs are our priority.  As part of our continuing mission to provide you with exceptional heart care, we have created designated Provider Care Teams.  These Care Teams include your primary Cardiologist (physician) and Advanced Practice Providers (APPs -  Physician Assistants and Nurse Practitioners) who all work together to provide you with the care you need, when you need it.  We recommend signing up for the patient portal called "MyChart".  Sign up information is provided on this After Visit Summary.  MyChart is used to connect with patients for Virtual Visits (Telemedicine).  Patients are able to view lab/test results, encounter notes, upcoming appointments, etc.  Non-urgent messages can be sent to your provider as well.   To learn more about what you can do with MyChart, go to [email or web address].    Your next appointment:   12 week(s)  The format for your next appointment:   In Person  Provider:   [facility]   Other  Instructions

## 2021-04-24 NOTE — Progress Notes (Addendum)
[facility]  Follow up note  asthma, preeclampsia, postpartum hypertension, hyperlipidemia, Hashimoto's thyroiditis and type 1 diabetes diagnosed at 34 years,Asthma is here today for a follow up.   I saw the patient on 09/12/2020 at that she reported chest pain, I send the patient for Stress echo and placed a 14 day monitor due to palpitation. I also recommended that patient to behavior health for postpartum depression.  I saw the patient on December 30, 2020 at that time we talked about her testing results.  She was closer to her baseline.  She was back in school.  And contemplating having another child.  She is here today for follow-up visit.  She is [redacted] weeks pregnant.  Tells me that she has been experiencing intermittent bilateral leg pain.  Is off and on but does not last long, no swelling or no redness.   Prior CV Studies Reviewed: The following studies were reviewed today: Stress echo IMPRESSIONS   1. This is a negative stress echocardiogram for ischemia.  2. Excellent exercise capacity (11:47 min:s; 13.4 METS). Normal BP/HR  response to exercise.  3. This is a low risk study.   FINDINGS   Exam Protocol: The patient exercised on a treadmill according to a Bruce  protocol.    Patient Performance: The patient exercised for 11 minutes and 47 seconds,  achieving 13.4 METS. The maximum stage achieved was V of the Bruce  protocol. The baseline heart rate was 103 bpm. The heart rate at peak  stress was 187 bpm. The target heart rate  was calculated to be 158 bpm. The percentage of maximum predicted heart  rate achieved was 100.8 %. The baseline blood pressure was 152/90 mmHg.  The blood pressure at peak stress was 211/74 mmHg. The blood pressure  response was normal. The patient  developed fatigue during the stress exam. The patient's functional  capacity was excellent.    EKG: Resting EKG showed normal sinus rhythm with no abnormal findings. The  patient  developed no abnormal EKG findings during exercise.    Zio monitor The patient wore the monitor for 6 days 22 hours starting September 12, 2020. Indication: Palpitations.  The minimum heart rate was 53 bpm, maximum heart rate was 162 bpm, and average heart rate was 85 bpm. Predominant underlying rhythm was Sinus Rhythm.   Premature atrial complexes were rare less than 1%. Premature Ventricular complexes rare less than 1%.  No ventricular tachycardia, no pauses, No AV block, no supraventricular tachycardia and no atrial fibrillation present. 4 patient triggered events and 2 diary events noted: 1 associated with premature atrial complex, the remaining associated with sinus rhythm and sinus tachycardia.  Conclusion: Unremarkable study with no evidence of significant arrhythmia.  Past Medical History:  Diagnosis Date  . Adverse food reaction 04/28/2020  . Allergy   . Angio-edema   . Anxiety   . Asthma   . Chicken pox   . Constipation   . Diabetes mellitus type 1 (HCC)   . Dysphagia   . Dysthymia   . Eczema   . Elevated platelet count   . Fetal CPAM   . Folliculitis   . GERD (gastroesophageal reflux disease)   . Hashimoto's thyroiditis   . History of migraine   . Hyperlipidemia   . Insulin pump in place   . Left breast mass 01/14/2017   fibroadenoma- benign  . OCD (obsessive compulsive disorder)   . Orthodontics    permanent upper and lower retainers  . Preeclampsia 02/24/2020  .  PVD (posterior vitreous detachment), right   . Rectal bleeding   . Rectal fissure   . Tinnitus   . Urticaria   . Wears contact lenses     Past Surgical History:  Procedure Laterality Date  . DENTAL SURGERY    . ENDOSCOPIC CONCHA BULLOSA RESECTION Bilateral 07/24/2018   Procedure: ENDOSCOPIC CONCHA BULLOSA MIDDLE TURBINATE;  Surgeon: Vernie Murders, MD;  Location: [facility];  Service: ENT;  Laterality: Bilateral;  Diabetic - insulin pump  . SEPTOPLASTY N/A 07/24/2018    Procedure: SEPTOPLASTY;  Surgeon: Vernie Murders, MD;  Location: [facility];  Service: ENT;  Laterality: N/A;  NEEDS TO BE FIRST INSULIN PUMP  . TURBINATE REDUCTION Bilateral 07/24/2018   Procedure: INFERIOR TURBINATE REDUCTION;  Surgeon: Vernie Murders, MD;  Location: [facility];  Service: ENT;  Laterality: Bilateral;  . WISDOM TOOTH EXTRACTION        OB History    Gravida  2   Para  1   Term  1   Preterm      AB      Living  1     SAB      IAB      Ectopic      Multiple  0   Live Births  1               Current Medications: Current Meds  Medication Sig  . albuterol (VENTOLIN HFA) 108 (90 Base) MCG/ACT inhaler Inhale 2 puffs into the lungs every 4 (four) hours as needed for wheezing or shortness of breath.  . cetirizine (ZYRTEC) 10 MG tablet Take 10 mg by mouth 2 (two) times daily.   . Continuous Blood Gluc Sensor (DEXCOM G6 SENSOR) MISC 1 Device by Does not apply route as directed.  . Continuous Blood Gluc Transmit (DEXCOM G6 TRANSMITTER) MISC 1 Device by Does not apply route as directed.  Marland Kitchen EPINEPHrine (AUVI-Q) 0.3 mg/0.3 mL IJ SOAJ injection Inject 0.3 mLs (0.3 mg total) into the muscle as needed for anaphylaxis.  . fluticasone furoate-vilanterol (BREO ELLIPTA) 100-25 MCG/INH AEPB Inhale 1 puff into the lungs daily. Rinse mouth after each use.  Marland Kitchen Glucagon 1 MG/0.2ML SOAJ Inject 1 mg into the skin as directed.  Marland Kitchen HUMALOG 100 UNIT/ML injection USE 40 MAX UNITS DAILY IN INSULIN PUMP  . Microlet Lancets MISC   . montelukast (SINGULAIR) 10 MG tablet TAKE 1 TABLET BY MOUTH EVERY DAY  . NON FORMULARY Continuous glucose monitor  . Prenatal Vit-Fe Fumarate-FA (PRENATAL MULTIVITAMIN) TABS tablet Take 1 tablet by mouth daily at 12 noon.     Allergies:   Molds & smuts, Shellfish allergy, Acetaminophen, Cephalexin, Pneumococcal vaccines, Prednisone, Soy allergy, Adhesive [tape], Dust mite extract, and Peanut-containing drug products   Social History    Socioeconomic History  . Marital status: Married    Spouse name: Not on file  . Number of children: Not on file  . Years of education: Not on file  . Highest education level: Not on file  Occupational History  . Not on file  Tobacco Use  . Smoking status: Never Smoker  . Smokeless tobacco: Never Used  Vaping Use  . Vaping Use: Never used  Substance and Sexual Activity  . Alcohol use: Not Currently    Alcohol/week: 1.0 standard drink    Types: 1 Glasses of wine per week  . Drug use: Not Currently  . Sexual activity: Yes    Partners: Male    Birth control/protection: None  Other Topics Concern  . Not on file  Social History Narrative   Marital status/children/pets: Married. Soon to have 1 son.    Education/employment: MSN, Nurse.    Safety:      -smoke alarm in the home:Yes     - wears seatbelt: Yes     - Feels safe in their relationships: Yes   Social Determinants of Health   Financial Resource Strain: Not on file  Food Insecurity: Unknown  . Worried About Programme researcher, broadcasting/film/videounning Out of Food in the Last Year: Never true  . Ran Out of Food in the Last Year: Not on file  Transportation Needs: Not on file  Physical Activity: Not on file  Stress: Not on file  Social Connections: Not on file      Family History  Problem Relation Age of Onset  . Arthritis Mother   . Stroke Mother   . Miscarriages / [place]birth[place] Mother   . Arthritis Father   . Dementia Maternal Grandmother   . Emphysema Maternal Grandfather       ROS:   Please see the history of present illness.    Review of Systems  Constitution: Negative for decreased appetite, fever and weight gain.  HENT: Negative for congestion, ear discharge, hoarse voice and sore throat.   Eyes: Negative for discharge, redness, vision loss in right eye and visual halos.  Cardiovascular: Negative for chest pain, dyspnea on exertion, leg swelling, orthopnea and palpitations.  Respiratory: Negative for cough, hemoptysis, shortness of  breath and snoring.   Endocrine: Negative for heat intolerance and polyphagia.  Hematologic/Lymphatic: Negative for bleeding problem. Does not bruise/bleed easily.  Skin: Negative for flushing, nail changes, rash and suspicious lesions.  Musculoskeletal: Bilateral leg pain.  Negative for arthritis, joint pain, muscle cramps, myalgias, neck pain and stiffness.  Gastrointestinal: Negative for abdominal pain, bowel incontinence, diarrhea and excessive appetite.  Genitourinary: Negative for decreased libido, genital sores and incomplete emptying.  Neurological: Negative for brief paralysis, focal weakness, headaches and loss of balance.  Psychiatric/Behavioral: Negative for altered mental status, depression and suicidal ideas.  Allergic/Immunologic: Negative for HIV exposure and persistent infections.     Labs/EKG Reviewed:    EKG:   EKG is was not ordered today.    Recent Labs: 03/07/2021: Hemoglobin 13.5; Platelets 441.0 04/05/2021: ALT 15; BUN 14; Creatinine, Ser 0.69; Potassium 4.2; Sodium 135; TSH 1.18   Recent Lipid Panel No results found for: CHOL, TRIG, HDL, CHOLHDL, LDLCALC, LDLDIRECT  Physical Exam:    VS:  BP 124/86   Pulse 88   Ht 5\' 2"  (1.575 m)   Wt 108 lb 1.9 oz (49 kg)   LMP 03/01/2021 (Exact Date)   SpO2 98%   BMI 19.78 kg/m     Wt Readings from Last 3 Encounters:  04/24/21 108 lb 1.9 oz (49 kg)  04/05/21 102 lb 4 oz (46.4 kg)  04/03/21 100 lb (45.4 kg)     GEN: Well nourished, well developed in no acute distress HEENT: Normal NECK: No JVD; No carotid bruits LYMPHATICS: No lymphadenopathy CARDIAC: RRR, no murmurs, rubs, gallops RESPIRATORY:  Clear to auscultation without rales, wheezing or rhonchi  ABDOMEN: Soft, non-tender, non-distended MUSCULOSKELETAL:  No edema; No deformity  SKIN: Warm and dry NEUROLOGIC:  Alert and oriented x 3 PSYCHIATRIC:  Normal affect    Risk Assessment/Risk Calculators:     CARPREG II Risk Prediction Index Score:  1.   The patient's risk for a primary cardiac event is 5%.  ASSESSMENT & PLAN:    History of postpartum hypertension History of preeclampsia Type 1 diabetes uses insulin pump High risk pregnancy Hyperlipidemia Bilateral leg pain  We talked today about her risk with her second pregnancy.  Given her history of preeclampsia as well as postpartum hypertension and diabetes.  Her calculated cardioprotective score is 1 making her primary cardiac event risk 5%.  While this is low the patient with a history of preeclampsia and postpartum hypertension is still at risk for cardiovascular complications during this pregnancy.  We will follow her closely.  She is 7 weeks today and we discussed starting low dose aspirin 81 mg daily for helping with prevention for preeclampsia at 12 weeks..  With her leg pain which is intermittent for bilateral and her recurrent hypercoagulable state we will get an ultrasound of her lower extremity bilaterally to rule out DVT.  She follows with her OB/GYN at [facility] where she intends to deliver.  She will continue to take her blood pressure daily.  Currently she is not on any antihypertensive medications but continue to monitor is necessary especially as she moves into her second trimester to make sure that gestational hypertension if develop is treated.  She will follow-up in 12 weeks.  Dispo:  No follow-ups on file.   Medication Adjustments/Labs and Tests Ordered: Current medicines are reviewed at length with the patient today.  Concerns regarding medicines are outlined above.  Tests Ordered: Orders Placed This Encounter  Procedures  . VAS [place] LOWER EXTREMITY ARTERIAL DUPLEX   Medication Changes: No orders of the defined types were placed in this encounter.

## 2021-04-27 ENCOUNTER — Other Ambulatory Visit: Payer: Self-pay

## 2021-04-27 DIAGNOSIS — M79605 Pain in left leg: Secondary | ICD-10-CM

## 2021-04-27 DIAGNOSIS — M79604 Pain in right leg: Secondary | ICD-10-CM

## 2021-04-28 ENCOUNTER — Telehealth: Payer: Self-pay | Admitting: Cardiology

## 2021-04-28 ENCOUNTER — Other Ambulatory Visit: Payer: Self-pay

## 2021-04-28 DIAGNOSIS — Z7689 Persons encountering health services in other specified circumstances: Secondary | ICD-10-CM

## 2021-04-28 NOTE — Telephone Encounter (Signed)
New message    Dartha Lodge is the vascular scheduler at [facility].  She says that this patient is scheduled for a LE arterial doppler on 05-05-21.  After reading the office notes, Dr Servando Salina wanted patient to have a LE venous doppler.  Please change order from arterial to venous per office notes.  Also, please let her know if the venous doppler is ASAP so that she can reschedule patient to an earlier appt.

## 2021-04-28 NOTE — Telephone Encounter (Signed)
Please change the order to lower extremities US dopplers venous - r/o DVT

## 2021-04-28 NOTE — Progress Notes (Signed)
Order placed

## 2021-04-28 NOTE — Telephone Encounter (Signed)
The order has been changed.

## 2021-05-05 ENCOUNTER — Other Ambulatory Visit: Payer: Self-pay

## 2021-05-05 ENCOUNTER — Ambulatory Visit (HOSPITAL_COMMUNITY)
Admission: RE | Admit: 2021-05-05 | Discharge: 2021-05-05 | Disposition: A | Discharge status: Home / Self Care | Payer: Managed Care, Other (non HMO) | Source: Ambulatory Visit | Attending: Cardiovascular Disease | Admitting: Cardiovascular Disease

## 2021-05-05 DIAGNOSIS — M79661 Pain in right lower leg: Secondary | ICD-10-CM | POA: Diagnosis not present

## 2021-05-05 DIAGNOSIS — M79662 Pain in left lower leg: Secondary | ICD-10-CM | POA: Diagnosis not present

## 2021-05-05 DIAGNOSIS — Z7689 Persons encountering health services in other specified circumstances: Secondary | ICD-10-CM | POA: Insufficient documentation

## 2021-05-06 ENCOUNTER — Encounter: Payer: Self-pay | Admitting: Family Medicine

## 2021-05-11 ENCOUNTER — Telehealth: Payer: Self-pay | Admitting: Internal Medicine

## 2021-05-11 NOTE — Telephone Encounter (Signed)
New message    Paperwork submitted on 5.27.22 for MD to review / filled out / completed / fax back   4 pages sent over   Fax # [phone]

## 2021-05-11 NOTE — Telephone Encounter (Signed)
Please advise patient if we order the lab for her TB screening we would have to have an appointment to do so for her since it is a blood draw. I could possibly see her virtually for her screening and then we can schedule a lab date for her to come in.  Or she can come in for provider appointment and we can collect her labs that day.       If she asks, we do all of her labs in-house in do not write prescriptions to have labs collected elsewhere.  Thinks

## 2021-05-12 NOTE — Telephone Encounter (Signed)
There are open slots on both days that are not same-day appointments.  Please place her one of those. 6/6 at 10 AM Or  6/7 at 1 p.m.

## 2021-05-12 NOTE — Telephone Encounter (Signed)
Noted. Will check for paperwork

## 2021-05-12 NOTE — Telephone Encounter (Signed)
Patient called to schedule appt --- preferably Monday or Tuesday of this coming week. 6/6 or 6/7.  Dr. Claiborne Billings only has "same day" appts slots left at 11:30am.  Can patient be scheduled in one of those slots? She is needing the TB test done asap.  Thank you.  Patient can be reached at [phone]

## 2021-05-12 NOTE — Telephone Encounter (Signed)
Called pt and informed her of cancellation. Pt agreed to appt

## 2021-05-16 ENCOUNTER — Other Ambulatory Visit: Payer: Self-pay

## 2021-05-16 ENCOUNTER — Telehealth: Payer: Self-pay

## 2021-05-16 ENCOUNTER — Encounter: Payer: Self-pay | Admitting: Family Medicine

## 2021-05-16 ENCOUNTER — Ambulatory Visit (INDEPENDENT_AMBULATORY_CARE_PROVIDER_SITE_OTHER): Payer: Managed Care, Other (non HMO) | Admitting: Family Medicine

## 2021-05-16 VITALS — BP 104/62 | HR 89 | Temp 98.6°F | Wt 109.0 lb

## 2021-05-16 DIAGNOSIS — Z0000 Encounter for general adult medical examination without abnormal findings: Secondary | ICD-10-CM | POA: Diagnosis not present

## 2021-05-16 DIAGNOSIS — Z111 Encounter for screening for respiratory tuberculosis: Secondary | ICD-10-CM

## 2021-05-16 DIAGNOSIS — Z1159 Encounter for screening for other viral diseases: Secondary | ICD-10-CM

## 2021-05-16 DIAGNOSIS — Z13220 Encounter for screening for lipoid disorders: Secondary | ICD-10-CM | POA: Diagnosis not present

## 2021-05-16 NOTE — Patient Instructions (Signed)
Health Maintenance, Female Adopting a healthy lifestyle and getting preventive care are important in promoting health and wellness. Ask your health care provider about:  The right schedule for you to have regular tests and exams.  Things you can do on your own to prevent diseases and keep yourself healthy. What should I know about diet, weight, and exercise? Eat a healthy diet  Eat a diet that includes plenty of vegetables, fruits, low-fat dairy products, and lean protein.  Do not eat a lot of foods that are high in solid fats, added sugars, or sodium.   Maintain a healthy weight Body mass index (BMI) is used to identify weight problems. It estimates body fat based on height and weight. Your health care provider can help determine your BMI and help you achieve or maintain a healthy weight. Get regular exercise Get regular exercise. This is one of the most important things you can do for your health. Most adults should:  Exercise for at least 150 minutes each week. The exercise should increase your heart rate and make you sweat (moderate-intensity exercise).  Do strengthening exercises at least twice a week. This is in addition to the moderate-intensity exercise.  Spend less time sitting. Even light physical activity can be beneficial. Watch cholesterol and blood lipids Have your blood tested for lipids and cholesterol at 35 years of age, then have this test every 5 years. Have your cholesterol levels checked more often if:  Your lipid or cholesterol levels are high.  You are older than 35 years of age.  You are at high risk for heart disease. What should I know about cancer screening? Depending on your health history and family history, you may need to have cancer screening at various ages. This may include screening for:  Breast cancer.  Cervical cancer.  Colorectal cancer.  Skin cancer.  Lung cancer. What should I know about heart disease, diabetes, and high blood  pressure? Blood pressure and heart disease  High blood pressure causes heart disease and increases the risk of stroke. This is more likely to develop in people who have high blood pressure readings, are of African descent, or are overweight.  Have your blood pressure checked: ? Every 3-5 years if you are 18-39 years of age. ? Every year if you are 40 years old or older. Diabetes Have regular diabetes screenings. This checks your fasting blood sugar level. Have the screening done:  Once every three years after age 40 if you are at a normal weight and have a low risk for diabetes.  More often and at a younger age if you are overweight or have a high risk for diabetes. What should I know about preventing infection? Hepatitis B If you have a higher risk for hepatitis B, you should be screened for this virus. Talk with your health care provider to find out if you are at risk for hepatitis B infection. Hepatitis C Testing is recommended for:  Everyone born from 1945 through 1965.  Anyone with known risk factors for hepatitis C. Sexually transmitted infections (STIs)  Get screened for STIs, including gonorrhea and chlamydia, if: ? You are sexually active and are younger than 35 years of age. ? You are older than 35 years of age and your health care provider tells you that you are at risk for this type of infection. ? Your sexual activity has changed since you were last screened, and you are at increased risk for chlamydia or gonorrhea. Ask your health care provider   if you are at risk.  Ask your health care provider about whether you are at high risk for HIV. Your health care provider may recommend a prescription medicine to help prevent HIV infection. If you choose to take medicine to prevent HIV, you should first get tested for HIV. You should then be tested every 3 months for as long as you are taking the medicine. Pregnancy  If you are about to stop having your period (premenopausal) and  you may become pregnant, seek counseling before you get pregnant.  Take 400 to 800 micrograms (mcg) of folic acid every day if you become pregnant.  Ask for birth control (contraception) if you want to prevent pregnancy. Osteoporosis and menopause Osteoporosis is a disease in which the bones lose minerals and strength with aging. This can result in bone fractures. If you are 65 years old or older, or if you are at risk for osteoporosis and fractures, ask your health care provider if you should:  Be screened for bone loss.  Take a calcium or vitamin D supplement to lower your risk of fractures.  Be given hormone replacement therapy (HRT) to treat symptoms of menopause. Follow these instructions at home: Lifestyle  Do not use any products that contain nicotine or tobacco, such as cigarettes, e-cigarettes, and chewing tobacco. If you need help quitting, ask your health care provider.  Do not use street drugs.  Do not share needles.  Ask your health care provider for help if you need support or information about quitting drugs. Alcohol use  Do not drink alcohol if: ? Your health care provider tells you not to drink. ? You are pregnant, may be pregnant, or are planning to become pregnant.  If you drink alcohol: ? Limit how much you use to 0-1 drink a day. ? Limit intake if you are breastfeeding.  Be aware of how much alcohol is in your drink. In the [place] one drink equals one 12 oz bottle of beer (355 mL), one 5 oz glass of wine (148 mL), or one 1 oz glass of hard liquor (44 mL). General instructions  Schedule regular health, dental, and eye exams.  Stay current with your vaccines.  Tell your health care provider if: ? You often feel depressed. ? You have ever been abused or do not feel safe at home. Summary  Adopting a healthy lifestyle and getting preventive care are important in promoting health and wellness.  Follow your health care provider's instructions about healthy  diet, exercising, and getting tested or screened for diseases.  Follow your health care provider's instructions on monitoring your cholesterol and blood pressure. This information is not intended to replace advice given to you by your health care provider. Make sure you discuss any questions you have with your health care provider. Document Revised: 11/19/2018 Document Reviewed: 11/19/2018 Elsevier Patient Education  2021 [employer]  

## 2021-05-16 NOTE — Progress Notes (Signed)
This visit occurred during the SARS-CoV-2 public health emergency.  Safety protocols were in place, including screening questions prior to the visit, additional usage of staff PPE, and extensive cleaning of exam room while observing appropriate contact time as indicated for disinfecting solutions.    Patient ID: Nicole Carrillo, female  DOB: 25-Oct-1986, 35 y.o.   MRN: [id number] Patient Care Team    Relationship Specialty Notifications Start End  Natalia Leatherwood, DO PCP - General Family Medicine  02/22/20   Thomasene Ripple, DO PCP - Cardiology Cardiology  09/12/20   Shamleffer, Konrad Dolores, MD Consulting Physician Endocrinology  04/26/20   Ellamae Sia, DO Consulting Physician Allergy  04/28/20   Magda Kiel, MD  Obstetrics and Gynecology  03/07/21     Chief Complaint  Patient presents with  . Annual Exam    Pt is not fasting    Subjective: Nicole Carrillo is a 35 y.o.  Female  present for CPE and TB screen. All past medical history, surgical history, allergies, family history, immunizations, medications and social history were updated in the electronic medical record today. All recent labs, ED visits and hospitalizations within the last year were reviewed.  Health maintenance:  Colonoscopy:no fhx- routine screen 45  Mammogram:no fhx- routine screen 40 Cervical cancer screening:UTD w/ gyn Immunizations: tdap UTD 2021, Influenza  (encouraged yearly), PNA should consider being a DM. covid UTD Infectious disease screening: HIV completed, Hep C completed today DEXA:rotuine visit.  Assistive device: none Oxygen use: none Patient has a Dental home. Hospitalizations/ED visits: reviewed  Depression screen [facility] 2/9 05/16/2021 04/03/2021 03/07/2021 02/22/2020 02/17/2020  Decreased Interest 0 0 0 0 0  Down, Depressed, Hopeless 0 0 0 0 0  PHQ - 2 Score 0 0 0 0 0  Altered sleeping 0 - 0 - 0  Tired, decreased energy 0 - 0 - 0  Change in appetite 0 - 0 - 0  Feeling bad or failure about yourself   0 - 0 - 0  Trouble concentrating 0 - 0 - 0  Moving slowly or fidgety/restless 0 - 0 - 0  Suicidal thoughts 0 - 0 - 0  PHQ-9 Score 0 - 0 - 0  Difficult doing work/chores - - - - Not difficult at all   GAD 7 : Generalized Anxiety Score 02/17/2020 02/10/2020 01/14/2020 12/30/2019  Nervous, Anxious, on Edge 0 0 0 0  Control/stop worrying 0 0 0 0  Worry too much - different things 0 0 0 0  Trouble relaxing 0 0 0 0  Restless 0 0 0 0  Easily annoyed or irritable 0 0 0 0  Afraid - awful might happen 0 0 0 0  Total GAD 7 Score 0 0 0 0    Immunization History  Administered Date(s) Administered  . Influenza,inj,Quad PF,6+ Mos 08/25/2020  . PFIZER(Purple Top)SARS-COV-2 Vaccination 03/31/2020, 04/25/2020, 12/29/2020  . Tdap 12/17/2019    Past Medical History:  Diagnosis Date  . Adverse food reaction 04/28/2020  . Allergy   . Angio-edema   . Anxiety   . Asthma   . Chicken pox   . Constipation   . Diabetes mellitus type 1 (HCC)   . Dysphagia   . Dysthymia   . Eczema   . Elevated platelet count   . Fetal CPAM   . Folliculitis   . GERD (gastroesophageal reflux disease)   . Hashimoto's thyroiditis   . History of migraine   . Hyperlipidemia   . Insulin pump in place   .  Left breast mass 01/14/2017   fibroadenoma- benign  . OCD (obsessive compulsive disorder)   . Orthodontics    permanent upper and lower retainers  . Preeclampsia 02/24/2020  . PVD (posterior vitreous detachment), right   . Rectal bleeding   . Rectal fissure   . Tinnitus   . Urticaria   . Wears contact lenses    Allergies  Allergen Reactions  . Molds & Smuts Itching and Shortness Of Breath  . Shellfish Allergy Shortness Of Breath and Rash    Crab causes SOB, Scallops cause rash.  Can eat Shrimp and lobster.  . Acetaminophen Other (See Comments)    Effects blood sugars and insulin pump.  . Cephalexin Nausea Only and Other (See Comments)    Headaches, dizziness  . Pneumococcal Vaccines Swelling    Local  injection swelling  . Prednisone Nausea Only and Other (See Comments)    Headaches, dizziness, Prefers not to take because of uncontrollable BG's on this med;     Marland Kitchen Soy Allergy Other (See Comments)    Blood test  . Adhesive [Tape] Rash    With extended exposure  . Dust Mite Extract Rash  . Peanut-Containing Drug Products Rash   Past Surgical History:  Procedure Laterality Date  . DENTAL SURGERY    . ENDOSCOPIC CONCHA BULLOSA RESECTION Bilateral 07/24/2018   Procedure: ENDOSCOPIC CONCHA BULLOSA MIDDLE TURBINATE;  Surgeon: Vernie Murders, MD;  Location: [facility];  Service: ENT;  Laterality: Bilateral;  Diabetic - insulin pump  . SEPTOPLASTY N/A 07/24/2018   Procedure: SEPTOPLASTY;  Surgeon: Vernie Murders, MD;  Location: [facility];  Service: ENT;  Laterality: N/A;  NEEDS TO BE FIRST INSULIN PUMP  . TURBINATE REDUCTION Bilateral 07/24/2018   Procedure: INFERIOR TURBINATE REDUCTION;  Surgeon: Vernie Murders, MD;  Location: [facility];  Service: ENT;  Laterality: Bilateral;  . WISDOM TOOTH EXTRACTION     Family History  Problem Relation Age of Onset  . Arthritis Mother   . Stroke Mother   . Miscarriages / [place] Mother   . Arthritis Father   . Dementia Maternal Grandmother   . Emphysema Maternal Grandfather    Social History   Social History Narrative   Marital status/children/pets: Married. Soon to have 1 son.    Education/employment: MSN, Nurse.    Safety:      -smoke alarm in the home:Yes     - wears seatbelt: Yes     - Feels safe in their relationships: Yes    Allergies as of 05/16/2021      Reactions   Molds & Smuts Itching, Shortness Of Breath   Shellfish Allergy Shortness Of Breath, Rash   Crab causes SOB, Scallops cause rash.  Can eat Shrimp and lobster.   Acetaminophen Other (See Comments)   Effects blood sugars and insulin pump.   Cephalexin Nausea Only, Other (See Comments)   Headaches, dizziness   Pneumococcal Vaccines Swelling    Local injection swelling   Prednisone Nausea Only, Other (See Comments)   Headaches, dizziness, Prefers not to take because of uncontrollable BG's on this med;    Soy Allergy Other (See Comments)   Blood test   Adhesive [tape] Rash   With extended exposure   Dust Mite Extract Rash   Peanut-containing Drug Products Rash      Medication List       Accurate as of May 16, 2021  1:34 PM. If you have any questions, ask your nurse or doctor.  albuterol 108 (90 Base) MCG/ACT inhaler Commonly known as: VENTOLIN HFA Inhale 2 puffs into the lungs every 4 (four) hours as needed for wheezing or shortness of breath.   Breo Ellipta 100-25 MCG/INH Aepb Generic drug: fluticasone furoate-vilanterol Inhale 1 puff into the lungs daily. Rinse mouth after each use.   cetirizine 10 MG tablet Commonly known as: ZYRTEC Take 10 mg by mouth 2 (two) times daily.   Dexcom G6 Sensor Misc 1 Device by Does not apply route as directed.   Dexcom G6 Transmitter Misc 1 Device by Does not apply route as directed.   EPINEPHrine 0.3 mg/0.3 mL Soaj injection Commonly known as: Auvi-Q Inject 0.3 mLs (0.3 mg total) into the muscle as needed for anaphylaxis.   Glucagon 1 MG/0.2ML Soaj Inject 1 mg into the skin as directed.   HumaLOG 100 UNIT/ML injection Generic drug: insulin lispro USE 40 MAX UNITS DAILY IN INSULIN PUMP   Microlet Lancets Misc   montelukast 10 MG tablet Commonly known as: SINGULAIR TAKE 1 TABLET BY MOUTH EVERY DAY   NON FORMULARY Continuous glucose monitor   prenatal multivitamin Tabs tablet Take 1 tablet by mouth daily at 12 noon.       All past medical history, surgical history, allergies, family history, immunizations andmedications were updated in the EMR today and reviewed under the history and medication portions of their EMR.      ROS: 14 pt review of systems performed and negative (unless mentioned in an HPI)  Objective: BP 104/62   Pulse 89   Temp 98.6 F  (37 C) (Oral)   Wt 109 lb (49.4 kg)   LMP 03/01/2021 (Exact Date)   SpO2 99%   BMI 19.94 kg/m  Gen: Afebrile. No acute distress. Nontoxic in appearance, well-developed, well-nourished,  Very pleasant female.  HENT: AT. [place]. Bilateral TM visualized and normal in appearance, normal external auditory canal. MMM, no oral lesions, adequate dentition. Bilateral nares within normal limits. Throat without erythema, ulcerations or exudates. no Cough on exam, no hoarseness on exam. Eyes:Pupils Equal Round Reactive to light, Extraocular movements intact,  Conjunctiva without redness, discharge or icterus. Neck/lymp/endocrine: Supple,no lymphadenopathy, no thyromegaly CV: RRR no murmur, no edema, +2/4 P posterior tibialis pulses.  Chest: CTAB, no wheeze, rhonchi or crackles. normal Respiratory effort. good Air movement. Abd: Soft. flat. NTND. BS present. no Masses palpated. No hepatosplenomegaly. No rebound tenderness or guarding. Skin: no rashes, purpura or petechiae. Warm and well-perfused. Skin intact. Neuro/Msk:  Normal gait. PERLA. EOMi. Alert. Oriented x3.  Cranial nerves II through XII intact. Muscle strength 5/5 upper/lower extremity. DTRs equal bilaterally. Psych: Normal affect, dress and demeanor. Normal speech. Normal thought content and judgment.  No exam data present  Assessment/plan: Nicole Carrillo is a 35 y.o. female present for CPE Need for hepatitis C screening test - Hepatitis C Antibody Screening-pulmonary TB - need for school.  - QuantiFERON-TB Gold Plus Lipid screening Tight control being a diabetic.  - Lipid panel Routine general medical examination at a health care facility Colonoscopy:no fhx- routine screen 45  Mammogram:no fhx- routine screen 40 Cervical cancer screening:UTD w/ gyn Immunizations: tdap UTD 2021, Influenza  (encouraged yearly), PNA should consider being a DM. covid UTD Infectious disease screening: HIV completed, Hep C completed today DEXA:rotuine  visit Patient was encouraged to exercise greater than 150 minutes a week. Patient was encouraged to choose a diet filled with fresh fruits and vegetables, and lean meats. AVS provided to patient today for education/recommendation on gender specific health and  safety maintenance.   1 yr cpe  Orders Placed This Encounter  Procedures  . Lipid panel  . QuantiFERON-TB Gold Plus  . Hepatitis C Antibody   No orders of the defined types were placed in this encounter.  Referral Orders  No referral(s) requested today     Electronically signed by: Felix Pacini, DO [facility] [place]

## 2021-05-16 NOTE — Telephone Encounter (Signed)
-----   Message from Felecia Jan, RN sent at 05/05/2021  2:56 PM EDT -----  ----- Message ----- From: Baldo Daub, MD Sent: 05/05/2021   2:54 PM EDT To: Mickie Bail Ash/Hp Triage  Good result, normal duplex no findings of DVT

## 2021-05-16 NOTE — Telephone Encounter (Signed)
Spoke with patient regarding results and recommendation.  Patient verbalizes understanding and is agreeable to plan of care. Advised patient to call back with any issues or concerns.  

## 2021-05-17 LAB — LIPID PANEL
Cholesterol: 160 mg/dL (ref 0–200)
HDL: 108.2 mg/dL (ref >39.00)
LDL Cholesterol: 30 mg/dL (ref 0–99)
NonHDL: 51.3
Total CHOL/HDL Ratio: 1
Triglycerides: 109 mg/dL (ref 0.0–149.0)
VLDL: 21.8 mg/dL (ref 0.0–40.0)

## 2021-05-18 ENCOUNTER — Other Ambulatory Visit: Payer: Self-pay

## 2021-05-18 ENCOUNTER — Other Ambulatory Visit: Payer: Self-pay | Admitting: Allergy

## 2021-05-18 ENCOUNTER — Ambulatory Visit (INDEPENDENT_AMBULATORY_CARE_PROVIDER_SITE_OTHER): Payer: Managed Care, Other (non HMO)

## 2021-05-18 DIAGNOSIS — Z111 Encounter for screening for respiratory tuberculosis: Secondary | ICD-10-CM

## 2021-05-18 NOTE — Progress Notes (Signed)
Pt came to have labs redrawn today for lab issue

## 2021-05-19 ENCOUNTER — Telehealth: Payer: Self-pay | Admitting: Family Medicine

## 2021-05-19 NOTE — Telephone Encounter (Signed)
[facility]oke with [facility] and her tb is in process of being resulted.

## 2021-05-19 NOTE — Telephone Encounter (Signed)
Erica[facility] [phone]) is calling to speak to the lab regarding patient. Pleas give her a call back.

## 2021-05-20 LAB — HEPATITIS C ANTIBODY
Hepatitis C Ab: NONREACTIVE
SIGNAL TO CUT-OFF: 0.01 (ref <1.00)

## 2021-05-20 LAB — QUANTIFERON-TB GOLD PLUS
Mitogen-NIL: 9.68 IU/mL
NIL: 0.04 IU/mL
QuantiFERON-TB Gold Plus: NEGATIVE
TB1-NIL: 0.03 IU/mL
TB2-NIL: 0.02 IU/mL

## 2021-05-20 LAB — EXTRA SPECIMEN

## 2021-05-23 NOTE — Telephone Encounter (Signed)
Nikki with [employer] called to check the status of the fax below. She states she had sent it on 5/27 and 6/2 with no response. I confirmed she had the right fax #.

## 2021-05-24 NOTE — Telephone Encounter (Signed)
Called and spoke to lynn ([employer])--check the status but cannot locate information needed and routed the call to different department, which was on hold for 45 minutes.

## 2021-05-26 NOTE — Telephone Encounter (Signed)
Niki from [employer] is calling regarding paper work- niki voiced that this number would be best to reach out.   Mardene Celeste  [place]-[phone] ext [phone]

## 2021-05-29 NOTE — Telephone Encounter (Signed)
Message left on voice mail after being on hold for 25 minutes, to refax paperwork to [phone], and to call me to confirm that it was refaxed.

## 2021-05-30 ENCOUNTER — Ambulatory Visit: Payer: Managed Care, Other (non HMO) | Admitting: Allergy

## 2021-05-30 ENCOUNTER — Encounter: Payer: Self-pay | Admitting: Allergy

## 2021-05-30 ENCOUNTER — Other Ambulatory Visit: Payer: Self-pay

## 2021-05-30 VITALS — BP 110/68 | HR 82 | Resp 17

## 2021-05-30 DIAGNOSIS — H1010 Acute atopic conjunctivitis, unspecified eye: Secondary | ICD-10-CM

## 2021-05-30 DIAGNOSIS — J302 Other seasonal allergic rhinitis: Secondary | ICD-10-CM

## 2021-05-30 DIAGNOSIS — J4540 Moderate persistent asthma, uncomplicated: Secondary | ICD-10-CM | POA: Diagnosis not present

## 2021-05-30 DIAGNOSIS — J3089 Other allergic rhinitis: Secondary | ICD-10-CM

## 2021-05-30 DIAGNOSIS — H1013 Acute atopic conjunctivitis, bilateral: Secondary | ICD-10-CM

## 2021-05-30 DIAGNOSIS — L503 Dermatographic urticaria: Secondary | ICD-10-CM | POA: Diagnosis not present

## 2021-05-30 DIAGNOSIS — R12 Heartburn: Secondary | ICD-10-CM | POA: Diagnosis not present

## 2021-05-30 DIAGNOSIS — Z3490 Encounter for supervision of normal pregnancy, unspecified, unspecified trimester: Secondary | ICD-10-CM

## 2021-05-30 DIAGNOSIS — T781XXD Other adverse food reactions, not elsewhere classified, subsequent encounter: Secondary | ICD-10-CM

## 2021-05-30 MED ORDER — BUDESONIDE-FORMOTEROL FUMARATE 80-4.5 MCG/ACT IN AERO: 2.0000 | INHALATION_SPRAY | Freq: Two times a day (BID) | RESPIRATORY_TRACT | 5 refills | Status: DC

## 2021-05-30 MED ORDER — ALBUTEROL SULFATE HFA 108 (90 BASE) MCG/ACT IN AERS: 2.0000 | INHALATION_SPRAY | RESPIRATORY_TRACT | 1 refills | Status: DC | PRN

## 2021-05-30 MED ORDER — EPINEPHRINE 0.3 MG/0.3ML IJ SOAJ: 0.3000 mg | INTRAMUSCULAR | 2 refills | Status: DC | PRN

## 2021-05-30 NOTE — Assessment & Plan Note (Signed)
Past history - Perennial rhino conjunctivitis symptoms for the past 3 years. 2018 skin testing by ENT was positive to grass, ragweed, weed, trees, molds, dust mites, cockroach, horse. SLIT therapy by ENT x few months with no benefit. Deviated septum repair and turbinate reduction in 2019. 2021 bloodwork positive to dust mites, grass pollen, mold, tree pollen, ragweed pollen and weed pollen. Dymista caused nausea.  Interim history - stable with zyrtec 10mg  daily.  . Continue environmental control measures - grass, ragweed, weed, trees, molds, dust mites, cockroach, horse. . Continue with Zyrtec (cetirizine) 10mg  once a day.

## 2021-05-30 NOTE — Progress Notes (Signed)
Follow Up Note  RE: Nicole Carrillo MRN: [id number] DOB: 02/03/1986 Date of Office Visit: 05/30/2021  Referring provider: Natalia Leatherwood, DO Primary care provider: Natalia Leatherwood, DO  Chief Complaint: Asthma  History of Present Illness: I had the pleasure of seeing Nicole Carrillo for a follow up visit at the [facility] on 05/30/2021. She is a 35 y.o. female, who is being followed for asthma, allergic rhinoconjunctivitis, adverse food reaction and dermatographism. Her previous allergy office visit was on 01/17/2021 with Dr. Selena Batten. Today is a regular follow up visit.  Asthma Patient is [redacted] weeks pregnant and so far doing well.  Currently on Breo 1 puff once a day and Singulair 10mg  daily. No rescue inhaler use. No asthma flare during the last pregnancy.  Denies any ER/urgent care visits or prednisone use since the last visit.  Heartburn Not taking any medications for this. Chest tightness for the most part resolved.    Seasonal and perennial allergic rhinoconjunctivitis Currently taking zyrtec 10mg  once a day with good benefit.   Adverse food reaction Currently avoiding sesame, peanuts and tree nuts.   Dermatographism Stable with zyrtec.   Patient follows with MFM, endocrinology.   Assessment and Plan: Arasely is a 35 y.o. female with: Asthma Past history - Diagnosed with asthma in 2018 by pulmonology via  Methacholine challenge test - data not available for review. Tried Arnuity, Breo with no benefit. No triggers noted. Normal EGD in past per patient report. 2021 spirometry was normal with marginal improvement in FEV1 post bronchodilator treatment. Clinically feeling about the same. Interim history - well controlled with below regimen. Patient is [redacted] weeks pregnant.  Today's spirometry was normal. ACT score 25.  Daily controller medication(s): stop Singulair and stop Breo. Start Symbicort 2019 2 puffs twice a day with spacer and rinse mouth  afterwards - this is the preferred ICS/LABA during pregnancy.  If you notice worsening asthma/allergy symptoms then let 2022 know.  May use albuterol rescue inhaler 2 puffs every 4 to 6 hours as needed for shortness of breath, chest tightness, coughing, and wheezing. May use albuterol rescue inhaler 2 puffs 5 to 15 minutes prior to strenuous physical activities. Monitor frequency of use.   Seasonal and perennial allergic rhinoconjunctivitis Past history - Perennial rhino conjunctivitis symptoms for the past 3 years. 2018 skin testing by ENT was positive to grass, ragweed, weed, trees, molds, dust mites, cockroach, horse. SLIT therapy by ENT x few months with no benefit. Deviated septum repair and turbinate reduction in 2019. 2021 bloodwork positive to dust mites, grass pollen, mold, tree pollen, ragweed pollen and weed pollen. Dymista caused nausea.  Interim history - stable with zyrtec 10mg  daily.  Continue environmental control measures - grass, ragweed, weed, trees, molds, dust mites, cockroach, horse. Continue with Zyrtec (cetirizine) 10mg  once a day.   Adverse food reaction Past history - 2018 skin testing positive to soy, peanut and borderline to wheat. She had no prior clinical reactions to these foods. 2021 bloodwork borderline positive to hazelnut, peanut, macademia nut, pistachios and soy. Positive to sesame seed. Tolerates soy and wheat. Interim history - no reactions.  Continue to avoid sesame, peanuts and tree nuts. For mild symptoms you can take over the counter antihistamines such as Benadryl and monitor symptoms closely. If symptoms worsen or if you have severe symptoms including breathing issues, throat closure, significant swelling, whole body hives, severe diarrhea and vomiting, lightheadedness then inject epinephrine and seek immediate medical care afterwards.  Food action plan in place.  Consider food challenge to peanuts once done with pregnancy.  Dermatographism Stable with  zyrtec 10mg  daily. Continue medication as above.   Heartburn Okay to take famotidine 20mg  once a day during pregnancy as needed.  Return in about 2 months (around 07/30/2021).  Meds ordered this encounter  Medications   budesonide-formoterol (SYMBICORT) 80-4.5 MCG/ACT inhaler    Sig: Inhale 2 puffs into the lungs in the morning and at bedtime. with spacer and rinse mouth afterwards.    Dispense:  1 each    Refill:  5   EPINEPHrine (AUVI-Q) 0.3 mg/0.3 mL IJ SOAJ injection    Sig: Inject 0.3 mg into the muscle as needed for anaphylaxis.    Dispense:  1 each    Refill:  2    [phone] (M)   albuterol (VENTOLIN HFA) 108 (90 Base) MCG/ACT inhaler    Sig: Inhale 2 puffs into the lungs every 4 (four) hours as needed for wheezing or shortness of breath.    Dispense:  18 g    Refill:  1    Lab Orders  No laboratory test(s) ordered today    Diagnostics: None.  Medication List:  Current Outpatient Medications  Medication Sig Dispense Refill   budesonide-formoterol (SYMBICORT) 80-4.5 MCG/ACT inhaler Inhale 2 puffs into the lungs in the morning and at bedtime. with spacer and rinse mouth afterwards. 1 each 5   cetirizine (ZYRTEC) 10 MG tablet Take 10 mg by mouth daily as needed.     Continuous Blood Gluc Sensor (DEXCOM G6 SENSOR) MISC 1 Device by Does not apply route as directed. 9 each 3   Continuous Blood Gluc Transmit (DEXCOM G6 TRANSMITTER) MISC 1 Device by Does not apply route as directed. 1 each 3   [employer] B6 100 MG tablet Take 100 mg by mouth daily.     Glucagon 1 MG/0.2ML SOAJ Inject 1 mg into the skin as directed. 0.2 mL 1   HUMALOG 100 UNIT/ML injection USE 40 MAX UNITS DAILY IN INSULIN PUMP 20 mL 4   Microlet Lancets MISC      NON FORMULARY Continuous glucose monitor     Prenatal Vit-Fe Fumarate-FA (PRENATAL MULTIVITAMIN) TABS tablet Take 1 tablet by mouth daily at 12 noon.     terconazole (TERAZOL 7) 0.4 % vaginal cream Place 1 applicator vaginally at bedtime.      albuterol (VENTOLIN HFA) 108 (90 Base) MCG/ACT inhaler Inhale 2 puffs into the lungs every 4 (four) hours as needed for wheezing or shortness of breath. 18 g 1   EPINEPHrine (AUVI-Q) 0.3 mg/0.3 mL IJ SOAJ injection Inject 0.3 mg into the muscle as needed for anaphylaxis. 1 each 2   No current facility-administered medications for this visit.   Allergies: Allergies  Allergen Reactions   Molds & Smuts Itching and Shortness Of Breath   Shellfish Allergy Shortness Of Breath and Rash    Crab causes SOB, Scallops cause rash.  Can eat Shrimp and lobster.   Acetaminophen Other (See Comments)    Effects blood sugars and insulin pump.   Cephalexin Nausea Only and Other (See Comments)    Headaches, dizziness   Pneumococcal Vaccines Swelling    Local injection swelling   Prednisone Nausea Only and Other (See Comments)    Headaches, dizziness, Prefers not to take because of uncontrollable BG's on this med;      Soy Allergy Other (See Comments)    Blood test   Adhesive [Tape] Rash    With  extended exposure   Dust Mite Extract Rash   Peanut-Containing Drug Products Rash   I reviewed her past medical history, social history, family history, and environmental history and no significant changes have been reported from her previous visit.  Review of Systems  Constitutional:  Negative for appetite change, chills, fever and unexpected weight change.  HENT:  Negative for congestion and rhinorrhea.   Eyes:  Negative for itching.  Respiratory:  Negative for cough, chest tightness, shortness of breath and wheezing.   Cardiovascular:  Negative for chest pain.  Gastrointestinal:  Negative for abdominal pain.  Genitourinary:  Negative for difficulty urinating.  Skin:  Negative for rash.  Allergic/Immunologic: Positive for environmental allergies.  Neurological:  Negative for headaches.  Objective: BP 110/68   Pulse 82   Resp 17   LMP 03/01/2021 (Exact Date)   SpO2 98%  There is no height or weight  on file to calculate BMI. Physical Exam Vitals and nursing note reviewed.  Constitutional:      Appearance: Normal appearance. She is well-developed.  HENT:     Head: Normocephalic and atraumatic.     Right Ear: Tympanic membrane and external ear normal.     Left Ear: Tympanic membrane and external ear normal.     Nose: Nose normal.     Mouth/Throat:     Mouth: Mucous membranes are moist.     Pharynx: Oropharynx is clear.  Eyes:     Conjunctiva/sclera: Conjunctivae normal.  Cardiovascular:     Rate and Rhythm: Normal rate and regular rhythm.     Heart sounds: Normal heart sounds. No murmur heard. Pulmonary:     Effort: Pulmonary effort is normal.     Breath sounds: Normal breath sounds. No wheezing, rhonchi or rales.  Musculoskeletal:     Cervical back: Neck supple.  Skin:    General: Skin is warm.     Findings: No rash.  Neurological:     Mental Status: She is alert and oriented to person, place, and time.  Psychiatric:        Mood and Affect: Mood normal.        Behavior: Behavior normal.  Previous notes and tests were reviewed. The plan was reviewed with the patient/family, and all questions/concerned were addressed.  It was my pleasure to see Rubby today and participate in her care. Please feel free to contact me with any questions or concerns.  Sincerely,  Wyline Mood, DO Allergy & Immunology  [facility] office: [phone] [facility] office: [phone]

## 2021-05-30 NOTE — Patient Instructions (Addendum)
Asthma:  Daily controller medication(s): stop singulair and stop Breo. Start Symbicort 2 puffs twice a day with spacer and rinse mouth afterwards. If you notice worsening asthma/allergy symptoms then let us know.  May use albuterol rescue inhaler 2 puffs every 4 to 6 hours as needed for shortness of breath, chest tightness, coughing, and wheezing. May use albuterol rescue inhaler 2 puffs 5 to 15 minutes prior to strenuous physical activities. Monitor frequency of use.  Asthma control goals:  Full participation in all desired activities (may need albuterol before activity) Albuterol use two times or less a week on average (not counting use with activity) Cough interfering with sleep two times or less a month Oral steroids no more than once a year No hospitalizations  Environmental allergies: Continue environmental control measures - grass, ragweed, weed, trees, molds, dust mites, cockroach, horse. Continue with Zyrtec (cetirizine) 10mg  once a day.   Food allergies: Continue to avoid sesame, peanuts and tree nuts. For mild symptoms you can take over the counter antihistamines such as Benadryl and monitor symptoms closely. If symptoms worsen or if you have severe symptoms including breathing issues, throat closure, significant swelling, whole body hives, severe diarrhea and vomiting, lightheadedness then inject epinephrine and seek immediate medical care afterwards. Food action plan in place.   Skin  Continue proper skin care. Continue with Zyrtec (cetirizine) 10mg  once a day.   Heartburn Okay to take famotidine 20mg  once a day during pregnancy as needed.  Follow up in 2 months or sooner if needed.  Reducing Pollen Exposure Pollen seasons: trees (spring), grass (summer) and ragweed/weeds (fall). Keep windows closed in your home and car to lower pollen exposure.  Install air conditioning in the bedroom and throughout the house if possible.  Avoid going out in dry windy days -  especially early morning. Pollen counts are highest between 5 - 10 AM and on dry, hot and windy days.  Save outside activities for late afternoon or after a heavy rain, when pollen levels are lower.  Avoid mowing of grass if you have grass pollen allergy. Be aware that pollen can also be transported indoors on people and pets.  Dry your clothes in an automatic dryer rather than hanging them outside where they might collect pollen.  Rinse hair and eyes before bedtime. Control of House Dust Mite Allergen Dust mite allergens are a common trigger of allergy and asthma symptoms. While they can be found throughout the house, these microscopic creatures thrive in warm, humid environments such as bedding, upholstered furniture and carpeting. Because so much time is spent in the bedroom, it is essential to reduce mite levels there.  Encase pillows, mattresses, and box springs in special allergen-proof fabric covers or airtight, zippered plastic covers.  Bedding should be washed weekly in hot water (130 F) and dried in a hot dryer. Allergen-proof covers are available for comforters and pillows that can't be regularly washed.  Wash the allergy-proof covers every few months. Minimize clutter in the bedroom. Keep pets out of the bedroom.  Keep humidity less than 50% by using a dehumidifier or air conditioning. You can buy a humidity measuring device called a hygrometer to monitor this.  If possible, replace carpets with hardwood, linoleum, or washable area rugs. If that's not possible, vacuum frequently with a vacuum that has a HEPA filter. Remove all upholstered furniture and non-washable window drapes from the bedroom. Remove all non-washable stuffed toys from the bedroom.  Wash stuffed toys weekly. Mold Control Mold and fungi  can grow on a variety of surfaces provided certain temperature and moisture conditions exist.  Outdoor molds grow on plants, decaying vegetation and soil. The major outdoor mold,  Alternaria and Cladosporium, are found in very high numbers during hot and dry conditions. Generally, a late summer - fall peak is seen for common outdoor fungal spores. Rain will temporarily lower outdoor mold spore count, but counts rise rapidly when the rainy period ends. The most important indoor molds are Aspergillus and Penicillium. Dark, humid and poorly ventilated basements are ideal sites for mold growth. The next most common sites of mold growth are the bathroom and the kitchen. Outdoor (Seasonal) Mold Control Use air conditioning and keep windows closed. Avoid exposure to decaying vegetation. Avoid leaf raking. Avoid grain handling. Consider wearing a face mask if working in moldy areas.  Indoor (Perennial) Mold Control  Maintain humidity below 50%. Get rid of mold growth on hard surfaces with water, detergent and, if necessary, 5% bleach (do not mix with other cleaners). Then dry the area completely. If mold covers an area more than 10 square feet, consider hiring an indoor environmental professional. For clothing, washing with soap and water is best. If moldy items cannot be cleaned and dried, throw them away. Remove sources e.g. contaminated carpets. Repair and seal leaking roofs or pipes. Using dehumidifiers in damp basements may be helpful, but empty the water and clean units regularly to prevent mildew from forming. All rooms, especially basements, bathrooms and kitchens, require ventilation and cleaning to deter mold and mildew growth. Avoid carpeting on concrete or damp floors, and storing items in damp areas. Cockroach Allergen Avoidance Cockroaches are often found in the homes of densely populated urban areas, schools or commercial buildings, but these creatures can lurk almost anywhere. This does not mean that you have a dirty house or living area. Block all areas where roaches can enter the home. This includes crevices, wall cracks and windows.  Cockroaches need water to  survive, so fix and seal all leaky faucets and pipes. Have an exterminator go through the house when your family and pets are gone to eliminate any remaining roaches. Keep food in lidded containers and put pet food dishes away after your pets are done eating. Vacuum and sweep the floor after meals, and take out garbage and recyclables. Use lidded garbage containers in the kitchen. Wash dishes immediately after use and clean under stoves, refrigerators or toasters where crumbs can accumulate. Wipe off the stove and other kitchen surfaces and cupboards regularly.   Skin care recommendations  Bath time: Always use lukewarm water. AVOID very hot or cold water. Keep bathing time to 5-10 minutes. Do NOT use bubble bath. Use a mild soap and use just enough to wash the dirty areas. Do NOT scrub skin vigorously.  After bathing, pat dry your skin with a towel. Do NOT rub or scrub the skin.  Moisturizers and prescriptions:  ALWAYS apply moisturizers immediately after bathing (within 3 minutes). This helps to lock-in moisture. Use the moisturizer several times a day over the whole body. Good summer moisturizers include: Aveeno, CeraVe, Cetaphil. Good winter moisturizers include: Aquaphor, Vaseline, Cerave, Cetaphil, Eucerin, Vanicream. When using moisturizers along with medications, the moisturizer should be applied about one hour after applying the medication to prevent diluting effect of the medication or moisturize around where you applied the medications. When not using medications, the moisturizer can be continued twice daily as maintenance.  Laundry and clothing: Avoid laundry products with added color or perfumes.  Use unscented hypo-allergenic laundry products such as Tide free, Cheer free & gentle, and All free and clear.  If the skin still seems dry or sensitive, you can try double-rinsing the clothes. Avoid tight or scratchy clothing such as wool. Do not use fabric softeners or dyer  sheets.  Heartburn Heartburn is a type of pain or discomfort that can happen in your throat or chest. It is often described as a burning pain. It may also cause a bad, acid-like taste in your mouth. It may be caused by stomach contents that move back up (reflux) into the part of the body that moves food from your mouth to your stomach (esophagus). Heartburn may feel worse: When you lie down. When you bend over. At night. Follow these instructions at home: Eating and drinking Avoid certain foods and drinks as told by your doctor. This may include: Coffee and tea, with or without caffeine. Drinks that have alcohol. Energy drinks and sports drinks. Carbonated drinks or sodas. Chocolate and cocoa. Peppermint and mint flavorings. Garlic and onions. Horseradish. Spicy and acidic foods, such as: Peppers. Chili powder and curry powder. Vinegar. Hot sauces and BBQ sauce. Citrus fruit juices and citrus fruits, such as: Oranges. Lemons. Limes. Tomato-based foods, such as: Red sauce and pizza with red sauce. Chili. Salsa. Fried and fatty foods, such as: Donuts. [place] fries and potato chips. High-fat dressings. High-fat meats, such as: Hot dogs and sausage. Rib eye steak. Ham and bacon. High-fat dairy items, such as: Whole milk. Butter. Cream cheese. Eat small meals often. Avoid eating large meals. Avoid drinking large amounts of liquid with your meals. Avoid eating meals during the 2-3 hours before bedtime. Avoid lying down right after you eat. Do not exercise right after you eat.   Lifestyle If you are overweight, lose an amount of weight that is healthy for you. Ask your doctor about a safe weight loss goal. Do not smoke or use any products that contain nicotine or tobacco. These can make your symptoms worse. If you need help quitting, ask your doctor. Wear loose clothes. Do not wear anything tight around your waist. Raise (elevate) the head of your bed about 6 inches (15  cm) when you sleep. You can use a wedge to do this. Try to lower your stress. If you need help doing this, ask your doctor.      Medicines Take over-the-counter and prescription medicines only as told by your doctor. Do not take aspirin or NSAIDs, such as ibuprofen, unless your doctor says it is okay. Stop medicines only as told by your doctor. If you stop taking some medicines too quickly, your symptoms may get worse. General instructions Watch for any changes in your symptoms. Keep all follow-up visits. Contact a doctor if: You have new symptoms. You lose weight and you do not know why. You have trouble swallowing, or it hurts to swallow. You have wheezing or a cough that keeps happening. Your symptoms do not get better with treatment. You have heartburn often for more than 2 weeks. Get help right away if: You have pain in your arms, neck, jaw, teeth, or back all of a sudden. You feel sweaty, dizzy, or light-headed all of a sudden. You have chest pain or shortness of breath. You vomit and your vomit looks like blood or coffee grounds. Your poop (stool) is bloody or black. These symptoms may be an emergency. Get help right away. Call your local emergency services (911 in the [place] Do not wait to  see if the symptoms will go away. Do not drive yourself to the hospital. Summary Heartburn is a type of pain that can happen in your throat or chest. It can feel like a burning pain. It may also cause a bad, acid-like taste in your mouth. You may need to avoid certain foods and drinks to help your symptoms. Ask your doctor what foods and drinks you should avoid. Take over-the-counter and prescription medicines only as told by your doctor. Do not take aspirin or NSAIDs, such as ibuprofen, unless your doctor told you to do so. Contact your doctor if your symptoms do not get better or they get worse. This information is not intended to replace advice given to you by your health care provider.  Make sure you discuss any questions you have with your health care provider. Document Revised: 06/01/2020 Document Reviewed: 06/01/2020 Elsevier Patient Education  2021 [employer].

## 2021-05-30 NOTE — Assessment & Plan Note (Signed)
Stable with zyrtec 10mg  daily.  Continue medication as above.

## 2021-05-30 NOTE — Assessment & Plan Note (Signed)
Past history - Diagnosed with asthma in 2018 by pulmonology via  Methacholine challenge test - data not available for review. Tried Arnuity, Breo with no benefit. No triggers noted. Normal EGD in past per patient report. 2021 spirometry was normal with marginal improvement in FEV1 post bronchodilator treatment. Clinically feeling about the same. Interim history - well controlled with below regimen. Patient is [redacted] weeks pregnant.   Today's spirometry was normal.  ACT score 25.  . Daily controller medication(s): stop Singulair and stop Breo. . Start Symbicort 2 puffs twice a day with spacer and rinse mouth afterwards - this is the preferred ICS/LABA during pregnancy.  o If you notice worsening asthma/allergy symptoms then let us know.  . May use albuterol rescue inhaler 2 puffs every 4 to 6 hours as needed for shortness of breath, chest tightness, coughing, and wheezing. May use albuterol rescue inhaler 2 puffs 5 to 15 minutes prior to strenuous physical activities. Monitor frequency of use.

## 2021-05-30 NOTE — Assessment & Plan Note (Signed)
Past history - 2018 skin testing positive to soy, peanut and borderline to wheat. She had no prior clinical reactions to these foods. 2021 bloodwork borderline positive to hazelnut, peanut, macademia nut, pistachios and soy. Positive to sesame seed. Tolerates soy and wheat. Interim history - no reactions.   Continue to avoid sesame, peanuts and tree nuts.  For mild symptoms you can take over the counter antihistamines such as Benadryl and monitor symptoms closely. If symptoms worsen or if you have severe symptoms including breathing issues, throat closure, significant swelling, whole body hives, severe diarrhea and vomiting, lightheadedness then inject epinephrine and seek immediate medical care afterwards.  Food action plan in place.   Consider food challenge to peanuts once done with pregnancy.

## 2021-05-30 NOTE — Assessment & Plan Note (Signed)
.   Okay to take famotidine 20mg  once a day during pregnancy as needed.

## 2021-06-02 ENCOUNTER — Encounter: Payer: Self-pay | Admitting: Allergy

## 2021-06-05 ENCOUNTER — Ambulatory Visit: Payer: Managed Care, Other (non HMO) | Admitting: Internal Medicine

## 2021-06-05 ENCOUNTER — Telehealth: Payer: Self-pay | Admitting: Pharmacy Technician

## 2021-06-05 NOTE — Telephone Encounter (Signed)
Patient Advocate Encounter   Received notification from [employer] that prior authorization for GUARDIAN 3 sensor/transmitter is not required.  [phone]    [facility] will continue to follow.   Netty Starring. Dimas Aguas, CPhT Patient Advocate [facility] Phone: [phone] Fax:  [phone]

## 2021-06-12 ENCOUNTER — Other Ambulatory Visit: Payer: Self-pay | Admitting: Allergy

## 2021-06-27 ENCOUNTER — Other Ambulatory Visit: Payer: Self-pay | Admitting: Allergy

## 2021-06-27 MED ORDER — FLUTICASONE FUROATE-VILANTEROL 100-25 MCG/INH IN AEPB: 1.0000 | INHALATION_SPRAY | Freq: Every day | RESPIRATORY_TRACT | 5 refills | Status: DC

## 2021-06-27 MED ORDER — MONTELUKAST SODIUM 10 MG PO TABS: 10.0000 mg | ORAL_TABLET | Freq: Every day | ORAL | 5 refills | Status: DC

## 2021-06-27 NOTE — Telephone Encounter (Signed)
Patient was told last visit to STOP Singulair and stop Breo.

## 2021-06-27 NOTE — Telephone Encounter (Signed)
Okay to restart breo and singulair. Sent in rx.

## 2021-06-27 NOTE — Telephone Encounter (Signed)
Patient would like to go back to her [facility] Bouquet and will need a prescription for that and Montelukast. [facility] [place] on [address].

## 2021-07-14 ENCOUNTER — Ambulatory Visit: Payer: Managed Care, Other (non HMO) | Admitting: Cardiology

## 2021-07-18 ENCOUNTER — Telehealth: Payer: Self-pay

## 2021-07-18 NOTE — Telephone Encounter (Signed)
Received a prior authorization from [facility] and I submitted a PA through [employer] and it came back stating No Prior Auth needed medication is covered under current plan. Faxed the form back to [facility] at [phone] with NO PRIOR AUTHORIZATION NEEDED on the bottom.

## 2021-07-25 ENCOUNTER — Ambulatory Visit: Payer: Managed Care, Other (non HMO) | Admitting: Allergy

## 2021-08-18 ENCOUNTER — Other Ambulatory Visit: Payer: Self-pay

## 2021-08-18 ENCOUNTER — Ambulatory Visit (INDEPENDENT_AMBULATORY_CARE_PROVIDER_SITE_OTHER): Payer: BC Managed Care – PPO | Admitting: Cardiology

## 2021-08-18 ENCOUNTER — Encounter: Payer: Self-pay | Admitting: Cardiology

## 2021-08-18 VITALS — BP 109/66 | HR 75 | Ht 62.0 in | Wt 119.6 lb

## 2021-08-18 DIAGNOSIS — E782 Mixed hyperlipidemia: Secondary | ICD-10-CM

## 2021-08-18 DIAGNOSIS — E1065 Type 1 diabetes mellitus with hyperglycemia: Secondary | ICD-10-CM | POA: Diagnosis not present

## 2021-08-18 DIAGNOSIS — O0991 Supervision of high risk pregnancy, unspecified, first trimester: Secondary | ICD-10-CM | POA: Diagnosis not present

## 2021-08-18 NOTE — Progress Notes (Signed)
Cardio-Obstetrics Clinic  Follow Up Note   Date:  08/18/2021   ID:  Nicole Carrillo, DOB 1986-05-16, MRN [id number]  PCP:  Natalia Leatherwood, DO   [facility] Providers Cardiologist:  Thomasene Ripple, DO  Electrophysiologist:  None        Referring MD: Natalia Leatherwood, DO   Chief Complaint: " I am doing fine"   History of Present Illness:    Nicole Carrillo is a 35 y.o. female [G2P1001] who returns for follow up of  asthma, preeclampsia, postpartum hypertension, hyperlipidemia, Hashimoto's thyroiditis and type 1 diabetes diagnosed at 40 years,Asthma is here today for a follow up.   I saw the patient on 09/12/2020 at that she reported chest pain, I send the patient for Stress echo and placed a 14 day monitor due to palpitation. I also recommended that patient to behavior health for postpartum depression.   I saw the patient on December 30, 2020 at that time we talked about her testing results.  She was closer to her baseline.  She was back in school.  And contemplating having another child.  I saw the patient on Apr 24, 2021 at that time we talked about her recent pregnancy.  She was 7 weeks which started her on her aspirin 81 mg daily.  Since I saw her she has not had any problems.  She is doing well from a cardiovascular standpoint.   Prior CV Studies Reviewed: The following studies were reviewed today:  Stress echo IMPRESSIONS    1. This is a negative stress echocardiogram for ischemia.   2. Excellent exercise capacity (11:47 min:s; 13.4 METS). Normal BP/HR  response to exercise.   3. This is a low risk study.   FINDINGS   Exam Protocol: The patient exercised on a treadmill according to a Bruce  protocol.     Patient Performance: The patient exercised for 11 minutes and 47 seconds,  achieving 13.4 METS. The maximum stage achieved was V of the Bruce  protocol. The baseline heart rate was 103 bpm. The heart rate at peak  stress was 187 bpm. The target heart rate  was  calculated to be 158 bpm. The percentage of maximum predicted heart  rate achieved was 100.8 %. The baseline blood pressure was 152/90 mmHg.  The blood pressure at peak stress was 211/74 mmHg. The blood pressure  response was normal. The patient  developed fatigue during the stress exam. The patient's functional  capacity was excellent.     EKG: Resting EKG showed normal sinus rhythm with no abnormal findings. The  patient developed no abnormal EKG findings during exercise.      Zio monitor The patient wore the monitor for 6 days 22 hours starting September 12, 2020. Indication: Palpitations.   The minimum heart rate was 53 bpm, maximum heart rate was 162  bpm, and average heart rate was 85 bpm. Predominant underlying rhythm was Sinus Rhythm.     Premature atrial complexes were rare less than 1%. Premature Ventricular complexes rare less than 1%.   No ventricular tachycardia, no pauses, No AV block, no supraventricular tachycardia and no atrial fibrillation present. 4 patient triggered events and 2 diary events noted: 1 associated with premature atrial complex, the remaining associated with sinus rhythm and sinus tachycardia.   Conclusion: Unremarkable study with no evidence of significant arrhythmia.  Past Medical History:  Diagnosis Date   Adverse food reaction 04/28/2020   Allergy    Angio-edema    Anxiety  Asthma    Chicken pox    Constipation    Diabetes mellitus type 1 (HCC)    Dysphagia    Dysthymia    Eczema    Elevated platelet count    Fetal CPAM    Folliculitis    GERD (gastroesophageal reflux disease)    Hashimoto's thyroiditis    History of migraine    Hyperlipidemia    Insulin pump in place    Left breast mass 01/14/2017   fibroadenoma- benign   OCD (obsessive compulsive disorder)    Orthodontics    permanent upper and lower retainers   Preeclampsia 02/24/2020   PVD (posterior vitreous detachment), right    Rectal bleeding    Rectal fissure     Tinnitus    Urticaria    Wears contact lenses     Past Surgical History:  Procedure Laterality Date   DENTAL SURGERY     ENDOSCOPIC CONCHA BULLOSA RESECTION Bilateral 07/24/2018   Procedure: ENDOSCOPIC CONCHA BULLOSA MIDDLE TURBINATE;  Surgeon: Vernie MurdersJuengel, Paul, MD;  [facility];  Service: ENT;  Laterality: Bilateral;  Diabetic - insulin pump   SEPTOPLASTY N/A 07/24/2018   Procedure: SEPTOPLASTY;  Surgeon: Vernie MurdersJuengel, Paul, MD;  [facility];  Service: ENT;  Laterality: N/A;  NEEDS TO BE FIRST INSULIN PUMP   TURBINATE REDUCTION Bilateral 07/24/2018   Procedure: INFERIOR TURBINATE REDUCTION;  Surgeon: Vernie MurdersJuengel, Paul, MD;  Location: [facility];  Service: ENT;  Laterality: Bilateral;   WISDOM TOOTH EXTRACTION        OB History     Gravida  2   Para  1   Term  1   Preterm      AB      Living  1      SAB      IAB      Ectopic      Multiple  0   Live Births  1               Current Medications: Current Meds  Medication Sig   albuterol (VENTOLIN HFA) 108 (90 Base) MCG/ACT inhaler Inhale 2 puffs into the lungs every 4 (four) hours as needed for wheezing or shortness of breath.   aspirin 81 MG chewable tablet Chew 1 tablet by mouth daily.   cetirizine (ZYRTEC) 10 MG tablet Take 10 mg by mouth daily as needed.   Continuous Blood Gluc Sensor (DEXCOM G6 SENSOR) MISC 1 Device by Does not apply route as directed.   Continuous Blood Gluc Transmit (DEXCOM G6 TRANSMITTER) MISC 1 Device by Does not apply route as directed.   EPINEPHrine (AUVI-Q) 0.3 mg/0.3 mL IJ SOAJ injection Inject 0.3 mg into the muscle as needed for anaphylaxis.   fluticasone furoate-vilanterol (BREO ELLIPTA) 100-25 MCG/INH AEPB Inhale 1 puff into the lungs daily. Rinse mouth after each use.   Glucagon 1 MG/0.2ML SOAJ Inject 1 mg into the skin as directed.   HUMALOG 100 UNIT/ML injection USE 40 MAX UNITS DAILY IN INSULIN PUMP   Microlet Lancets MISC    montelukast  (SINGULAIR) 10 MG tablet Take 1 tablet (10 mg total) by mouth daily.   NON FORMULARY Continuous glucose monitor   Prenatal Vit-Fe Fumarate-FA (PRENATAL MULTIVITAMIN) TABS tablet Take 1 tablet by mouth daily at 12 noon.     Allergies:   Molds & smuts, Shellfish allergy, Acetaminophen, Cephalexin, Pneumococcal vaccines, Prednisone, Soy allergy, Adhesive [tape], Dust mite extract, and Peanut-containing drug products   Social History   Socioeconomic  History   Marital status: Married    Spouse name: Not on file   Number of children: Not on file   Years of education: Not on file   Highest education level: Not on file  Occupational History   Not on file  Tobacco Use   Smoking status: Never   Smokeless tobacco: Never  Vaping Use   Vaping Use: Never used  Substance and Sexual Activity   Alcohol use: Not Currently    Alcohol/week: 1.0 standard drink    Types: 1 Glasses of wine per week   Drug use: Not Currently   Sexual activity: Yes    Partners: Male    Birth control/protection: None  Other Topics Concern   Not on file  Social History Narrative   Marital status/children/pets: Married. Soon to have 1 son.    Education/employment: MSN, Nurse.    Safety:      -smoke alarm in the home:Yes     - wears seatbelt: Yes     - Feels safe in their relationships: Yes   Social Determinants of Health   Financial Resource Strain: Not on file  Food Insecurity: Unknown   Worried About Programme researcher, broadcasting/film/video in the Last Year: Never true   Ran Out of Food in the Last Year: Not on file  Transportation Needs: Not on file  Physical Activity: Not on file  Stress: Not on file  Social Connections: Not on file      Family History  Problem Relation Age of Onset   Arthritis Mother    Stroke Mother    Miscarriages / [place] Mother    Arthritis Father    Dementia Maternal Grandmother    Emphysema Maternal Grandfather       ROS:   Please see the history of present illness.     All other  systems reviewed and are negative.   Labs/EKG Reviewed:    EKG:   EKG is was not ordered today.      Recent Labs: 03/07/2021: Hemoglobin 13.5; Platelets 441.0 04/05/2021: ALT 15; BUN 14; Creatinine, Ser 0.69; Potassium 4.2; Sodium 135; TSH 1.18   Recent Lipid Panel Lab Results  Component Value Date/Time   CHOL 160 05/16/2021 01:32 PM   TRIG 109.0 05/16/2021 01:32 PM   HDL 108.20 05/16/2021 01:32 PM   CHOLHDL 1 05/16/2021 01:32 PM   LDLCALC 30 05/16/2021 01:32 PM    Physical Exam:    VS:  BP 109/66 (BP Location: Left Arm, Patient Position: Sitting, Cuff Size: Normal)   Pulse 75   Ht 5\' 2"  (1.575 m)   Wt 119 lb 9.6 oz (54.3 kg)   LMP 03/01/2021 (Exact Date)   SpO2 98%   BMI 21.88 kg/m     Wt Readings from Last 3 Encounters:  08/18/21 119 lb 9.6 oz (54.3 kg)  05/16/21 109 lb (49.4 kg)  04/24/21 108 lb 1.9 oz (49 kg)     GEN:  Well nourished, well developed in no acute distress HEENT: Normal NECK: No JVD; No carotid bruits LYMPHATICS: No lymphadenopathy CARDIAC: RRR, no murmurs, rubs, gallops RESPIRATORY:  Clear to auscultation without rales, wheezing or rhonchi  ABDOMEN: Soft, non-tender, non-distended MUSCULOSKELETAL:  No edema; No deformity  SKIN: Warm and dry NEUROLOGIC:  Alert and oriented x 3 PSYCHIATRIC:  Normal affect    Risk Assessment/Risk Calculators:     CARPREG II Risk Prediction Index Score:  1.  The patient's risk for a primary cardiac event is 5%.  ASSESSMENT & PLAN:    History of Postpartum hypertension  History of Preclampsia Type 1 diabetes uses insulin pump High risk pregnancy Hyperlipidemia  She is doing well  from a CV standpoint. Her blood pressure is below target. She will continue to take her blood pressure daily.  diet medication with her hyperlipidemia. Diabetes management per pcp. Plan for vaginal delivery unless C-section is indicated by the ob team.   Patient Instructions  Medication Instructions:  Your  physician recommends that you continue on your current medications as directed. Please refer to the Current Medication list given to you today.  *If you need a refill on your cardiac medications before your next appointment, please call your pharmacy*   Lab Work: None If you have labs (blood work) drawn today and your tests are completely normal, you will receive your results only by: MyChart Message (if you have MyChart) OR A paper copy in the mail If you have any lab test that is abnormal or we need to change your treatment, we will call you to review the results.   Testing/Procedures: None   Follow-Up: At [facility], you and your health needs are our priority.  As part of our continuing mission to provide you with exceptional heart care, we have created designated Provider Care Teams.  These Care Teams include your primary Cardiologist (physician) and Advanced Practice Providers (APPs -  Physician Assistants and Nurse Practitioners) who all work together to provide you with the care you need, when you need it.  We recommend signing up for the patient portal called "MyChart".  Sign up information is provided on this After Visit Summary.  MyChart is used to connect with patients for Virtual Visits (Telemedicine).  Patients are able to view lab/test results, encounter notes, upcoming appointments, etc.  Non-urgent messages can be sent to your provider as well.   To learn more about what you can do with MyChart, go to [email or web address].    Your next appointment:   12 week(s)  The format for your next appointment:   Virtual Visit   Provider:   Thomasene Ripple, DO   Other Instructions     Dispo:  No follow-ups on file.   Medication Adjustments/Labs and Tests Ordered: Current medicines are reviewed at length with the patient today.  Concerns regarding medicines are outlined above.  Tests Ordered: No orders of the defined types were placed in this encounter.  Medication  Changes: No orders of the defined types were placed in this encounter.

## 2021-08-18 NOTE — Patient Instructions (Signed)
Medication Instructions:  °Your physician recommends that you continue on your current medications as directed. Please refer to the Current Medication list given to you today.  °*If you need a refill on your cardiac medications before your next appointment, please call your pharmacy* ° ° °Lab Work: °None °If you have labs (blood work) drawn today and your tests are completely normal, you will receive your results only by: °MyChart Message (if you have MyChart) OR °A paper copy in the mail °If you have any lab test that is abnormal or we need to change your treatment, we will call you to review the results. ° ° °Testing/Procedures: °None ° ° °Follow-Up: °At [facility], you and your health needs are our priority.  As part of our continuing mission to provide you with exceptional heart care, we have created designated Provider Care Teams.  These Care Teams include your primary Cardiologist (physician) and Advanced Practice Providers (APPs -  Physician Assistants and Nurse Practitioners) who all work together to provide you with the care you need, when you need it. ° °We recommend signing up for the patient portal called "MyChart".  Sign up information is provided on this After Visit Summary.  MyChart is used to connect with patients for Virtual Visits (Telemedicine).  Patients are able to view lab/test results, encounter notes, upcoming appointments, etc.  Non-urgent messages can be sent to your provider as well.   °To learn more about what you can do with MyChart, go to [email or web address].   ° °Your next appointment:   °12 week(s) ° °The format for your next appointment:   °Virtual Visit  ° °Provider:   °Kardie Tobb, DO   ° ° °Other Instructions °  °

## 2021-08-24 ENCOUNTER — Other Ambulatory Visit: Payer: Self-pay

## 2021-08-24 ENCOUNTER — Ambulatory Visit (INDEPENDENT_AMBULATORY_CARE_PROVIDER_SITE_OTHER): Payer: BC Managed Care – PPO

## 2021-08-24 DIAGNOSIS — Z23 Encounter for immunization: Secondary | ICD-10-CM | POA: Diagnosis not present

## 2021-09-17 ENCOUNTER — Other Ambulatory Visit: Payer: Self-pay | Admitting: Internal Medicine

## 2021-09-18 NOTE — Telephone Encounter (Signed)
Is it okay to change Humalog to [facility]

## 2021-09-19 NOTE — Progress Notes (Signed)
RE: Nicole Carrillo MRN: [id number] DOB: 20-Feb-1986 Date of Telemedicine Visit: 09/20/2021  Referring provider: Natalia Leatherwood, DO Primary care provider: Natalia Leatherwood, DO  Chief Complaint: Asthma (Pregnant [redacted] weeks and breathing has been little more labored and allergies are really affecting her)   Telemedicine Follow Up Visit via Telephone: I connected with Nicole Carrillo for a follow up on 09/21/21 by telephone and verified that I am speaking with the correct person using two identifiers.   I discussed the limitations, risks, security and privacy concerns of performing an evaluation and management service by telephone and the availability of in person appointments. I also discussed with the patient that there may be a patient responsible charge related to this service. The patient expressed understanding and agreed to proceed.  Patient is at home/work. Provider is at the office.  Visit start time: 8:25AM Visit end time: 8:46AM Insurance consent/check in by: front desk Medical consent and medical assistant/nurse: Nicole Carrillo.  History of Present Illness: She is a 35 y.o. female, who is being followed for asthma, allergic rhinoconjunctivitis, food allergy, dermatographism and heartburn. Her previous allergy office visit was on 05/30/2021 with Nicole Carrillo. Today is a regular follow up visit.  Patient is currently [redacted] weeks pregnant and she is due December 28th.   Asthma Patient noted some shortness of breath with her allergies flaring for the past 1 month. Currently on Breo 1 puff once a day and Singulair daily.  No albuterol use since the last visit. She is hesitant in using more medications than necessary.  Denies any ER/urgent care visits or prednisone use since the last visit.  Rash  Currently taking zyrtec 10mg  1-2 times a day due to breaking out.  Used topical OTC hydrocortisone 1% with good benefit.   Seasonal and perennial allergic rhinoconjunctivitis Taking zyrtec  10mg  daily and Singulair daily which controls symptoms but having some itchy/rash.    Adverse food reaction Currently avoiding sesame, peanuts and tree nuts. No reactions since the last visit.  Assessment and Plan: [name] is a 35 y.o. female with: Asthma Past history - Diagnosed with asthma in 2018 by pulmonology via  Methacholine challenge test - data not available for review. Tried Arnuity, Breo with no benefit. No triggers noted. Normal EGD in past per patient report. 2021 spirometry was normal with marginal improvement in FEV1 post bronchodilator treatment. Clinically feeling about the same. Interim history - noticed shortness of breath x 1 month with allergies flaring. She did not use albuterol during these episodes. [redacted] weeks pregnant. Daily controller medication(s): continue Breo 2019 1 puff once a day and rinse mouth after each use. Continue Singulair (montelukast) 10mg  daily at night. May increase to Breo 2022 1 puff once a day if you are having symptoms more than twice per week! Encouraged patient to use albuterol if needed for symptoms.  May use albuterol rescue inhaler 2 puffs every 4 to 6 hours as needed for shortness of breath, chest tightness, coughing, and wheezing. May use albuterol rescue inhaler 2 puffs 5 to 15 minutes prior to strenuous physical activities. Monitor frequency of use.   Seasonal and perennial allergic rhinoconjunctivitis Past history - Perennial rhino conjunctivitis symptoms for the past 3 years. 2018 skin testing by ENT was positive to grass, ragweed, weed, trees, molds, dust mites, cockroach, horse. SLIT therapy by ENT x few months with no benefit. Deviated septum repair and turbinate reduction in 2019. 2021 bloodwork positive to dust mites, grass pollen, mold, tree pollen, ragweed pollen  and weed pollen. Dymista caused nausea.  Interim history - symptoms flared x 1 month.  Continue environmental control measures. Continue Singulair (montelukast) 10mg   daily at night. Continue with Zyrtec (cetirizine) 10mg  1-2 times a day as needed.  Consider allergy injections for long term control if above medications do not help the symptoms once no longer pregnant.  Adverse food reaction Past history - 2018 skin testing positive to soy, peanut and borderline to wheat. She had no prior clinical reactions to these foods. 2021 bloodwork borderline positive to hazelnut, peanut, macademia nut, pistachios and soy. Positive to sesame seed. Tolerates soy and wheat. Interim history - no reactions.  Continue to avoid sesame, peanuts and tree nuts. For mild symptoms you can take over the counter antihistamines such as Benadryl and monitor symptoms closely. If symptoms worsen or if you have severe symptoms including breathing issues, throat closure, significant swelling, whole body hives, severe diarrhea and vomiting, lightheadedness then inject epinephrine and seek immediate medical care afterwards. Food action plan in place.  Consider food challenge to peanuts once done with pregnancy.  Dermatographism Breaking out more and taking zyrtec 10mg  BID with good benefit.  Continue proper skin care. Send me pictures of the rash. Continue with Zyrtec (cetirizine) 10mg  1-2 times a day as needed.   Heartburn Okay to take famotidine 20mg  once a day during pregnancy as needed.  Return in about 4 weeks (around 10/18/2021).  Meds ordered this encounter  Medications   montelukast (SINGULAIR) 10 MG tablet    Sig: Take 1 tablet (10 mg total) by mouth daily.    Dispense:  90 tablet    Refill:  1   fluticasone furoate-vilanterol (BREO ELLIPTA) 200-25 MCG/INH AEPB    Sig: Inhale 1 puff into the lungs daily. Rinse mouth after each use.    Dispense:  60 each    Refill:  3    Place Rx on hold until patient ready for pick up.    Lab Orders  No laboratory test(s) ordered today    Diagnostics: None.  Medication List:  Current Outpatient Medications  Medication Sig  Dispense Refill   albuterol (VENTOLIN HFA) 108 (90 Base) MCG/ACT inhaler Inhale 2 puffs into the lungs every 4 (four) hours as needed for wheezing or shortness of breath. 18 g 1   aspirin 81 MG chewable tablet Chew 1 tablet by mouth daily.     cetirizine (ZYRTEC) 10 MG tablet Take 10 mg by mouth daily as needed.     Continuous Blood Gluc Sensor (DEXCOM G6 SENSOR) MISC 1 Device by Does not apply route as directed. 9 each 3   Continuous Blood Gluc Transmit (DEXCOM G6 TRANSMITTER) MISC 1 Device by Does not apply route as directed. 1 each 3   EPINEPHrine (AUVI-Q) 0.3 mg/0.3 mL IJ SOAJ injection Inject 0.3 mg into the muscle as needed for anaphylaxis. 1 each 2   fluticasone furoate-vilanterol (BREO ELLIPTA) 100-25 MCG/INH AEPB Inhale 1 puff into the lungs daily. Rinse mouth after each use. 60 each 5   fluticasone furoate-vilanterol (BREO ELLIPTA) 200-25 MCG/INH AEPB Inhale 1 puff into the lungs daily. Rinse mouth after each use. 60 each 3   Glucagon 1 MG/0.2ML SOAJ Inject 1 mg into the skin as directed. 0.2 mL 1   HUMALOG 100 UNIT/ML injection USE 40 MAX UNITS DAILY IN INSULIN PUMP 20 mL 4   Microlet Lancets MISC      NON FORMULARY Continuous glucose monitor     Prenatal Vit-Fe Fumarate-FA (PRENATAL MULTIVITAMIN) TABS  tablet Take 1 tablet by mouth daily at 12 noon.     montelukast (SINGULAIR) 10 MG tablet Take 1 tablet (10 mg total) by mouth daily. 90 tablet 1   No current facility-administered medications for this visit.   Allergies: Allergies  Allergen Reactions   Molds & Smuts Itching and Shortness Of Breath   Shellfish Allergy Shortness Of Breath and Rash    Crab causes SOB, Scallops cause rash.  Can eat Shrimp and lobster.   Acetaminophen Other (See Comments)    Effects blood sugars and insulin pump.   Cephalexin Nausea Only and Other (See Comments)    Headaches, dizziness   Pneumococcal Vaccines Swelling    Local injection swelling   Prednisone Nausea Only and Other (See Comments)     Headaches, dizziness, Prefers not to take because of uncontrollable BG's on this med;      Soy Allergy Other (See Comments)    Blood test   Adhesive [Tape] Rash    With extended exposure   Dust Mite Extract Rash   Peanut-Containing Drug Products Rash   I reviewed her past medical history, social history, family history, and environmental history and no significant changes have been reported from her previous visit.  Review of Systems  Constitutional:  Negative for appetite change, chills, fever and unexpected weight change.  HENT:  Negative for congestion and rhinorrhea.   Eyes:  Negative for itching.  Respiratory:  Positive for shortness of breath. Negative for cough, chest tightness and wheezing.   Cardiovascular:  Negative for chest pain.  Gastrointestinal:  Negative for abdominal pain.  Genitourinary:  Negative for difficulty urinating.  Skin:  Positive for rash.  Allergic/Immunologic: Positive for environmental allergies.  Neurological:  Negative for headaches.   Objective: Physical Exam Not obtained as encounter was done via telephone.   Previous notes and tests were reviewed.  I discussed the assessment and treatment plan with the patient. The patient was provided an opportunity to ask questions and all were answered. The patient agreed with the plan and demonstrated an understanding of the instructions. After visit summary/patient instructions available via mychart.   The patient was advised to call back or seek an in-person evaluation if the symptoms worsen or if the condition fails to improve as anticipated.  I provided 21 minutes of non-face-to-face time during this encounter.  It was my pleasure to participate in Mifflin Kussman's care today. Please feel free to contact me with any questions or concerns.   Sincerely,  Wyline Mood, DO Allergy & Immunology  [facility] office: [phone] [facility] office:  [phone]

## 2021-09-20 ENCOUNTER — Other Ambulatory Visit: Payer: Self-pay

## 2021-09-20 ENCOUNTER — Ambulatory Visit: Payer: Managed Care, Other (non HMO) | Admitting: Allergy

## 2021-09-20 ENCOUNTER — Encounter: Payer: Self-pay | Admitting: Allergy

## 2021-09-20 ENCOUNTER — Ambulatory Visit (INDEPENDENT_AMBULATORY_CARE_PROVIDER_SITE_OTHER): Payer: BC Managed Care – PPO | Admitting: Allergy

## 2021-09-20 DIAGNOSIS — T781XXD Other adverse food reactions, not elsewhere classified, subsequent encounter: Secondary | ICD-10-CM

## 2021-09-20 DIAGNOSIS — J302 Other seasonal allergic rhinitis: Secondary | ICD-10-CM | POA: Diagnosis not present

## 2021-09-20 DIAGNOSIS — L503 Dermatographic urticaria: Secondary | ICD-10-CM | POA: Diagnosis not present

## 2021-09-20 DIAGNOSIS — J4540 Moderate persistent asthma, uncomplicated: Secondary | ICD-10-CM | POA: Diagnosis not present

## 2021-09-20 DIAGNOSIS — J3089 Other allergic rhinitis: Secondary | ICD-10-CM

## 2021-09-20 DIAGNOSIS — H1010 Acute atopic conjunctivitis, unspecified eye: Secondary | ICD-10-CM

## 2021-09-20 DIAGNOSIS — H1013 Acute atopic conjunctivitis, bilateral: Secondary | ICD-10-CM

## 2021-09-20 DIAGNOSIS — R12 Heartburn: Secondary | ICD-10-CM

## 2021-09-20 MED ORDER — MONTELUKAST SODIUM 10 MG PO TABS: 10.0000 mg | ORAL_TABLET | Freq: Every day | ORAL | 1 refills | Status: DC

## 2021-09-20 MED ORDER — FLUTICASONE FUROATE-VILANTEROL 200-25 MCG/INH IN AEPB: 1.0000 | INHALATION_SPRAY | Freq: Every day | RESPIRATORY_TRACT | 3 refills | Status: DC

## 2021-09-20 NOTE — Patient Instructions (Addendum)
Asthma:  Daily controller medication(s): continue Breo 1 puff once a day and rinse mouth after each use. Continue Singulair (montelukast) 10mg  daily at night. May increase to Breo 1 puff once a day if you are having symptoms more than twice per week! May use albuterol rescue inhaler 2 puffs every 4 to 6 hours as needed for shortness of breath, chest tightness, coughing, and wheezing. May use albuterol rescue inhaler 2 puffs 5 to 15 minutes prior to strenuous physical activities. Monitor frequency of use.  Asthma control goals:  Full participation in all desired activities (may need albuterol before activity) Albuterol use two times or less a week on average (not counting use with activity) Cough interfering with sleep two times or less a month Oral steroids no more than once a year No hospitalizations  Environmental allergies: Continue Singulair (montelukast) 10mg  daily at night. Continue with Zyrtec (cetirizine) 10mg  1-2 times a day as needed.  Consider allergy injections for long term control if above medications do not help the symptoms - when no longer pregnant.  Food allergies: Continue to avoid sesame, peanuts and tree nuts. For mild symptoms you can take over the counter antihistamines such as Benadryl and monitor symptoms closely. If symptoms worsen or if you have severe symptoms including breathing issues, throat closure, significant swelling, whole body hives, severe diarrhea and vomiting, lightheadedness then inject epinephrine and seek immediate medical care afterwards. Food action plan in place.   Skin  Continue proper skin care. Send me pictures of the rash. Continue with Zyrtec (cetirizine) 10mg  1-2 times a day as needed.   Heartburn Okay to take famotidine 20mg  once a day during pregnancy as needed.  Follow up in 1 months or sooner if needed.  Reducing Pollen Exposure Pollen seasons: trees (spring), grass (summer) and ragweed/weeds (fall). Keep windows  closed in your home and car to lower pollen exposure.  Install air conditioning in the bedroom and throughout the house if possible.  Avoid going out in dry windy days - especially early morning. Pollen counts are highest between 5 - 10 AM and on dry, hot and windy days.  Save outside activities for late afternoon or after a heavy rain, when pollen levels are lower.  Avoid mowing of grass if you have grass pollen allergy. Be aware that pollen can also be transported indoors on people and pets.  Dry your clothes in an automatic dryer rather than hanging them outside where they might collect pollen.  Rinse hair and eyes before bedtime. Control of House Dust Mite Allergen Dust mite allergens are a common trigger of allergy and asthma symptoms. While they can be found throughout the house, these microscopic creatures thrive in warm, humid environments such as bedding, upholstered furniture and carpeting. Because so much time is spent in the bedroom, it is essential to reduce mite levels there.  Encase pillows, mattresses, and box springs in special allergen-proof fabric covers or airtight, zippered plastic covers.  Bedding should be washed weekly in hot water (130 F) and dried in a hot dryer. Allergen-proof covers are available for comforters and pillows that can't be regularly washed.  Wash the allergy-proof covers every few months. Minimize clutter in the bedroom. Keep pets out of the bedroom.  Keep humidity less than 50% by using a dehumidifier or air conditioning. You can buy a humidity measuring device called a hygrometer to monitor this.  If possible, replace carpets with hardwood, linoleum, or washable area rugs. If that's not possible, vacuum frequently with  a vacuum that has a HEPA filter. Remove all upholstered furniture and non-washable window drapes from the bedroom. Remove all non-washable stuffed toys from the bedroom.  Wash stuffed toys weekly. Mold Control Mold and fungi can grow on  a variety of surfaces provided certain temperature and moisture conditions exist.  Outdoor molds grow on plants, decaying vegetation and soil. The major outdoor mold, Alternaria and Cladosporium, are found in very high numbers during hot and dry conditions. Generally, a late summer - fall peak is seen for common outdoor fungal spores. Rain will temporarily lower outdoor mold spore count, but counts rise rapidly when the rainy period ends. The most important indoor molds are Aspergillus and Penicillium. Dark, humid and poorly ventilated basements are ideal sites for mold growth. The next most common sites of mold growth are the bathroom and the kitchen. Outdoor (Seasonal) Mold Control Use air conditioning and keep windows closed. Avoid exposure to decaying vegetation. Avoid leaf raking. Avoid grain handling. Consider wearing a face mask if working in moldy areas.  Indoor (Perennial) Mold Control  Maintain humidity below 50%. Get rid of mold growth on hard surfaces with water, detergent and, if necessary, 5% bleach (do not mix with other cleaners). Then dry the area completely. If mold covers an area more than 10 square feet, consider hiring an indoor environmental professional. For clothing, washing with soap and water is best. If moldy items cannot be cleaned and dried, throw them away. Remove sources e.g. contaminated carpets. Repair and seal leaking roofs or pipes. Using dehumidifiers in damp basements may be helpful, but empty the water and clean units regularly to prevent mildew from forming. All rooms, especially basements, bathrooms and kitchens, require ventilation and cleaning to deter mold and mildew growth. Avoid carpeting on concrete or damp floors, and storing items in damp areas. Cockroach Allergen Avoidance Cockroaches are often found in the homes of densely populated urban areas, schools or commercial buildings, but these creatures can lurk almost anywhere. This does not mean that you  have a dirty house or living area. Block all areas where roaches can enter the home. This includes crevices, wall cracks and windows.  Cockroaches need water to survive, so fix and seal all leaky faucets and pipes. Have an exterminator go through the house when your family and pets are gone to eliminate any remaining roaches. Keep food in lidded containers and put pet food dishes away after your pets are done eating. Vacuum and sweep the floor after meals, and take out garbage and recyclables. Use lidded garbage containers in the kitchen. Wash dishes immediately after use and clean under stoves, refrigerators or toasters where crumbs can accumulate. Wipe off the stove and other kitchen surfaces and cupboards regularly.   Skin care recommendations  Bath time: Always use lukewarm water. AVOID very hot or cold water. Keep bathing time to 5-10 minutes. Do NOT use bubble bath. Use a mild soap and use just enough to wash the dirty areas. Do NOT scrub skin vigorously.  After bathing, pat dry your skin with a towel. Do NOT rub or scrub the skin.  Moisturizers and prescriptions:  ALWAYS apply moisturizers immediately after bathing (within 3 minutes). This helps to lock-in moisture. Use the moisturizer several times a day over the whole body. Good summer moisturizers include: Aveeno, CeraVe, Cetaphil. Good winter moisturizers include: Aquaphor, Vaseline, Cerave, Cetaphil, Eucerin, Vanicream. When using moisturizers along with medications, the moisturizer should be applied about one hour after applying the medication to prevent diluting effect  of the medication or moisturize around where you applied the medications. When not using medications, the moisturizer can be continued twice daily as maintenance.  Laundry and clothing: Avoid laundry products with added color or perfumes. Use unscented hypo-allergenic laundry products such as Tide free, Cheer free & gentle, and All free and clear.  If the skin  still seems dry or sensitive, you can try double-rinsing the clothes. Avoid tight or scratchy clothing such as wool. Do not use fabric softeners or dyer sheets.

## 2021-09-21 NOTE — Assessment & Plan Note (Addendum)
Past history - Perennial rhino conjunctivitis symptoms for the past 3 years. 2018 skin testing by ENT was positive to grass, ragweed, weed, trees, molds, dust mites, cockroach, horse. SLIT therapy by ENT x few months with no benefit. Deviated septum repair and turbinate reduction in 2019. 2021 bloodwork positive to dust mites, grass pollen, mold, tree pollen, ragweed pollen and weed pollen. Dymista caused nausea.  Interim history - symptoms flared x 1 month.  . Continue environmental control measures. . Continue Singulair (montelukast) 10mg  daily at night. . Continue with Zyrtec (cetirizine) 10mg  1-2 times a day as needed.  . Consider allergy injections for long term control if above medications do not help the symptoms once no longer pregnant.

## 2021-09-21 NOTE — Assessment & Plan Note (Signed)
Past history - Diagnosed with asthma in 2018 by pulmonology via  Methacholine challenge test - data not available for review. Tried Arnuity, Breo with no benefit. No triggers noted. Normal EGD in past per patient report. 2021 spirometry was normal with marginal improvement in FEV1 post bronchodilator treatment. Clinically feeling about the same. Interim history - noticed shortness of breath x 1 month with allergies flaring. She did not use albuterol during these episodes. [redacted] weeks pregnant. . Daily controller medication(s): continue Breo 1 puff once a day and rinse mouth after each use. o Continue Singulair (montelukast) 10mg  daily at night. o May increase to Breo 1 puff once a day if you are having symptoms more than twice per week! o Encouraged patient to use albuterol if needed for symptoms.  . May use albuterol rescue inhaler 2 puffs every 4 to 6 hours as needed for shortness of breath, chest tightness, coughing, and wheezing. May use albuterol rescue inhaler 2 puffs 5 to 15 minutes prior to strenuous physical activities. Monitor frequency of use.

## 2021-09-21 NOTE — Assessment & Plan Note (Signed)
Breaking out more and taking zyrtec 10mg  BID with good benefit.   Continue proper skin care.  Send me pictures of the rash.  Continue with Zyrtec (cetirizine) 10mg  1-2 times a day as needed.

## 2021-09-21 NOTE — Assessment & Plan Note (Signed)
.   Okay to take famotidine 20mg once a day during pregnancy as needed. 

## 2021-09-21 NOTE — Assessment & Plan Note (Signed)
Past history - 2018 skin testing positive to soy, peanut and borderline to wheat. She had no prior clinical reactions to these foods. 2021 bloodwork borderline positive to hazelnut, peanut, macademia nut, pistachios and soy. Positive to sesame seed. Tolerates soy and wheat. Interim history - no reactions.   Continue to avoid sesame, peanuts and tree nuts.  For mild symptoms you can take over the counter antihistamines such as Benadryl and monitor symptoms closely. If symptoms worsen or if you have severe symptoms including breathing issues, throat closure, significant swelling, whole body hives, severe diarrhea and vomiting, lightheadedness then inject epinephrine and seek immediate medical care afterwards.  Food action plan in place.   Consider food challenge to peanuts once done with pregnancy. 

## 2021-10-27 NOTE — Patient Instructions (Addendum)
Asthma Consider changing to [facility] or Advair HFA at your next appointment due to insurance coverage. Symbicort caused back pain, abdominal cramps, and reflux Daily controller medication(s): continue Breo 1 puff once a day and rinse mouth after each use. Continue Singulair (montelukast) 10mg  daily at night. May increase to Breo 1 puff once a day if you are having symptoms more than twice per week! May use albuterol rescue inhaler 2 puffs every 4 to 6 hours as needed for shortness of breath, chest tightness, coughing, and wheezing. May use albuterol rescue inhaler 2 puffs 5 to 15 minutes prior to strenuous physical activities. Monitor frequency of use.  Asthma control goals:  Full participation in all desired activities (may need albuterol before activity) Albuterol use two times or less a week on average (not counting use with activity) Cough interfering with sleep two times or less a month Oral steroids no more than once a year No hospitalizations  Seasonal and perennial allergic rhinoconjunctivitis (Dymista caused nausea) Continue Singulair (montelukast) 10mg  daily at night. Continue with Zyrtec (cetirizine) 10mg  1-2 times a day as needed.  Consider allergy injections for long term control if above medications do not help the symptoms - when no longer pregnant.  Food allergies: Continue to avoid sesame, peanuts and tree nuts. In case of an allergic reaction, give Benadryl 4 teaspoonfuls every 4 hours, and if life-threatening symptoms occur, inject with AuviQ 0.3 mg. Consider skin testing to these allergens. You will need to be off all antihistamines 3 days prior to this appointment   Dermatographism   Continue proper skin care. Send me pictures of the rash. Continue with Zyrtec (cetirizine) 10mg  1-2 times a day as needed.   Heartburn Okay to take famotidine 20mg  once a day during pregnancy as needed.  Follow up in 2 months or sooner if needed.  Reducing Pollen  Exposure Pollen seasons: trees (spring), grass (summer) and ragweed/weeds (fall). Keep windows closed in your home and car to lower pollen exposure.  Install air conditioning in the bedroom and throughout the house if possible.  Avoid going out in dry windy days - especially early morning. Pollen counts are highest between 5 - 10 AM and on dry, hot and windy days.  Save outside activities for late afternoon or after a heavy rain, when pollen levels are lower.  Avoid mowing of grass if you have grass pollen allergy. Be aware that pollen can also be transported indoors on people and pets.  Dry your clothes in an automatic dryer rather than hanging them outside where they might collect pollen.  Rinse hair and eyes before bedtime. Control of House Dust Mite Allergen Dust mite allergens are a common trigger of allergy and asthma symptoms. While they can be found throughout the house, these microscopic creatures thrive in warm, humid environments such as bedding, upholstered furniture and carpeting. Because so much time is spent in the bedroom, it is essential to reduce mite levels there.  Encase pillows, mattresses, and box springs in special allergen-proof fabric covers or airtight, zippered plastic covers.  Bedding should be washed weekly in hot water (130 F) and dried in a hot dryer. Allergen-proof covers are available for comforters and pillows that can't be regularly washed.  Wash the allergy-proof covers every few months. Minimize clutter in the bedroom. Keep pets out of the bedroom.  Keep humidity less than 50% by using a dehumidifier or air conditioning. You can buy a humidity measuring device called a hygrometer to monitor this.  If  possible, replace carpets with hardwood, linoleum, or washable area rugs. If that's not possible, vacuum frequently with a vacuum that has a HEPA filter. Remove all upholstered furniture and non-washable window drapes from the bedroom. Remove all non-washable  stuffed toys from the bedroom.  Wash stuffed toys weekly. Mold Control Mold and fungi can grow on a variety of surfaces provided certain temperature and moisture conditions exist.  Outdoor molds grow on plants, decaying vegetation and soil. The major outdoor mold, Alternaria and Cladosporium, are found in very high numbers during hot and dry conditions. Generally, a late summer - fall peak is seen for common outdoor fungal spores. Rain will temporarily lower outdoor mold spore count, but counts rise rapidly when the rainy period ends. The most important indoor molds are Aspergillus and Penicillium. Dark, humid and poorly ventilated basements are ideal sites for mold growth. The next most common sites of mold growth are the bathroom and the kitchen. Outdoor (Seasonal) Mold Control Use air conditioning and keep windows closed. Avoid exposure to decaying vegetation. Avoid leaf raking. Avoid grain handling. Consider wearing a face mask if working in moldy areas.  Indoor (Perennial) Mold Control  Maintain humidity below 50%. Get rid of mold growth on hard surfaces with water, detergent and, if necessary, 5% bleach (do not mix with other cleaners). Then dry the area completely. If mold covers an area more than 10 square feet, consider hiring an indoor environmental professional. For clothing, washing with soap and water is best. If moldy items cannot be cleaned and dried, throw them away. Remove sources e.g. contaminated carpets. Repair and seal leaking roofs or pipes. Using dehumidifiers in damp basements may be helpful, but empty the water and clean units regularly to prevent mildew from forming. All rooms, especially basements, bathrooms and kitchens, require ventilation and cleaning to deter mold and mildew growth. Avoid carpeting on concrete or damp floors, and storing items in damp areas. Cockroach Allergen Avoidance Cockroaches are often found in the homes of densely populated urban areas, schools  or commercial buildings, but these creatures can lurk almost anywhere. This does not mean that you have a dirty house or living area. Block all areas where roaches can enter the home. This includes crevices, wall cracks and windows.  Cockroaches need water to survive, so fix and seal all leaky faucets and pipes. Have an exterminator go through the house when your family and pets are gone to eliminate any remaining roaches. Keep food in lidded containers and put pet food dishes away after your pets are done eating. Vacuum and sweep the floor after meals, and take out garbage and recyclables. Use lidded garbage containers in the kitchen. Wash dishes immediately after use and clean under stoves, refrigerators or toasters where crumbs can accumulate. Wipe off the stove and other kitchen surfaces and cupboards regularly.

## 2021-10-30 ENCOUNTER — Other Ambulatory Visit: Payer: Self-pay

## 2021-10-30 ENCOUNTER — Encounter: Payer: Self-pay | Admitting: Family

## 2021-10-30 ENCOUNTER — Ambulatory Visit: Payer: BC Managed Care – PPO | Admitting: Family

## 2021-10-30 DIAGNOSIS — R12 Heartburn: Secondary | ICD-10-CM | POA: Diagnosis not present

## 2021-10-30 DIAGNOSIS — Z3490 Encounter for supervision of normal pregnancy, unspecified, unspecified trimester: Secondary | ICD-10-CM

## 2021-10-30 DIAGNOSIS — J302 Other seasonal allergic rhinitis: Secondary | ICD-10-CM | POA: Diagnosis not present

## 2021-10-30 DIAGNOSIS — L503 Dermatographic urticaria: Secondary | ICD-10-CM

## 2021-10-30 DIAGNOSIS — H1013 Acute atopic conjunctivitis, bilateral: Secondary | ICD-10-CM

## 2021-10-30 DIAGNOSIS — H1010 Acute atopic conjunctivitis, unspecified eye: Secondary | ICD-10-CM

## 2021-10-30 DIAGNOSIS — J4540 Moderate persistent asthma, uncomplicated: Secondary | ICD-10-CM | POA: Diagnosis not present

## 2021-10-30 DIAGNOSIS — J3089 Other allergic rhinitis: Secondary | ICD-10-CM

## 2021-10-30 NOTE — Progress Notes (Signed)
RE: Solimar Maiden MRN: [id number] DOB: [birth year] Date of Telemedicine Visit: 10/30/2021  Referring provider: Natalia Leatherwood, DO Primary care provider: Natalia Leatherwood, DO  Chief Complaint: Follow-up (Asthma is doing better hasnt had to use the inhaler./Allergies are doing well too. She is taking it once a day.)   Telemedicine Follow Up Visit via Telephone: I connected with Nicole Carrillo for a follow up on 10/30/21 by telephone and verified that I am speaking with the correct person using two identifiers.   I discussed the limitations, risks, security and privacy concerns of performing an evaluation and management service by telephone and the availability of in person appointments. I also discussed with the patient that there may be a patient responsible charge related to this service. The patient expressed understanding and agreed to proceed.  Patient is at home. Provider is at the office.  Visit start time: 10:14 AM Visit end time: 10:48 AM Insurance consent/check in by: Jodelle Gross. Medical consent and medical assistant/nurse: Trayce J.  History of Present Illness: She is a 35 y.o. female, who is being followed for asthma, seasonal and perennial allergic rhinoconjunctivitis, adverse food reaction, dermatographism, and heartburn.. Her previous allergy office visit was on September 20, 2021 with Dr. Selena Batten.  She reports that this Wednesday she will be [redacted] weeks pregnant.  Asthma is reported as controlled with Breo 100 mcg 1 puff once a day, Singulair 10 mg once a day, and albuterol as needed.  She denies coughing, wheezing, tightness in chest, and nocturnal awakenings due to breathing problems.  She reports some shortness of breath but feels that this is due to pregnancy and not asthma.  Since her last office visit she has not required any systemic steroids or made any trips to the emergency room or urgent care due to breathing problems.  She has also not had to use her albuterol inhaler since  we last saw her.  Also, she has not had to increase to Breo 200 mcg 1 puff once a day.  She mentions that she is on a new insurance and has 2 months left of Breo.  Breo costs her $75.  She wonders what the plan will be after this.  She reports that when she became pregnant she was switched back to Symbicort  had a weird reaction.  She mentions that she had weird back pain and is afraid to try it again.  She reports that the pain could have been due to pregnancy.  She also had acid reflux at that time.  She does not wish to switch back to Symbicort.  She has never tried [place] or Advair.  Seasonal and perennial allergic rhinoconjunctivitis is reported as controlled with Singulair 10 mg once a day and Zyrtec 10 mg 1-2 times a day.  She denies rhinorrhea, nasal congestion, and postnasal drip.  She has not had any sinus infections since we last saw her.  She continues to avoid sesame, peanuts, and tree nuts without any accidental ingestion or use of her Auvi-Q device.  She is interested in getting peanuts added back into her diet when she is no longer pregnant.  Discussed how her blood work for peanut is from May 05, 2020 and that it may be good to get updated lab work and skin testing before we discuss an oral challenge.  She reports that her last skin testing to peanut was with her previous allergy group.  She does have concerns about coming off antihistamines for 3 days for skin  testing.  She is worried about being itchy.  Dermatographia is him is reported as moderately controlled with Zyrtec 10 mg 1-2 times a day.  She mentions that the tingling lips and how her face felt sunburned and super dry has gotten better since she last spoke with Dr. Selena Batten.  She is not sure what caused this, but reports that she had some kind of reaction to something.  While she had this going on she used hydrocortisone and Aquaphor lotion frequently.  She also mentions at the time that her lips were puffy.  She did not get a picture as  recommended by Dr. Selena Batten.  Heartburn is reported as controlled with no medications at this time.  Assessment and Plan: Irlanda is a 35 y.o. female with: Patient Instructions  Asthma Consider changing to [facility] or Advair HFA at your next appointment due to insurance coverage. Symbicort caused back pain, abdominal cramps, and reflux Daily controller medication(s): continue Breo 1 puff once a day and rinse mouth after each use. Continue Singulair (montelukast) 10mg  daily at night. May increase to Breo 1 puff once a day if you are having symptoms more than twice per week! May use albuterol rescue inhaler 2 puffs every 4 to 6 hours as needed for shortness of breath, chest tightness, coughing, and wheezing. May use albuterol rescue inhaler 2 puffs 5 to 15 minutes prior to strenuous physical activities. Monitor frequency of use.  Asthma control goals:  Full participation in all desired activities (may need albuterol before activity) Albuterol use two times or less a week on average (not counting use with activity) Cough interfering with sleep two times or less a month Oral steroids no more than once a year No hospitalizations  Seasonal and perennial allergic rhinoconjunctivitis (Dymista caused nausea) Continue Singulair (montelukast) 10mg  daily at night. Continue with Zyrtec (cetirizine) 10mg  1-2 times a day as needed.  Consider allergy injections for long term control if above medications do not help the symptoms - when no longer pregnant.  Food allergies: Continue to avoid sesame, peanuts and tree nuts. In case of an allergic reaction, give Benadryl 4 teaspoonfuls every 4 hours, and if life-threatening symptoms occur, inject with AuviQ 0.3 mg. Consider skin testing to these allergens. You will need to be off all antihistamines 3 days prior to this appointment   Dermatographism   Continue proper skin care. Send me pictures of the rash. Continue with Zyrtec (cetirizine) 10mg  1-2  times a day as needed.   Heartburn Okay to take famotidine 20mg  once a day during pregnancy as needed.  Follow up in 2 months or sooner if needed.  Reducing Pollen Exposure Pollen seasons: trees (spring), grass (summer) and ragweed/weeds (fall). Keep windows closed in your home and car to lower pollen exposure.  Install air conditioning in the bedroom and throughout the house if possible.  Avoid going out in dry windy days - especially early morning. Pollen counts are highest between 5 - 10 AM and on dry, hot and windy days.  Save outside activities for late afternoon or after a heavy rain, when pollen levels are lower.  Avoid mowing of grass if you have grass pollen allergy. Be aware that pollen can also be transported indoors on people and pets.  Dry your clothes in an automatic dryer rather than hanging them outside where they might collect pollen.  Rinse hair and eyes before bedtime. Control of House Dust Mite Allergen Dust mite allergens are a common trigger of allergy and asthma  symptoms. While they can be found throughout the house, these microscopic creatures thrive in warm, humid environments such as bedding, upholstered furniture and carpeting. Because so much time is spent in the bedroom, it is essential to reduce mite levels there.  Encase pillows, mattresses, and box springs in special allergen-proof fabric covers or airtight, zippered plastic covers.  Bedding should be washed weekly in hot water (130 F) and dried in a hot dryer. Allergen-proof covers are available for comforters and pillows that can't be regularly washed.  Wash the allergy-proof covers every few months. Minimize clutter in the bedroom. Keep pets out of the bedroom.  Keep humidity less than 50% by using a dehumidifier or air conditioning. You can buy a humidity measuring device called a hygrometer to monitor this.  If possible, replace carpets with hardwood, linoleum, or washable area rugs. If that's not  possible, vacuum frequently with a vacuum that has a HEPA filter. Remove all upholstered furniture and non-washable window drapes from the bedroom. Remove all non-washable stuffed toys from the bedroom.  Wash stuffed toys weekly. Mold Control Mold and fungi can grow on a variety of surfaces provided certain temperature and moisture conditions exist.  Outdoor molds grow on plants, decaying vegetation and soil. The major outdoor mold, Alternaria and Cladosporium, are found in very high numbers during hot and dry conditions. Generally, a late summer - fall peak is seen for common outdoor fungal spores. Rain will temporarily lower outdoor mold spore count, but counts rise rapidly when the rainy period ends. The most important indoor molds are Aspergillus and Penicillium. Dark, humid and poorly ventilated basements are ideal sites for mold growth. The next most common sites of mold growth are the bathroom and the kitchen. Outdoor (Seasonal) Mold Control Use air conditioning and keep windows closed. Avoid exposure to decaying vegetation. Avoid leaf raking. Avoid grain handling. Consider wearing a face mask if working in moldy areas.  Indoor (Perennial) Mold Control  Maintain humidity below 50%. Get rid of mold growth on hard surfaces with water, detergent and, if necessary, 5% bleach (do not mix with other cleaners). Then dry the area completely. If mold covers an area more than 10 square feet, consider hiring an indoor environmental professional. For clothing, washing with soap and water is best. If moldy items cannot be cleaned and dried, throw them away. Remove sources e.g. contaminated carpets. Repair and seal leaking roofs or pipes. Using dehumidifiers in damp basements may be helpful, but empty the water and clean units regularly to prevent mildew from forming. All rooms, especially basements, bathrooms and kitchens, require ventilation and cleaning to deter mold and mildew growth. Avoid carpeting  on concrete or damp floors, and storing items in damp areas. Cockroach Allergen Avoidance Cockroaches are often found in the homes of densely populated urban areas, schools or commercial buildings, but these creatures can lurk almost anywhere. This does not mean that you have a dirty house or living area. Block all areas where roaches can enter the home. This includes crevices, wall cracks and windows.  Cockroaches need water to survive, so fix and seal all leaky faucets and pipes. Have an exterminator go through the house when your family and pets are gone to eliminate any remaining roaches. Keep food in lidded containers and put pet food dishes away after your pets are done eating. Vacuum and sweep the floor after meals, and take out garbage and recyclables. Use lidded garbage containers in the kitchen. Wash dishes immediately after use and clean under  stoves, refrigerators or toasters where crumbs can accumulate. Wipe off the stove and other kitchen surfaces and cupboards regularly.   Return in about 2 months (around 12/30/2021), or if symptoms worsen or fail to improve, for skin testing.  No orders of the defined types were placed in this encounter.  Lab Orders  No laboratory test(s) ordered today    Diagnostics: None.  Medication List:  Current Outpatient Medications  Medication Sig Dispense Refill   albuterol (VENTOLIN HFA) 108 (90 Base) MCG/ACT inhaler Inhale 2 puffs into the lungs every 4 (four) hours as needed for wheezing or shortness of breath. 18 g 1   aspirin 81 MG chewable tablet Chew 1 tablet by mouth daily.     cetirizine (ZYRTEC) 10 MG tablet Take 10 mg by mouth daily as needed.     Continuous Blood Gluc Sensor (DEXCOM G6 SENSOR) MISC 1 Device by Does not apply route as directed. 9 each 3   Continuous Blood Gluc Transmit (DEXCOM G6 TRANSMITTER) MISC 1 Device by Does not apply route as directed. 1 each 3   Docusate Sodium (COLACE PO) Take by mouth.     EPINEPHrine (AUVI-Q)  0.3 mg/0.3 mL IJ SOAJ injection Inject 0.3 mg into the muscle as needed for anaphylaxis. 1 each 2   fluticasone furoate-vilanterol (BREO ELLIPTA) 200-25 MCG/INH AEPB Inhale 1 puff into the lungs daily. Rinse mouth after each use. 60 each 3   Glucagon 1 MG/0.2ML SOAJ Inject 1 mg into the skin as directed. 0.2 mL 1   HUMALOG 100 UNIT/ML injection USE 40 MAX UNITS DAILY IN INSULIN PUMP 20 mL 4   Microlet Lancets MISC      montelukast (SINGULAIR) 10 MG tablet Take 1 tablet (10 mg total) by mouth daily. 90 tablet 1   NON FORMULARY Continuous glucose monitor     Prenatal Vit-Fe Fumarate-FA (PRENATAL MULTIVITAMIN) TABS tablet Take 1 tablet by mouth daily at 12 noon.     Probiotic Product (PROBIOTIC DAILY PO) Take by mouth.     fluticasone furoate-vilanterol (BREO ELLIPTA) 100-25 MCG/INH AEPB Inhale 1 puff into the lungs daily. Rinse mouth after each use. 60 each 5   No current facility-administered medications for this visit.   Allergies: Allergies  Allergen Reactions   Molds & Smuts Itching and Shortness Of Breath   Shellfish Allergy Shortness Of Breath and Rash    Crab causes SOB, Scallops cause rash.  Can eat Shrimp and lobster.   Acetaminophen Other (See Comments)    Effects blood sugars and insulin pump.   Cephalexin Nausea Only and Other (See Comments)    Headaches, dizziness   Pneumococcal Vaccines Swelling    Local injection swelling   Prednisone Nausea Only and Other (See Comments)    Headaches, dizziness, Prefers not to take because of uncontrollable BG's on this med;      Soy Allergy Other (See Comments)    Blood test   Adhesive [Tape] Rash    With extended exposure   Dust Mite Extract Rash   Peanut-Containing Drug Products Rash   I reviewed her past medical history, social history, family history, and environmental history and no significant changes have been reported from previous visit on October 12th, 2022.  Review of Systems  Constitutional:  Negative for chills and  fever.  Eyes:        Denies itchy watery eyes  Respiratory:  Negative for cough, chest tightness, shortness of breath and wheezing.   Cardiovascular:  Negative for chest pain and palpitations.  Gastrointestinal:        Reports heartburn and reflux and heart burn under control  Genitourinary:  Negative for difficulty urinating.  Allergic/Immunologic: Positive for environmental allergies and food allergies.  Neurological:  Negative for headaches.  Objective: Physical Exam Not obtained as encounter was done via telephone.   Previous notes and tests were reviewed.  I discussed the assessment and treatment plan with the patient. The patient was provided an opportunity to ask questions and all were answered. The patient agreed with the plan and demonstrated an understanding of the instructions.   The patient was advised to call back or seek an in-person evaluation if the symptoms worsen or if the condition fails to improve as anticipated.  I provided 34 minutes of non-face-to-face time during this encounter.  It was my pleasure to participate in [place]'s care today. Please feel free to contact me with any questions or concerns.   Sincerely,  Nehemiah Settle, FNP

## 2021-11-10 ENCOUNTER — Telehealth (INDEPENDENT_AMBULATORY_CARE_PROVIDER_SITE_OTHER): Payer: BC Managed Care – PPO | Admitting: Cardiology

## 2021-11-10 ENCOUNTER — Encounter: Payer: Self-pay | Admitting: Cardiology

## 2021-11-10 ENCOUNTER — Other Ambulatory Visit: Payer: Self-pay

## 2021-11-10 VITALS — BP 121/70 | HR 80 | Ht 62.0 in | Wt 131.0 lb

## 2021-11-10 DIAGNOSIS — E782 Mixed hyperlipidemia: Secondary | ICD-10-CM | POA: Diagnosis not present

## 2021-11-10 DIAGNOSIS — E1065 Type 1 diabetes mellitus with hyperglycemia: Secondary | ICD-10-CM | POA: Diagnosis not present

## 2021-11-10 DIAGNOSIS — O1490 Unspecified pre-eclampsia, unspecified trimester: Secondary | ICD-10-CM | POA: Diagnosis not present

## 2021-11-10 NOTE — Progress Notes (Addendum)
Cardio-Obstetrics Clinic  Follow Up Note   Date:  11/10/2021   ID:  Nicole Carrillo, DOB [birth year], MRN [id number]  PCP:  Natalia Leatherwood, DO   [facility] Providers Cardiologist:  Thomasene Ripple, DO  Electrophysiologist:  None        Virtual Visit via Video  Note . I connected with the patient today by a   video enabled telemedicine application and verified that I am speaking with the correct person using two identifiers.  The patient is at home. I am in the clinic.  Referring MD: Natalia Leatherwood, DO   Chief Complaint: " I am doing fine"  History of Present Illness:    Nicole Carrillo is a 35 y.o. female [G2P1001] who returns for follow up of  asthma, preeclampsia, postpartum hypertension, hyperlipidemia, Hashimoto's thyroiditis and type 1 diabetes diagnosed at 53 years,Asthma is here today for a follow up.    I saw the patient on 09/12/2020 at that she reported chest pain, I send the patient for Stress echo and placed a 14 day monitor due to palpitation. I also recommended that patient to behavior health for postpartum depression.    I saw the patient on December 30, 2020 at that time we talked about her testing results.  She was closer to her baseline.  She was back in school.  And contemplating having another child.   I saw the patient on Apr 24, 2021 at that time we talked about her recent pregnancy.  She was 7 weeks which started her on her aspirin 81 mg daily.  Since I saw her she has not had any problems.  She is doing well from a cardiovascular standpoint.  At here visit on 08/18/2021    Prior CV Studies Reviewed: The following studies were reviewed today: Stress echo IMPRESSIONS    1. This is a negative stress echocardiogram for ischemia.   2. Excellent exercise capacity (11:47 min:s; 13.4 METS). Normal BP/HR  response to exercise.   3. This is a low risk study.   FINDINGS   Exam Protocol: The patient exercised on a treadmill according to a Bruce  protocol.      Patient Performance: The patient exercised for 11 minutes and 47 seconds,  achieving 13.4 METS. The maximum stage achieved was V of the Bruce  protocol. The baseline heart rate was 103 bpm. The heart rate at peak  stress was 187 bpm. The target heart rate  was calculated to be 158 bpm. The percentage of maximum predicted heart  rate achieved was 100.8 %. The baseline blood pressure was 152/90 mmHg.  The blood pressure at peak stress was 211/74 mmHg. The blood pressure  response was normal. The patient  developed fatigue during the stress exam. The patient's functional  capacity was excellent.     EKG: Resting EKG showed normal sinus rhythm with no abnormal findings. The  patient developed no abnormal EKG findings during exercise.      Zio monitor The patient wore the monitor for 6 days 22 hours starting September 12, 2020. Indication: Palpitations.   The minimum heart rate was 53 bpm, maximum heart rate was 162  bpm, and average heart rate was 85 bpm. Predominant underlying rhythm was Sinus Rhythm.     Premature atrial complexes were rare less than 1%. Premature Ventricular complexes rare less than 1%.   No ventricular tachycardia, no pauses, No AV block, no supraventricular tachycardia and no atrial fibrillation present. 4 patient triggered events and 2 diary events  noted: 1 associated with premature atrial complex, the remaining associated with sinus rhythm and sinus tachycardia.   Conclusion: Unremarkable study with no evidence of significant arrhythmia.  Past Medical History:  Diagnosis Date   Adverse food reaction 04/28/2020   Allergy    Angio-edema    Anxiety    Asthma    Chicken pox    Constipation    Diabetes mellitus type 1 (HCC)    Dysphagia    Dysthymia    Eczema    Elevated platelet count    Fetal CPAM    Folliculitis    GERD (gastroesophageal reflux disease)    Hashimoto's thyroiditis    History of migraine    Hyperlipidemia    Insulin pump in place     Left breast mass 01/14/2017   fibroadenoma- benign   OCD (obsessive compulsive disorder)    Orthodontics    permanent upper and lower retainers   Preeclampsia 02/24/2020   PVD (posterior vitreous detachment), right    Rectal bleeding    Rectal fissure    Tinnitus    Urticaria    Wears contact lenses     Past Surgical History:  Procedure Laterality Date   DENTAL SURGERY     ENDOSCOPIC CONCHA BULLOSA RESECTION Bilateral 07/24/2018   Procedure: ENDOSCOPIC CONCHA BULLOSA MIDDLE TURBINATE;  Surgeon: Vernie Murders, MD;  Location: [facility];  Service: ENT;  Laterality: Bilateral;  Diabetic - insulin pump   SEPTOPLASTY N/A 07/24/2018   Procedure: SEPTOPLASTY;  Surgeon: Vernie Murders, MD;  Location: [facility];  Service: ENT;  Laterality: N/A;  NEEDS TO BE FIRST INSULIN PUMP   TURBINATE REDUCTION Bilateral 07/24/2018   Procedure: INFERIOR TURBINATE REDUCTION;  Surgeon: Vernie Murders, MD;  Location: [facility];  Service: ENT;  Laterality: Bilateral;   WISDOM TOOTH EXTRACTION        OB History     Gravida  2   Para  1   Term  1   Preterm      AB      Living  1      SAB      IAB      Ectopic      Multiple  0   Live Births  1               Current Medications: Current Meds  Medication Sig   albuterol (VENTOLIN HFA) 108 (90 Base) MCG/ACT inhaler Inhale 2 puffs into the lungs every 4 (four) hours as needed for wheezing or shortness of breath.   aspirin 81 MG chewable tablet Chew 1 tablet by mouth daily.   cetirizine (ZYRTEC) 10 MG tablet Take 10 mg by mouth daily as needed.   Continuous Blood Gluc Sensor (DEXCOM G6 SENSOR) MISC 1 Device by Does not apply route as directed.   Continuous Blood Gluc Transmit (DEXCOM G6 TRANSMITTER) MISC 1 Device by Does not apply route as directed.   Docusate Sodium (COLACE PO) Take by mouth.   EPINEPHrine (AUVI-Q) 0.3 mg/0.3 mL IJ SOAJ injection Inject 0.3 mg into the muscle as needed for anaphylaxis.    fluticasone furoate-vilanterol (BREO ELLIPTA) 100-25 MCG/INH AEPB Inhale 1 puff into the lungs daily. Rinse mouth after each use.   fluticasone furoate-vilanterol (BREO ELLIPTA) 200-25 MCG/INH AEPB Inhale 1 puff into the lungs daily. Rinse mouth after each use.   Glucagon 1 MG/0.2ML SOAJ Inject 1 mg into the skin as directed.   HUMALOG 100 UNIT/ML injection USE 40 MAX UNITS DAILY IN  INSULIN PUMP   Microlet Lancets MISC    montelukast (SINGULAIR) 10 MG tablet Take 1 tablet (10 mg total) by mouth daily.   nitrofurantoin, macrocrystal-monohydrate, (MACROBID) 100 MG capsule Take 100 mg by mouth at bedtime.   NON FORMULARY Continuous glucose monitor   Prenatal Vit-Fe Fumarate-FA (PRENATAL MULTIVITAMIN) TABS tablet Take 1 tablet by mouth daily at 12 noon.   Probiotic Product (PROBIOTIC DAILY PO) Take by mouth.     Allergies:   Molds & smuts, Shellfish allergy, Acetaminophen, Cephalexin, Pneumococcal vaccines, Prednisone, Soy allergy, Adhesive [tape], Dust mite extract, and Peanut-containing drug products   Social History   Socioeconomic History   Marital status: Married    Spouse name: Not on file   Number of children: Not on file   Years of education: Not on file   Highest education level: Not on file  Occupational History   Not on file  Tobacco Use   Smoking status: Never   Smokeless tobacco: Never  Vaping Use   Vaping Use: Never used  Substance and Sexual Activity   Alcohol use: Not Currently    Alcohol/week: 1.0 standard drink    Types: 1 Glasses of wine per week   Drug use: Not Currently   Sexual activity: Yes    Partners: Male    Birth control/protection: None  Other Topics Concern   Not on file  Social History Narrative   Marital status/children/pets: Married. Soon to have 1 son.    Education/employment: MSN, Nurse.    Safety:      -smoke alarm in the home:Yes     - wears seatbelt: Yes     - Feels safe in their relationships: Yes   Social Determinants of Health    Financial Resource Strain: Not on file  Food Insecurity: Unknown   Worried About Programme researcher, broadcasting/film/video in the Last Year: Never true   Ran Out of Food in the Last Year: Not on file  Transportation Needs: Not on file  Physical Activity: Not on file  Stress: Not on file  Social Connections: Not on file      Family History  Problem Relation Age of Onset   Arthritis Mother    Stroke Mother    Miscarriages / [place] Mother    Arthritis Father    Dementia Maternal Grandmother    Emphysema Maternal Grandfather       ROS:   Please see the history of present illness.     All other systems reviewed and are negative.   Labs/EKG Reviewed:    EKG:   EKG is was not ordered today.    Recent Labs: 03/07/2021: Hemoglobin 13.5; Platelets 441.0 04/05/2021: ALT 15; BUN 14; Creatinine, Ser 0.69; Potassium 4.2; Sodium 135; TSH 1.18   Recent Lipid Panel Lab Results  Component Value Date/Time   CHOL 160 05/16/2021 01:32 PM   TRIG 109.0 05/16/2021 01:32 PM   HDL 108.20 05/16/2021 01:32 PM   CHOLHDL 1 05/16/2021 01:32 PM   LDLCALC 30 05/16/2021 01:32 PM    Physical Exam:    VS:  BP 121/70   Pulse 80   Ht 5\' 2"  (1.575 m)   Wt 131 lb (59.4 kg)   LMP 03/01/2021 (Exact Date)   BMI 23.96 kg/m     Wt Readings from Last 3 Encounters:  11/10/21 131 lb (59.4 kg)  08/18/21 119 lb 9.6 oz (54.3 kg)  05/16/21 109 lb (49.4 kg)     GEN:  Well nourished, well developed in no  acute distress HEENT: Normal NECK: No JVD; No carotid bruits LYMPHATICS: No lymphadenopathy CARDIAC: RRR, no murmurs, rubs, gallops RESPIRATORY:  Clear to auscultation without rales, wheezing or rhonchi  ABDOMEN: Soft, non-tender, non-distended MUSCULOSKELETAL:  No edema; No deformity  SKIN: Warm and dry NEUROLOGIC:  Alert and oriented x 3 PSYCHIATRIC:  Normal affect    Risk Assessment/Risk Calculators:     CARPREG II Risk Prediction Index Score:  1.  The patient's risk for a primary cardiac event is 5%.             ASSESSMENT & PLAN:    History of Postpartum hypertension  History of Preclampsia Type 1 diabetes uses insulin pump High risk pregnancy Hyperlipidemia  Cardiovascular standpoint she appears to be doing well.  Her blood pressure is acceptable.  We will are going to have to keep a close eye on this patient given her heart rates with development for postpartum hypertension.  She has had postpartum hypertension in the past.  I asked the patient to continue to take her blood pressure within the next few weeks as she get ready for delivery.  And definitely continue her blood pressure monitoring when she delivers. We discussed and it would be in her best interest if she is discharged on nifedipine 30 mg daily after delivery if her blood pressure is 130/80 mmHg.  Please avoid enalapril in this patient she has had this in the past and did not do well with this medication.  For okay for low-dose diuretics in this patient postpartum if there is significant hemodynamic fluid shifts. She plans to breast-feed as well.    There are no Patient Instructions on file for this visit.   Dispo:  No follow-ups on file.   Medication Adjustments/Labs and Tests Ordered: Current medicines are reviewed at length with the patient today.  Concerns regarding medicines are outlined above.  Tests Ordered: No orders of the defined types were placed in this encounter.  Medication Changes: No orders of the defined types were placed in this encounter.

## 2021-11-21 ENCOUNTER — Encounter: Payer: Self-pay | Admitting: Cardiology

## 2021-12-05 IMAGING — US US MFM OB DETAIL+14 WK
1 series · 12 of 28 positions shown · non-contrast
Comparison: none

[Series 1: us mfm ob detail+14 wk · 12 of 96 slices shown]
[im 4/96]
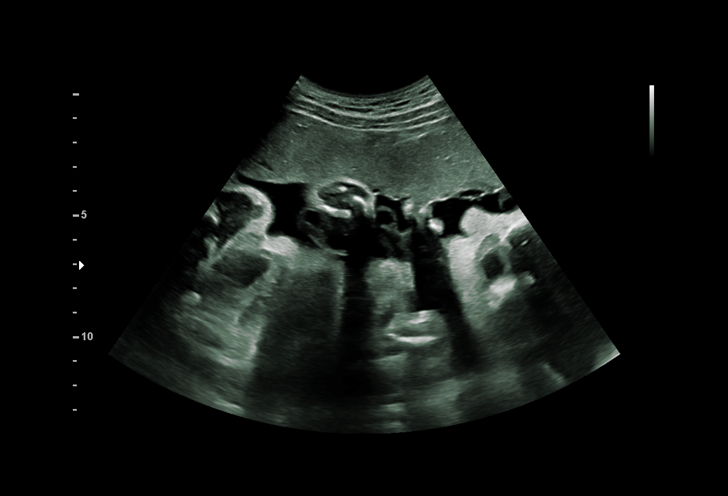
[im 11/96]
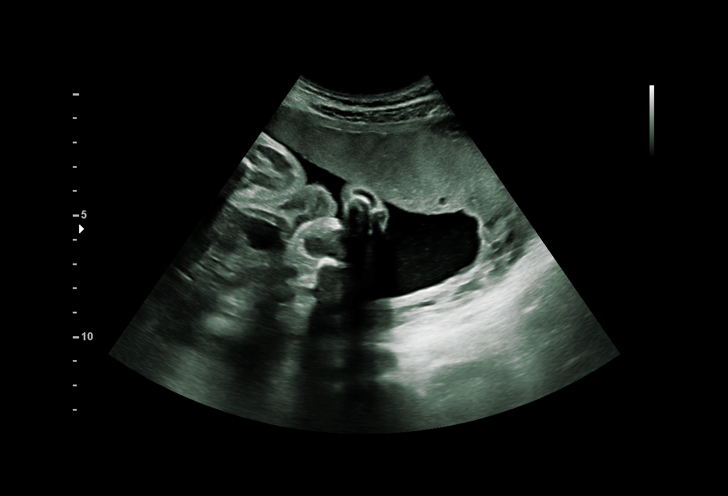
[im 18/96]
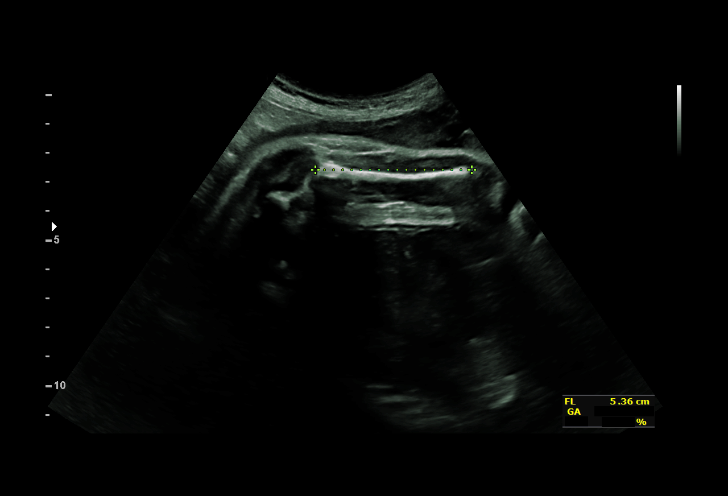
[im 29/96]
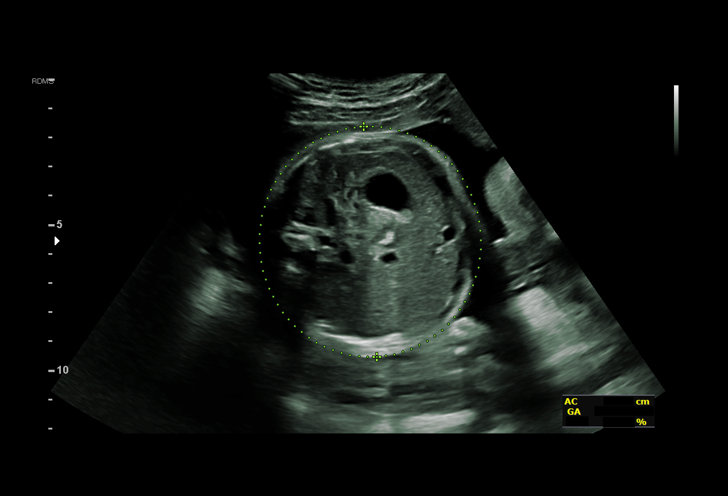
[im 36/96]
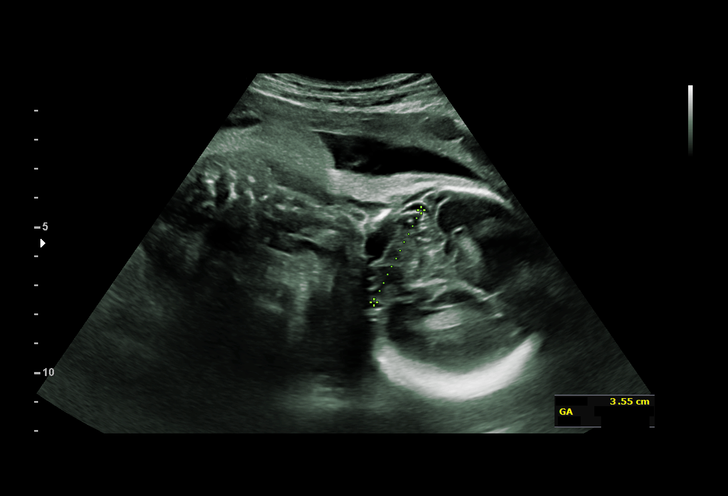
[im 43/96]
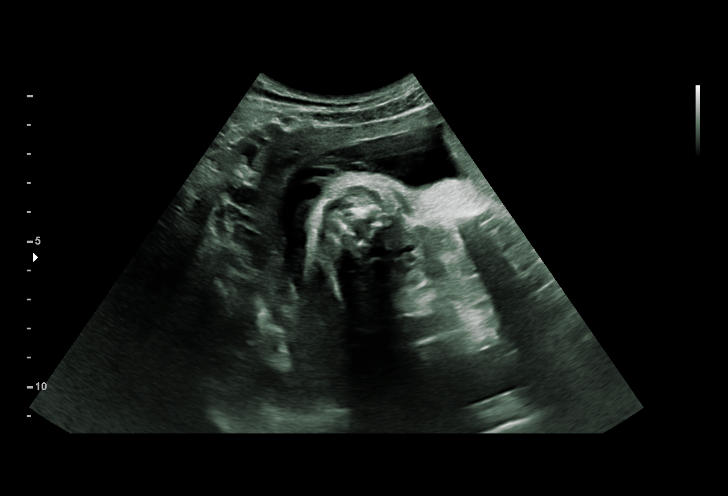
[im 53/96]
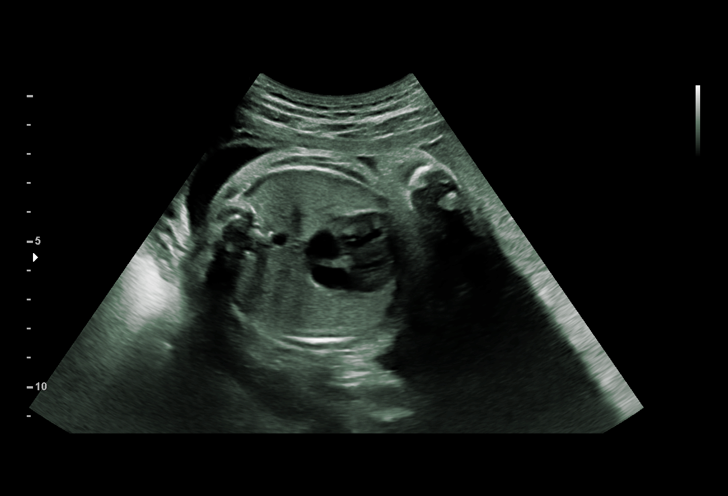
[im 60/96]
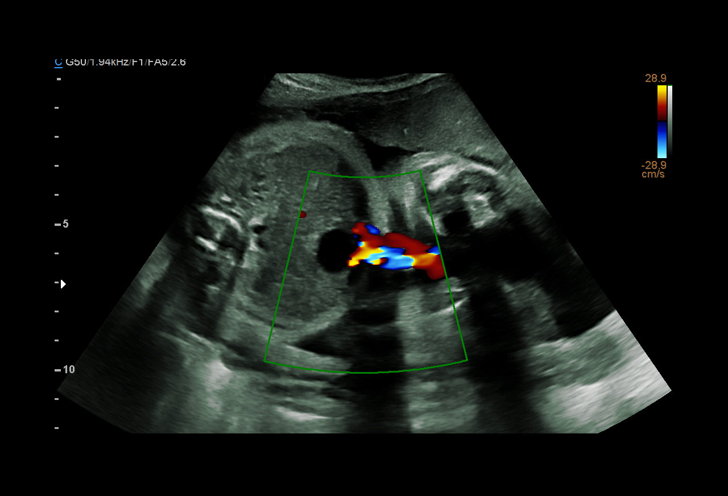
[im 67/96]
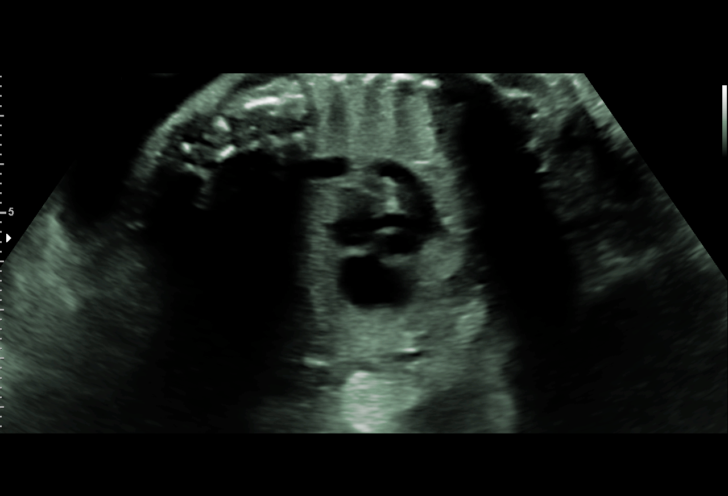
[im 78/96]
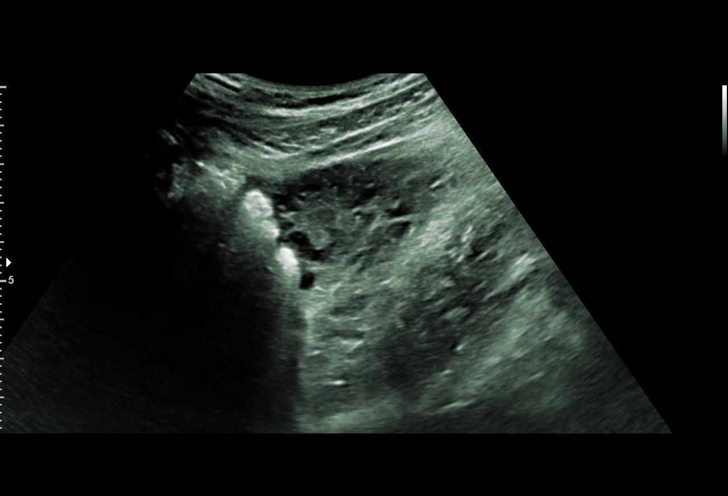
[im 85/96]
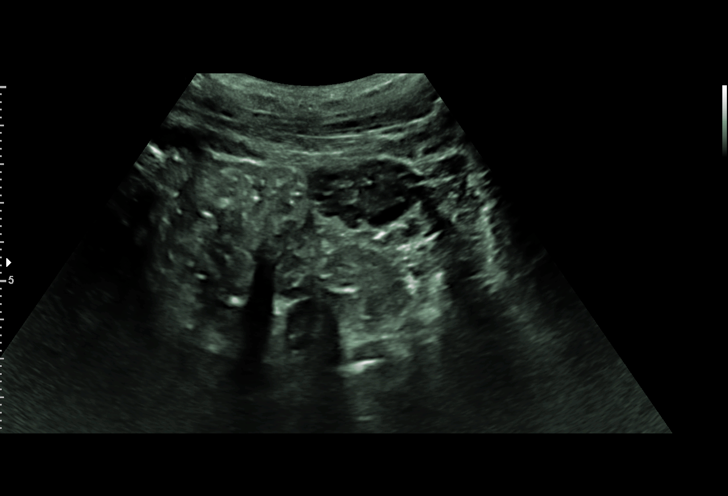
[im 92/96]
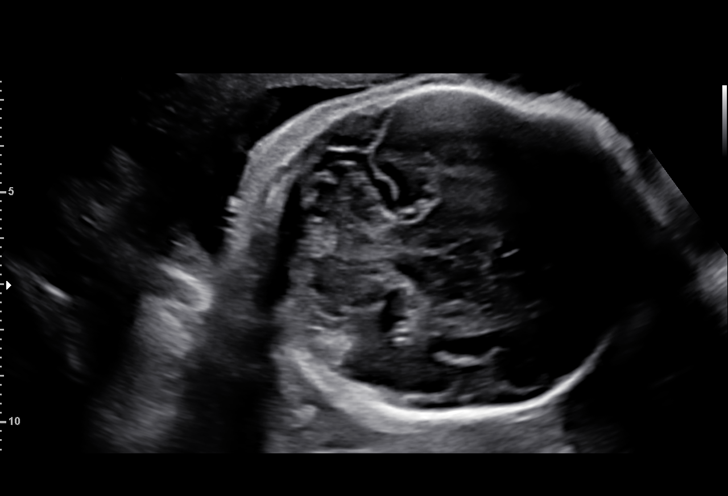

[12 of 28 positions shown; findings below may reference images not displayed]

[address]

 ----------------------------------------------------------------------

 ----------------------------------------------------------------------
Indications

  Diabetes - Pregestational,3rd trimester
  Thyroid disease in pregnancy                   O99.280,
  Gestational diabetes in pregnancy, insulin
  controlled
  Encounter for antenatal screening for
  malformations
  Transferred care - moved to [facility]
  28 weeks gestation of pregnancy
 ----------------------------------------------------------------------
Fetal Evaluation

 Num Of Fetuses:         1
 Fetal Heart Rate(bpm):  148
 Cardiac Activity:       Observed
 Presentation:           Cephalic
 Placenta:               Anterior
 P. Cord Insertion:      Visualized, central

 Amniotic Fluid
 AFI FV:      Within normal limits

                             Largest Pocket(cm)

Biometry

 BPD:      71.3  mm     G. Age:  28w 4d         35  %    CI:        71.73   %    70 - 86
                                                         FL/HC:      20.0   %    19.6 -
 HC:       268   mm     G. Age:  29w 1d         31  %    HC/AC:      1.09        0.99 -
 AC:      245.8  mm     G. Age:  28w 6d         46  %    FL/BPD:     75.2   %    71 - 87
 FL:       53.6  mm     G. Age:  28w 3d         27  %    FL/AC:      21.8   %    20 - 24
 HUM:        49  mm     G. Age:  28w 6d         45  %
 CER:      36.9  mm     G. Age:  31w 6d       > 95  %
 LV:          7  mm
 Est. FW:    9829  gm    2 lb 13 oz      36  %
OB History

 Gravidity:    1
Gestational Age

 Clinical EDD:  28w 1d                                        EDD:   03/08/20
 U/S Today:     28w 5d                                        EDD:   03/04/20
 Best:          28w 5d     Det. By:  U/S (12/16/19)           EDD:   03/04/20
Anatomy

 Cranium:               Appears normal         Aortic Arch:            Appears normal
 Cavum:                 Appears normal         Ductal Arch:            Appears normal
 Ventricles:            Appears normal         Diaphragm:              Appears normal
 Choroid Plexus:        Appears normal         Stomach:                Appears normal, left
                                                                       sided
 Cerebellum:            Appears normal         Abdomen:                Appears normal
 Posterior Fossa:       Appears normal         Abdominal Wall:         Appears nml (cord
                                                                       insert, abd wall)
 Nuchal Fold:           Not applicable (>20    Cord Vessels:           Appears normal (3
                        wks GA)                                        vessel cord)
 Face:                  Orbits appear          Kidneys:                Appear normal
                        normal
 Lips:                  Appears normal         Bladder:                Appears normal
 Thoracic:              Appears normal         Spine:                  Appears normal
 Heart:                 Appears normal         Upper Extremities:      Appears normal Ltd
                        (4CH, axis, and                                RUE
                        situs)
 RVOT:                  Appears normal         Lower Extremities:      Appears normal
 LVOT:                  Appears normal

 Other:  Male gender.
Cervix Uterus Adnexa

 Cervix
 Not visualized (advanced GA >97wks)

 Uterus
 No abnormality visualized.

 Left Ovary
 Within normal limits.

 Right Ovary
 Within normal limits.

 Adnexa
 No abnormality visualized.
Comments

 This patient was seen for an ultrasound due to
 pregestational/type 1 diabetes that is currently treated with an
 insulin pump.  The patient has a continuous glucose monitor.
 Her diabetes is managed by an endocrinologist at the
 [REDACTED] [HOSPITAL].  The patient reports that she has
 had prior prenatal care at [HOSPITAL].  She also reports that she
 has had a normal fetal anatomy scan and fetal
 echocardiogram performed at [HOSPITAL].  She reports that her
 hemoglobin A1c at the time of conception was around 7%.
 She denies any other problems in her current pregnancy.
 She reports that she had a screening test for fetal aneuploidy
 earlier in her pregnancy which was reported as negative.
 She was informed that the fetal growth and amniotic fluid
 level were appropriate for her gestational age.
 There were no obvious fetal anomalies noted on today's
 ultrasound exam.  However, today's exam was limited due to
 the fetal position and her advanced gestational age.
 The patient was informed that anomalies may be missed due
 to technical limitations. If the fetus is in a suboptimal position
 or maternal habitus is increased, visualization of the fetus in
 the maternal uterus may be impaired.
 The implications and management of diabetes in pregnancy
 was discussed in detail with the patient. She was advised that
 getting her blood glucose values as close to the normal range
 would provide her with the most optimal obstetrical outcomes.
 She was advised to continue to follow-up with her
 endocrinologist and to make adjustments to her insulin
 regimen as needed.
 Due to diabetes in pregnancy, a follow-up growth scan was
 scheduled in 4 weeks.  We will also start weekly fetal testing
 at that time.  She was advised that due to diabetes in
 pregnancy, delivery is usually recommended at between 37
 to 39 weeks depending on her glycemic control and the fetal
 growth.

## 2021-12-15 ENCOUNTER — Telehealth: Payer: Self-pay

## 2021-12-15 ENCOUNTER — Telehealth (INDEPENDENT_AMBULATORY_CARE_PROVIDER_SITE_OTHER): Payer: BC Managed Care – PPO | Admitting: Cardiology

## 2021-12-15 ENCOUNTER — Encounter: Payer: Self-pay | Admitting: Cardiology

## 2021-12-15 VITALS — BP 120/82 | HR 69 | Ht 62.0 in | Wt 113.0 lb

## 2021-12-15 DIAGNOSIS — O165 Unspecified maternal hypertension, complicating the puerperium: Secondary | ICD-10-CM | POA: Diagnosis not present

## 2021-12-15 DIAGNOSIS — E1065 Type 1 diabetes mellitus with hyperglycemia: Secondary | ICD-10-CM | POA: Diagnosis not present

## 2021-12-15 NOTE — Telephone Encounter (Signed)
Left voice message for patient to give office a call back to review AVS and to schedule follow up appointment.

## 2021-12-15 NOTE — Progress Notes (Signed)
[facility]  Follow Up Note   Date:  12/15/2021   ID:  Nicole Carrillo, DOB 05-18-1986, MRN [id number]  PCP:  Ma Hillock, DO   [facility] Providers Cardiologist:  Berniece Salines, DO  Electrophysiologist:  None        Referring MD: Ma Hillock, DO   Chief Complaint: " I am doing well"   Virtual Visit via Video  Note . I connected with the patient today by a   video enabled telemedicine application and verified that I am speaking with the correct person using two identifiers.  The patient is at home. I am in the office  History of Present Illness:    Nicole Carrillo is a 36 y.o. female [id number] who returns for follow up of for follow-up visit.  She is postpartum.  Her medical history includes  asthma, preeclampsia, postpartum hypertension, hyperlipidemia, Hashimoto's thyroiditis and type 1 diabetes diagnosed at 36 years old.   She successfully delivered and her baby is doing well.  She has no complaints at this time.  She denies any chest pain or shortness of breath.  She has had 1 episode where her blood pressure was high but this has.  She is running between the 120s over 80 mmHg for blood pressure.  She takes her pressure every day.  Prior CV Studies Reviewed: The following studies were reviewed today:  Stress echo IMPRESSIONS    1. This is a negative stress echocardiogram for ischemia.   2. Excellent exercise capacity (11:47 min:s; 13.4 METS). Normal BP/HR  response to exercise.   3. This is a low risk study.   FINDINGS   Exam Protocol: The patient exercised on a treadmill according to a Bruce  protocol.     Patient Performance: The patient exercised for 11 minutes and 47 seconds,  achieving 13.4 METS. The maximum stage achieved was V of the Bruce  protocol. The baseline heart rate was 103 bpm. The heart rate at peak  stress was 187 bpm. The target heart rate  was calculated to be 158 bpm. The percentage of maximum predicted heart  rate  achieved was 100.8 %. The baseline blood pressure was 152/90 mmHg.  The blood pressure at peak stress was 211/74 mmHg. The blood pressure  response was normal. The patient  developed fatigue during the stress exam. The patient's functional  capacity was excellent.     EKG: Resting EKG showed normal sinus rhythm with no abnormal findings. The  patient developed no abnormal EKG findings during exercise.      Zio monitor The patient wore the monitor for 6 days 22 hours starting September 12, 2020. Indication: Palpitations.   The minimum heart rate was 53 bpm, maximum heart rate was 162  bpm, and average heart rate was 85 bpm. Predominant underlying rhythm was Sinus Rhythm.     Premature atrial complexes were rare less than 1%. Premature Ventricular complexes rare less than 1%.   No ventricular tachycardia, no pauses, No AV block, no supraventricular tachycardia and no atrial fibrillation present. 4 patient triggered events and 2 diary events noted: 1 associated with premature atrial complex, the remaining associated with sinus rhythm and sinus tachycardia.   Conclusion: Unremarkable study with no evidence of significant arrhythmia.   Past Medical History:  Diagnosis Date   Adverse food reaction 04/28/2020   Allergy    Angio-edema    Anxiety    Asthma    Chicken pox    Constipation    Diabetes mellitus  type 1 (HCC)    Dysphagia    Dysthymia    Eczema    Elevated platelet count    Fetal CPAM    Folliculitis    GERD (gastroesophageal reflux disease)    Hashimoto's thyroiditis    History of migraine    Hyperlipidemia    Insulin pump in place    Left breast mass 01/14/2017   fibroadenoma- benign   OCD (obsessive compulsive disorder)    Orthodontics    permanent upper and lower retainers   Preeclampsia 02/24/2020   PVD (posterior vitreous detachment), right    Rectal bleeding    Rectal fissure    Tinnitus    Urticaria    Wears contact lenses     Past Surgical History:   Procedure Laterality Date   DENTAL SURGERY     ENDOSCOPIC CONCHA BULLOSA RESECTION Bilateral 07/24/2018   Procedure: ENDOSCOPIC CONCHA BULLOSA MIDDLE TURBINATE;  Surgeon: Margaretha Sheffield, MD;  Location: [place];  Service: ENT;  Laterality: Bilateral;  Diabetic - insulin pump   SEPTOPLASTY N/A 07/24/2018   Procedure: SEPTOPLASTY;  Surgeon: Margaretha Sheffield, MD;  Location: [place];  Service: ENT;  Laterality: N/A;  NEEDS TO BE FIRST INSULIN PUMP   TURBINATE REDUCTION Bilateral 07/24/2018   Procedure: INFERIOR TURBINATE REDUCTION;  Surgeon: Margaretha Sheffield, MD;  Location: [place];  Service: ENT;  Laterality: Bilateral;   WISDOM TOOTH EXTRACTION        OB History     Gravida  2   Para  1   Term  1   Preterm      AB      Living  1      SAB      IAB      Ectopic      Multiple  0   Live Births  1               Current Medications: Current Meds  Medication Sig   albuterol (VENTOLIN HFA) 108 (90 Base) MCG/ACT inhaler Inhale 2 puffs into the lungs every 4 (four) hours as needed for wheezing or shortness of breath.   cetirizine (ZYRTEC) 10 MG tablet Take 10 mg by mouth daily.   Docusate Sodium (COLACE PO) Take by mouth.   EPINEPHrine (AUVI-Q) 0.3 mg/0.3 mL IJ SOAJ injection Inject 0.3 mg into the muscle as needed for anaphylaxis.   fluticasone furoate-vilanterol (BREO ELLIPTA) 100-25 MCG/INH AEPB Inhale 1 puff into the lungs daily. Rinse mouth after each use.   fluticasone furoate-vilanterol (BREO ELLIPTA) 200-25 MCG/INH AEPB Inhale 1 puff into the lungs daily. Rinse mouth after each use.   Glucagon 1 MG/0.2ML SOAJ Inject 1 mg into the skin as directed.   montelukast (SINGULAIR) 10 MG tablet Take 1 tablet (10 mg total) by mouth daily.   NON FORMULARY Continuous glucose monitor   Prenatal Vit-Fe Fumarate-FA (PRENATAL MULTIVITAMIN) TABS tablet Take 1 tablet by mouth daily at 12 noon.   Probiotic Product (PROBIOTIC DAILY PO) Take by mouth.      Allergies:   Molds & smuts, Shellfish allergy, Acetaminophen, Cephalexin, Pneumococcal vaccines, Prednisone, Soy allergy, Adhesive [tape], Dust mite extract, and Peanut-containing drug products   Social History   Socioeconomic History   Marital status: Married    Spouse name: Not on file   Number of children: Not on file   Years of education: Not on file   Highest education level: Not on file  Occupational History   Not on file  Tobacco  Use   Smoking status: Never   Smokeless tobacco: Never  Vaping Use   Vaping Use: Never used  Substance and Sexual Activity   Alcohol use: Not Currently    Alcohol/week: 1.0 standard drink    Types: 1 Glasses of wine per week   Drug use: Not Currently   Sexual activity: Yes    Partners: Male    Birth control/protection: None  Other Topics Concern   Not on file  Social History Narrative   Marital status/children/pets: Married. Soon to have 1 son.    Education/employment: MSN, Nurse.    Safety:      -smoke alarm in the home:Yes     - wears seatbelt: Yes     - Feels safe in their relationships: Yes   Social Determinants of Health   Financial Resource Strain: Not on file  Food Insecurity: Unknown   Worried About Charity fundraiser in the Last Year: Never true   Ran Out of Food in the Last Year: Not on file  Transportation Needs: Not on file  Physical Activity: Not on file  Stress: Not on file  Social Connections: Not on file      Family History  Problem Relation Age of Onset   Arthritis Mother    Stroke Mother    Miscarriages / [place] Mother    Arthritis Father    Dementia Maternal Grandmother    Emphysema Maternal Grandfather       ROS:   Please see the history of present illness.     All other systems reviewed and are negative.   Labs/EKG Reviewed:    EKG:   EKG is was not ordered today.    Recent Labs: 03/07/2021: Hemoglobin 13.5; Platelets 441.0 04/05/2021: ALT 15; BUN 14; Creatinine, Ser 0.69; Potassium  4.2; Sodium 135; TSH 1.18   Recent Lipid Panel Lab Results  Component Value Date/Time   CHOL 160 05/16/2021 01:32 PM   TRIG 109.0 05/16/2021 01:32 PM   HDL 108.20 05/16/2021 01:32 PM   CHOLHDL 1 05/16/2021 01:32 PM   LDLCALC 30 05/16/2021 01:32 PM    Physical Exam:    VS:  BP 120/82    Pulse 69    Ht 5\' 2"  (1.575 m)    Wt 113 lb (51.3 kg)    LMP 03/01/2021 (Exact Date)    Breastfeeding Unknown    BMI 20.67 kg/m     Wt Readings from Last 3 Encounters:  12/15/21 113 lb (51.3 kg)  11/10/21 131 lb (59.4 kg)  08/18/21 119 lb 9.6 oz (54.3 kg)     GEN:  Well nourished, well developed in no acute distress HEENT: Normal NECK: No JVD; No carotid bruits LYMPHATICS: No lymphadenopathy CARDIAC: RRR, no murmurs, rubs, gallops RESPIRATORY:  Clear to auscultation without rales, wheezing or rhonchi  ABDOMEN: Soft, non-tender, non-distended MUSCULOSKELETAL:  No edema; No deformity  SKIN: Warm and dry NEUROLOGIC:  Alert and oriented x 3 PSYCHIATRIC:  Normal affect    Risk Assessment/Risk Calculators:                 ASSESSMENT & PLAN:    History of Postpartum hypertension  History of Preclampsia Type 1 diabetes uses insulin pump High risk pregnancy Hyperlipidemia  Thankfully she had a successful delivery.  She is doing well so far postpartum.  I have asked the patient continue to take her blood pressure as she is still in the window for exacerbation of any postpartum hypertension.  She expresses understanding.  In a few weeks she will be getting back to school.  We are going to closely monitor the patient low threshold for anxiety.  If she develops postpartum hypertension plan for nifedipine 30 mg daily.  Please avoid enalapril.  She is breast-feeding as well.   She will follow-up in 12 weeks.   Patient Instructions  Medication Instructions:  Your physician recommends that you continue on your current medications as directed. Please refer to the Current Medication list  given to you today.  *If you need a refill on your cardiac medications before your next appointment, please call your pharmacy*  Lab Work: NONE ordered at this time of appointment   If you have labs (blood work) drawn today and your tests are completely normal, you will receive your results only by: [place] (if you have MyChart) OR A paper copy in the mail If you have any lab test that is abnormal or we need to change your treatment, we will call you to review the results.  Testing/Procedures: NONE ordered at this time of appointment   Follow-Up: At [facility], you and your health needs are our priority.  As part of our continuing mission to provide you with exceptional heart care, we have created designated Provider Care Teams.  These Care Teams include your primary Cardiologist (physician) and Advanced Practice Providers (APPs -  Physician Assistants and Nurse Practitioners) who all work together to provide you with the care you need, when you need it.  Your next appointment:   12 week(s)  The format for your next appointment:   In Person  Provider:   Berniece Salines, DO    Other Instructions     Dispo:  Return in about 12 weeks (around 03/09/2022).   Medication Adjustments/Labs and Tests Ordered: Current medicines are reviewed at length with the patient today.  Concerns regarding medicines are outlined above.  Tests Ordered: No orders of the defined types were placed in this encounter.  Medication Changes: No orders of the defined types were placed in this encounter.

## 2021-12-15 NOTE — Patient Instructions (Signed)
Medication Instructions:  Your physician recommends that you continue on your current medications as directed. Please refer to the Current Medication list given to you today.  *If you need a refill on your cardiac medications before your next appointment, please call your pharmacy*  Lab Work: NONE ordered at this time of appointment   If you have labs (blood work) drawn today and your tests are completely normal, you will receive your results only by: [place] (if you have MyChart) OR A paper copy in the mail If you have any lab test that is abnormal or we need to change your treatment, we will call you to review the results.  Testing/Procedures: NONE ordered at this time of appointment   Follow-Up: At [facility], you and your health needs are our priority.  As part of our continuing mission to provide you with exceptional heart care, we have created designated Provider Care Teams.  These Care Teams include your primary Cardiologist (physician) and Advanced Practice Providers (APPs -  Physician Assistants and Nurse Practitioners) who all work together to provide you with the care you need, when you need it.  Your next appointment:   12 week(s)  The format for your next appointment:   In Person  Provider:   Berniece Salines, DO    Other Instructions

## 2021-12-28 ENCOUNTER — Encounter: Payer: Self-pay | Admitting: Allergy

## 2021-12-28 MED ORDER — FLUTICASONE FUROATE-VILANTEROL 100-25 MCG/ACT IN AEPB: 1.0000 | INHALATION_SPRAY | Freq: Every day | RESPIRATORY_TRACT | 5 refills | Status: DC

## 2021-12-29 LAB — BASIC METABOLIC PANEL: Glucose: 281

## 2021-12-29 LAB — HEMOGLOBIN A1C: Hemoglobin A1C: 7.2

## 2022-01-01 ENCOUNTER — Other Ambulatory Visit: Payer: Self-pay

## 2022-01-01 ENCOUNTER — Encounter: Payer: Self-pay | Admitting: Allergy

## 2022-01-01 ENCOUNTER — Ambulatory Visit: Payer: BC Managed Care – PPO | Admitting: Allergy

## 2022-01-01 VITALS — BP 110/68 | HR 72 | Temp 98.4°F | Resp 18 | Ht 62.0 in | Wt 113.6 lb

## 2022-01-01 DIAGNOSIS — H1013 Acute atopic conjunctivitis, bilateral: Secondary | ICD-10-CM

## 2022-01-01 DIAGNOSIS — J4540 Moderate persistent asthma, uncomplicated: Secondary | ICD-10-CM

## 2022-01-01 DIAGNOSIS — T783XXD Angioneurotic edema, subsequent encounter: Secondary | ICD-10-CM

## 2022-01-01 DIAGNOSIS — J302 Other seasonal allergic rhinitis: Secondary | ICD-10-CM

## 2022-01-01 DIAGNOSIS — R21 Rash and other nonspecific skin eruption: Secondary | ICD-10-CM | POA: Insufficient documentation

## 2022-01-01 DIAGNOSIS — L503 Dermatographic urticaria: Secondary | ICD-10-CM

## 2022-01-01 DIAGNOSIS — R12 Heartburn: Secondary | ICD-10-CM | POA: Diagnosis not present

## 2022-01-01 DIAGNOSIS — T781XXD Other adverse food reactions, not elsewhere classified, subsequent encounter: Secondary | ICD-10-CM

## 2022-01-01 DIAGNOSIS — H1010 Acute atopic conjunctivitis, unspecified eye: Secondary | ICD-10-CM

## 2022-01-01 DIAGNOSIS — T7840XD Allergy, unspecified, subsequent encounter: Secondary | ICD-10-CM

## 2022-01-01 DIAGNOSIS — J3089 Other allergic rhinitis: Secondary | ICD-10-CM

## 2022-01-01 DIAGNOSIS — T7840XA Allergy, unspecified, initial encounter: Secondary | ICD-10-CM | POA: Insufficient documentation

## 2022-01-01 NOTE — Assessment & Plan Note (Addendum)
·   Continue proper skin care.  Zyrtec (cetirizine) 10mg  twice a day.

## 2022-01-01 NOTE — Progress Notes (Signed)
Follow Up Note  RE: [name] MRN: [id number] DOB: 06-23-1986 Date of Office Visit: 01/01/2022  Referring provider: Ma Hillock, DO Primary care provider: Ma Hillock, DO  Chief Complaint: Allergy Testing (Allergy Testing hasn't been off antihistamine )  History of Present Illness: I had the pleasure of seeing Nicole Carrillo for a follow up visit at the [facility] on 01/01/2022. She is a 36 y.o. female, who is being followed for asthma, allergic rhinoconjunctivitis, food allergies, dermatographism and heartburn. Her previous allergy office visit was on 10/30/2021 with Althea Charon FNP via telemedicine. Today is a skin testing and follow up visit.  Asthma  Patient had a new baby in December via C-section.  She had some chest pressure during it and was told it may have been her anxiety.  Currently taking Breo 135mcg 1 puff once a day and Singulair 10mg  daily. No albuterol use and not needed to increase Breo to 265mcg.  Denies any SOB, coughing, wheezing, chest tightness, nocturnal awakenings, ER/urgent care visits or prednisone use since the last visit.   Allergic reactions? Patient noted some throat discomfort.  Denies any PND.  Denies any reflux/heartburn symptoms. She has not been taking famotidine.  Patient sometimes breaks out in facial erythema when she has these throat discomfort.  She also had 1 episode of lip swelling.  This started happening about a few months ago. Individual rashes lasts about a few hours.   Frequency of episodes: not on a daily basis. Suspected triggers are unknown. Denies any fevers, chills, changes in medications, foods, personal care products or recent infections.  Previous history of rash/hives: no.  Seasonal and perennial allergic rhinoconjunctivitis  Currently taking Singulair (montelukast) 10mg  daily at night and Zyrtec (cetirizine) 10mg  daily. Unable to come off zyrtec as it causes itching.   Food  allergies Currently avoiding sesame, peanuts, tree nuts and most soy products.  Dermatographism   Patient states the above rashes for different than the dermatographism.   Assessment and Plan: Nicole Carrillo is a 36 y.o. female with: Asthma Past history - Diagnosed with asthma in 2018 by pulmonology via  Methacholine challenge test - data not available for review. Tried Arnuity, Breo with no benefit. No triggers noted. Normal EGD in past per patient report. 2021 spirometry was normal with marginal improvement in FEV1 post bronchodilator treatment. Clinically feeling about the same. Interim history - delivered baby. Doing well with Breo 140mcg and Singulair.  Normal spirometry today. Daily controller medication(s): Breo 169mcg 1 puff once a day and rinse mouth after each use.  Continue Singulair (montelukast) 10mg  daily at night. During upper respiratory infections/asthma flares:  Start Breo 251mcg 1 puff once a day for 1-2 weeks until your breathing symptoms return to baseline.  Pretreat with albuterol 2 puffs. May use albuterol rescue inhaler 2 puffs every 4 to 6 hours as needed for shortness of breath, chest tightness, coughing, and wheezing. May use albuterol rescue inhaler 2 puffs 5 to 15 minutes prior to strenuous physical activities. Monitor frequency of use.  Get spirometry at next visit.  Allergic reaction? Noted facial erythema and throat discomfort and one episode of lip swelling. Concerned about allergic reactions to something new. Denies any PND, reflux symptoms. These episodes last for a few hours and occur randomly. No triggers noted.  Continue proper skin care. Keep track of episodes and symptoms. Write down what you had come across that day.  Increase Zyrtec (cetirizine) 10mg  to twice a day. If you have  issues with drowsiness or decreased breastmilk production then let us know.  Get bloodwork as below.   Seasonal and perennial allergic rhinoconjunctivitis Past history -  Perennial rhino conjunctivitis symptoms for the past 3 years. 2018 skin testing by ENT was positive to grass, ragweed, weed, trees, molds, dust mites, cockroach, horse. SLIT therapy by ENT x few months with no benefit. Deviated septum repair and turbinate reduction in 2019. 2021 bloodwork positive to dust mites, grass pollen, mold, tree pollen, ragweed pollen and weed pollen. Dymista caused nausea.  Interim history - stable. Unable to come off zyrtec due to itching.  Continue environmental control measures.  Continue Singulair (montelukast) 10mg  daily at night. Continue with Zyrtec (cetirizine) 10mg  1-2 times a day as needed.  Consider allergy injections for long term control if above medications do not help the symptoms - handout given.   Adverse food reaction Past history - 2018 skin testing positive to soy, peanut and borderline to wheat. She had no prior clinical reactions to these foods. 2021 bloodwork borderline positive to hazelnut, peanut, macademia nut, pistachios and soy. Positive to sesame seed. Tolerates soy and wheat. Interim history - no reactions but concerned if the above symptoms with the facial erythema/throat tightness means she is developing any new food allergies.  Continue to avoid sesame, peanuts and tree nuts. For mild symptoms you can take over the counter antihistamines such as Benadryl and monitor symptoms closely. If symptoms worsen or if you have severe symptoms including breathing issues, throat closure, significant swelling, whole body hives, severe diarrhea and vomiting, lightheadedness then inject epinephrine and seek immediate medical care afterwards. Food action plan in place.  Consider food challenge to peanuts once done with pregnancy.  Dermatographism Continue proper skin care. Zyrtec (cetirizine) 10mg  twice a day.   Return in about 2 months (around 03/01/2022).  No orders of the defined types were placed in this encounter.  Lab Orders         Alpha-Gal  Panel         ANA w/Reflex         C1 Esterase Inhibitor         C1 esterase inhibitor, functional         C3 and C4         CBC with Differential/Platelet         Chronic Urticaria         Comprehensive metabolic panel         C-reactive protein         Thyroid Cascade Profile         Sedimentation rate         Tryptase         IgE Nut Prof. w/Component Rflx         Food Allergy Profile      Diagnostics: Spirometry:  Tracings reviewed. Her effort: Good reproducible efforts. FVC: 2.80L FEV1: 2.54L, 101% predicted FEV1/FVC ratio: 91% Interpretation: Spirometry consistent with normal pattern.  Please see scanned spirometry results for details.  Skin Testing: Deferred due to recent antihistamines use. Unable to come off antihistamines.   Medication List:  Current Outpatient Medications  Medication Sig Dispense Refill   albuterol (VENTOLIN HFA) 108 (90 Base) MCG/ACT inhaler Inhale 2 puffs into the lungs every 4 (four) hours as needed for wheezing or shortness of breath. 18 g 1   cetirizine (ZYRTEC) 10 MG tablet Take 10 mg by mouth daily.     Continuous Blood Gluc Sensor (DEXCOM G6 SENSOR) MISC  1 Device by Does not apply route as directed. 9 each 3   Continuous Blood Gluc Transmit (DEXCOM G6 TRANSMITTER) MISC 1 Device by Does not apply route as directed. 1 each 3   diphenhydrAMINE (BENADRYL) 25 mg capsule Take by mouth.     Docusate Sodium (COLACE PO) Take by mouth.     EPINEPHrine (AUVI-Q) 0.3 mg/0.3 mL IJ SOAJ injection Inject 0.3 mg into the muscle as needed for anaphylaxis. 1 each 2   fluticasone furoate-vilanterol (BREO ELLIPTA) 100-25 MCG/ACT AEPB Inhale 1 puff into the lungs daily. Rinse mouth after each use. 60 each 5   fluticasone furoate-vilanterol (BREO ELLIPTA) 200-25 MCG/INH AEPB Inhale 1 puff into the lungs daily. Rinse mouth after each use. 60 each 3   Glucagon 1 MG/0.2ML SOAJ Inject 1 mg into the skin as directed. 0.2 mL 1   Microlet Lancets MISC      montelukast  (SINGULAIR) 10 MG tablet Take 1 tablet (10 mg total) by mouth daily. 90 tablet 1   NON FORMULARY Continuous glucose monitor     NOVOLOG 100 UNIT/ML injection SMARTSIG:0-40 Unit(s) SUB-Q Daily     Prenatal Vit-Fe Fumarate-FA (PRENATAL MULTIVITAMIN) TABS tablet Take 1 tablet by mouth daily at 12 noon.     Probiotic Product (PROBIOTIC DAILY PO) Take by mouth.     No current facility-administered medications for this visit.   Allergies: Allergies  Allergen Reactions   Molds & Smuts Itching and Shortness Of Breath   Shellfish Allergy Shortness Of Breath and Rash    Crab causes SOB, Scallops cause rash.  Can eat Shrimp and lobster.   Acetaminophen Other (See Comments)    Effects blood sugars and insulin pump.   Cephalexin Nausea Only and Other (See Comments)    Headaches, dizziness   Pneumococcal Vaccines Swelling    Local injection swelling   Prednisone Nausea Only and Other (See Comments)    Headaches, dizziness, Prefers not to take because of uncontrollable BG's on this med;      Soy Allergy Other (See Comments)    Blood test   Adhesive [Tape] Rash    With extended exposure   Dust Mite Extract Rash   Peanut-Containing Drug Products Rash   I reviewed her past medical history, social history, family history, and environmental history and no significant changes have been reported from her previous visit.  Review of Systems  Constitutional:  Negative for appetite change, chills, fever and unexpected weight change.  HENT:  Negative for congestion and rhinorrhea.   Eyes:  Negative for itching.  Respiratory:  Negative for cough, chest tightness, shortness of breath and wheezing.   Cardiovascular:  Negative for chest pain.  Gastrointestinal:  Negative for abdominal pain.  Genitourinary:  Negative for difficulty urinating.  Skin:  Positive for rash.  Allergic/Immunologic: Positive for environmental allergies.  Neurological:  Negative for headaches.   Objective: BP 110/68 (BP  Location: Left Arm, Patient Position: Sitting, Cuff Size: Normal)    Pulse 72    Temp 98.4 F (36.9 C) (Temporal)    Resp 18    Ht 5\' 2"  (1.575 m)    Wt 113 lb 9.6 oz (51.5 kg)    SpO2 96%    BMI 20.78 kg/m  Body mass index is 20.78 kg/m. Physical Exam Vitals and nursing note reviewed.  Constitutional:      Appearance: Normal appearance. She is well-developed.  HENT:     Head: Normocephalic and atraumatic.     Right Ear: Tympanic membrane and  external ear normal.     Left Ear: Tympanic membrane and external ear normal.     Nose: Nose normal.     Mouth/Throat:     Mouth: Mucous membranes are moist.     Pharynx: Oropharynx is clear.  Eyes:     Conjunctiva/sclera: Conjunctivae normal.  Cardiovascular:     Rate and Rhythm: Normal rate and regular rhythm.     Heart sounds: Normal heart sounds. No murmur heard. Pulmonary:     Effort: Pulmonary effort is normal.     Breath sounds: Normal breath sounds. No wheezing, rhonchi or rales.  Musculoskeletal:     Cervical back: Neck supple.  Skin:    General: Skin is warm.     Findings: No rash.  Neurological:     Mental Status: She is alert and oriented to person, place, and time.  Psychiatric:        Mood and Affect: Mood normal.        Behavior: Behavior normal.   Previous notes and tests were reviewed. The plan was reviewed with the patient/family, and all questions/concerned were addressed.  It was my pleasure to see Celsea today and participate in her care. Please feel free to contact me with any questions or concerns.  Sincerely,  Rexene Alberts, DO Allergy & Immunology  [facility] office: [place] office: [phone]

## 2022-01-01 NOTE — Assessment & Plan Note (Signed)
Past history - 2018 skin testing positive to soy, peanut and borderline to wheat. She had no prior clinical reactions to these foods. 2021 bloodwork borderline positive to hazelnut, peanut, macademia nut, pistachios and soy. Positive to sesame seed. Tolerates soy and wheat. Interim history - no reactions but concerned if the above symptoms with the facial erythema/throat tightness means she is developing any new food allergies.   Continue to avoid sesame, peanuts and tree nuts.  For mild symptoms you can take over the counter antihistamines such as Benadryl and monitor symptoms closely. If symptoms worsen or if you have severe symptoms including breathing issues, throat closure, significant swelling, whole body hives, severe diarrhea and vomiting, lightheadedness then inject epinephrine and seek immediate medical care afterwards.  Food action plan in place.   Consider food challenge to peanuts once done with pregnancy.

## 2022-01-01 NOTE — Assessment & Plan Note (Signed)
Past history - Perennial rhino conjunctivitis symptoms for the past 3 years. 2018 skin testing by ENT was positive to grass, ragweed, weed, trees, molds, dust mites, cockroach, horse. SLIT therapy by ENT x few months with no benefit. Deviated septum repair and turbinate reduction in 2019. 2021 bloodwork positive to dust mites, grass pollen, mold, tree pollen, ragweed pollen and weed pollen. Dymista caused nausea.  Interim history - stable. Unable to come off zyrtec due to itching.   Continue environmental control measures.   Continue Singulair (montelukast) 10mg  daily at night.  Continue with Zyrtec (cetirizine) 10mg  1-2 times a day as needed.   Consider allergy injections for long term control if above medications do not help the symptoms - handout given.

## 2022-01-01 NOTE — Patient Instructions (Addendum)
Asthma:  Daily controller medication(s): Breo 1 puff once a day and rinse mouth after each use.  Continue Singulair (montelukast) 10mg  daily at night. During upper respiratory infections/asthma flares:  Start Breo 1 puff once a day for 1-2 weeks until your breathing symptoms return to baseline.  Pretreat with albuterol 2 puffs. May use albuterol rescue inhaler 2 puffs every 4 to 6 hours as needed for shortness of breath, chest tightness, coughing, and wheezing. May use albuterol rescue inhaler 2 puffs 5 to 15 minutes prior to strenuous physical activities. Monitor frequency of use.  Asthma control goals:  Full participation in all desired activities (may need albuterol before activity) Albuterol use two times or less a week on average (not counting use with activity) Cough interfering with sleep two times or less a month Oral steroids no more than once a year No hospitalizations   Environmental allergies: Continue Singulair (montelukast) 10mg  daily at night. Continue with Zyrtec (cetirizine) 10mg  1-2 times a day as needed.  Consider allergy injections for long term control if above medications do not help the symptoms - handout given.   Food allergies: Continue to avoid sesame, peanuts and tree nuts. For mild symptoms you can take over the counter antihistamines such as Benadryl and monitor symptoms closely. If symptoms worsen or if you have severe symptoms including breathing issues, throat closure, significant swelling, whole body hives, severe diarrhea and vomiting, lightheadedness then inject epinephrine and seek immediate medical care afterwards. Food action plan in place.   Rash/swelling Continue proper skin care. Keep track of episodes and symptoms. Write down what you had come across that day.  Increase Zyrtec (cetirizine) 10mg  to twice a day. If you have issues with drowsiness or decreased breastmilk production then let know.  Get bloodwork We are ordering  labs, so please allow 1-2 weeks for the results to come back. With the newly implemented Cures Act, the labs might be visible to you at the same time that they become visible to me. However, I will not address the results until all of the results are back, so please be patient.  In the meantime, continue recommendations in your patient instructions, including avoidance measures (if applicable), until you hear from me.  Follow up in 2 months or sooner if needed.  Skin care recommendations  Bath time: Always use lukewarm water. AVOID very hot or cold water. Keep bathing time to 5-10 minutes. Do NOT use bubble bath. Use a mild soap and use just enough to wash the dirty areas. Do NOT scrub skin vigorously.  After bathing, pat dry your skin with a towel. Do NOT rub or scrub the skin.  Moisturizers and prescriptions:  ALWAYS apply moisturizers immediately after bathing (within 3 minutes). This helps to lock-in moisture. Use the moisturizer several times a day over the whole body. Good summer moisturizers include: Aveeno, CeraVe, Cetaphil. Good winter moisturizers include: Aquaphor, Vaseline, Cerave, Cetaphil, Eucerin, Vanicream. When using moisturizers along with medications, the moisturizer should be applied about one hour after applying the medication to prevent diluting effect of the medication or moisturize around where you applied the medications. When not using medications, the moisturizer can be continued twice daily as maintenance.  Laundry and clothing: Avoid laundry products with added color or perfumes. Use unscented hypo-allergenic laundry products such as Tide free, Cheer free & gentle, and All free and clear.  If the skin still seems dry or sensitive, you can try double-rinsing the clothes. Avoid tight or scratchy clothing such  as wool. Do not use fabric softeners or dyer sheets.

## 2022-01-01 NOTE — Assessment & Plan Note (Addendum)
Past history - Diagnosed with asthma in 2018 by pulmonology via  Methacholine challenge test - data not available for review. Tried Arnuity, Breo with no benefit. No triggers noted. Normal EGD in past per patient report. 2021 spirometry was normal with marginal improvement in FEV1 post bronchodilator treatment. Clinically feeling about the same. Interim history - delivered baby. Doing well with Breo and Singulair.   Normal spirometry today.  Daily controller medication(s): Breo 1 puff once a day and rinse mouth after each use.  o Continue Singulair (montelukast) 10mg  daily at night.  During upper respiratory infections/asthma flares:  o Start Breo 1 puff once a day for 1-2 weeks until your breathing symptoms return to baseline.  o Pretreat with albuterol 2 puffs.  May use albuterol rescue inhaler 2 puffs every 4 to 6 hours as needed for shortness of breath, chest tightness, coughing, and wheezing. May use albuterol rescue inhaler 2 puffs 5 to 15 minutes prior to strenuous physical activities. Monitor frequency of use.   Get spirometry at next visit.

## 2022-01-01 NOTE — Assessment & Plan Note (Signed)
Noted facial erythema and throat discomfort and one episode of lip swelling. Concerned about allergic reactions to something new. Denies any PND, reflux symptoms. These episodes last for a few hours and occur randomly. No triggers noted.   Continue proper skin care.  Keep track of episodes and symptoms.  Write down what you had come across that day.   Increase Zyrtec (cetirizine) 10mg  to twice a day.  If you have issues with drowsiness or decreased breastmilk production then let know.   Get bloodwork as below.

## 2022-01-09 IMAGING — US US MFM FETAL BPP W/O NON-STRESS
1 series · 15 of 28 positions shown · non-contrast
Comparison: none

[Series 1: us mfm fetal bpp w/o non-stress · 37 acquisitions, 15 frames shown]
[im 1/37]
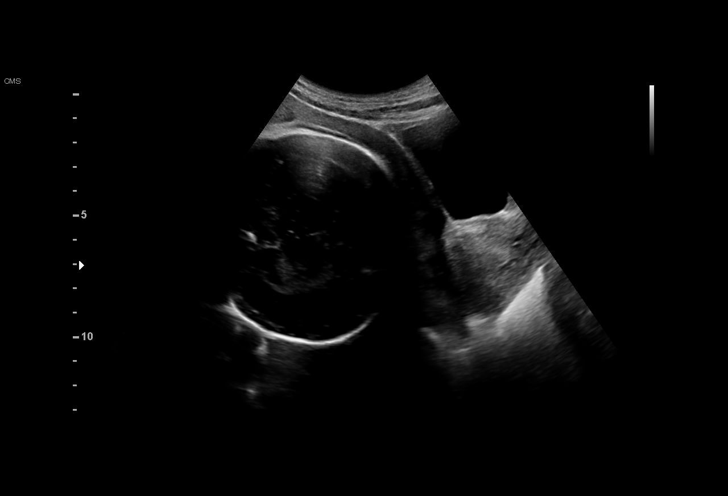
[im 3/37]
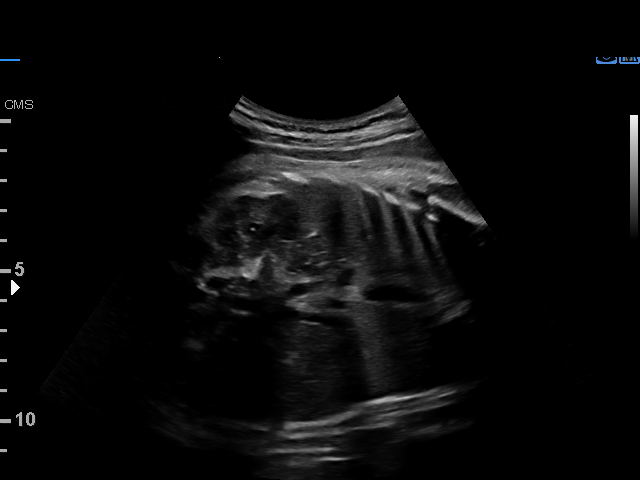
[im 6/37]
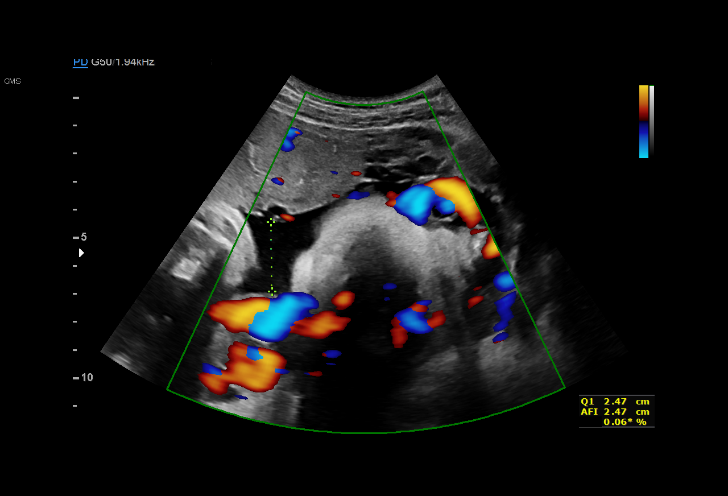
[im 9/37]
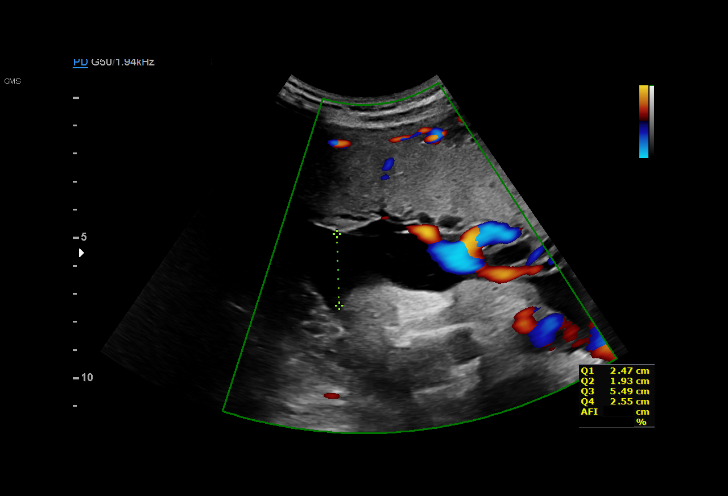
[im 11/37]
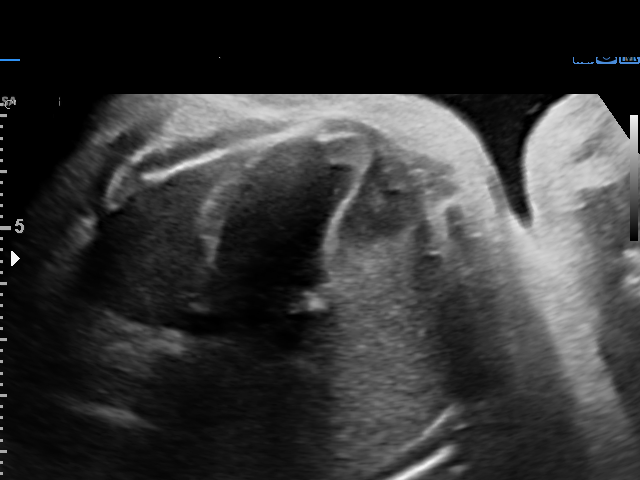
[im 14/37]
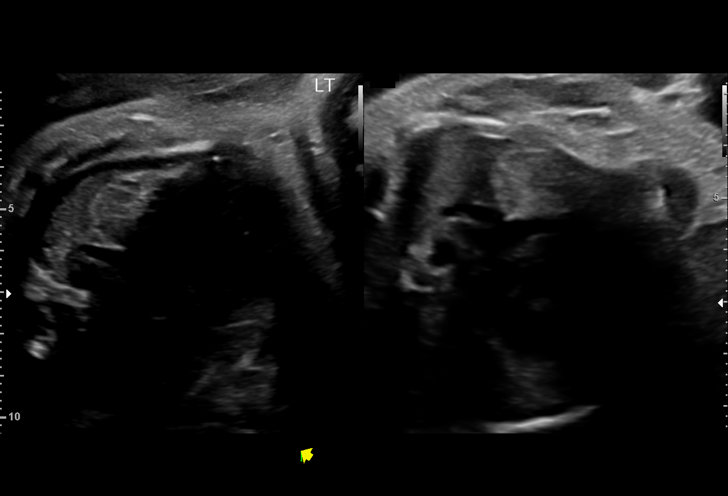
[im 17/37]
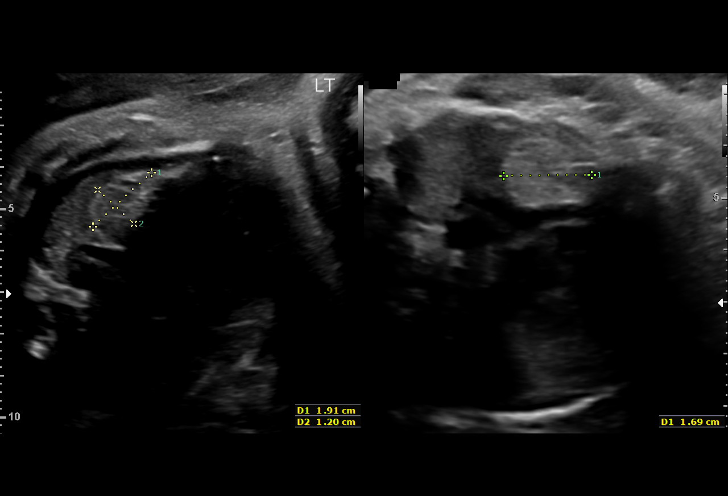
[im 19/37]
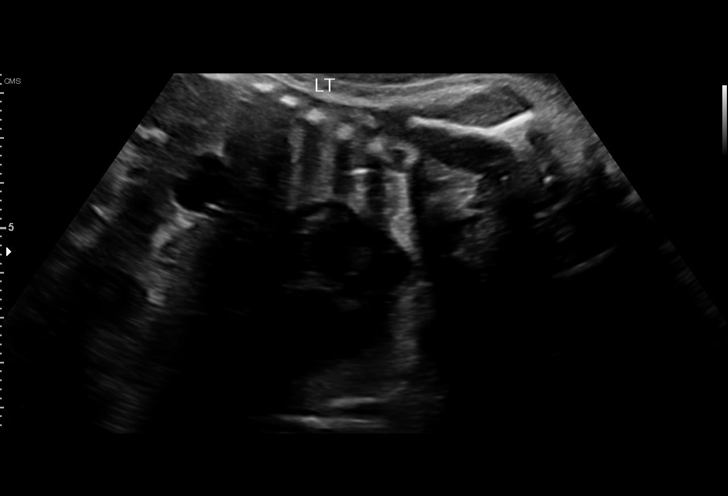
[im 21/37]
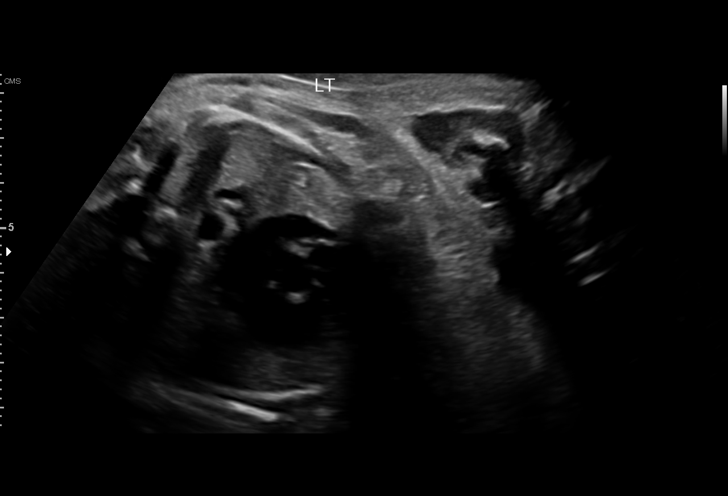
[im 23/37]
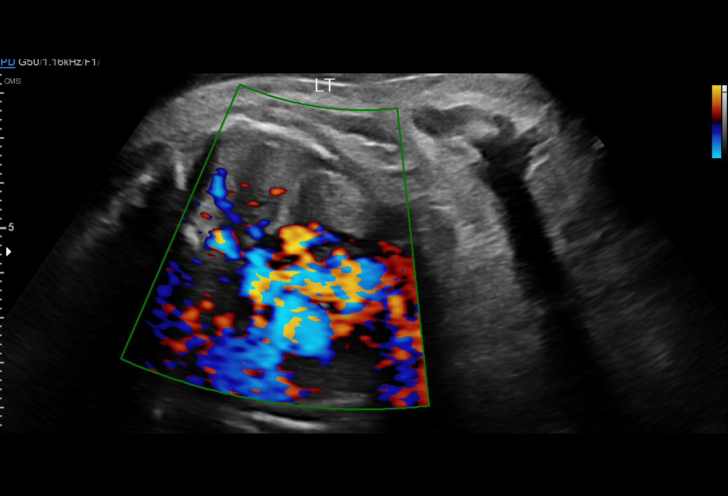
[im 26/37]
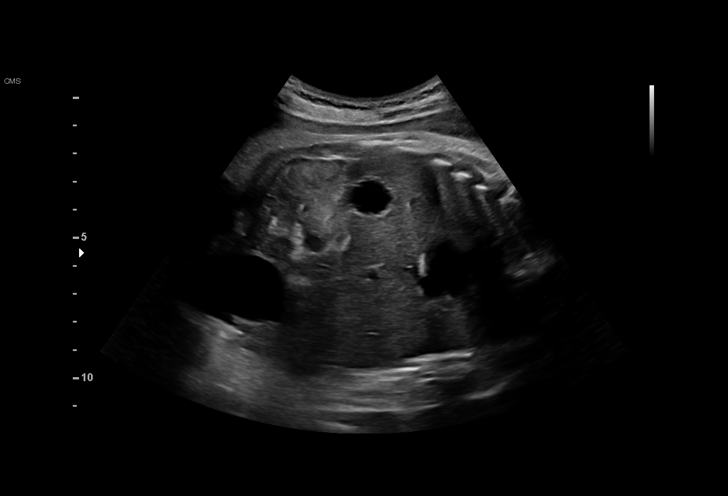
[im 29/37]
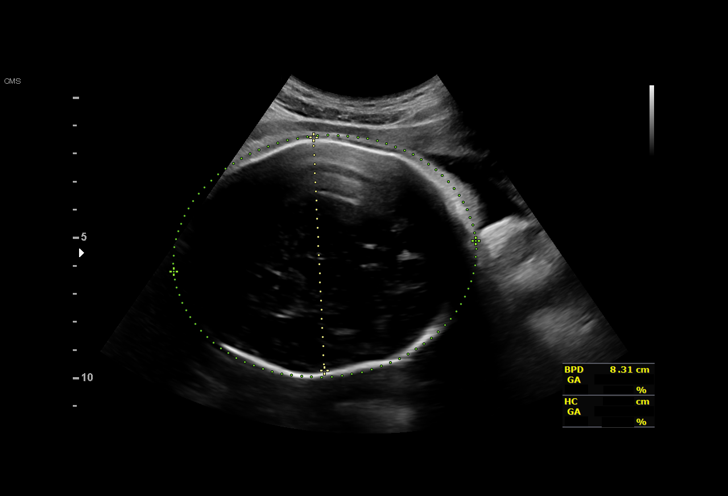
[im 31/37]
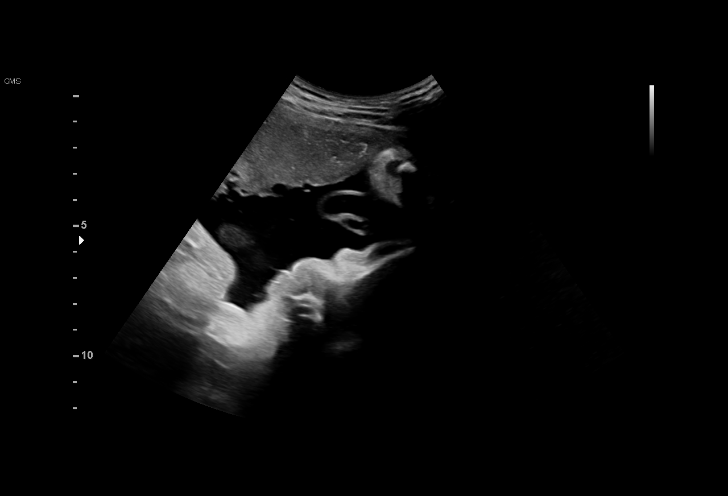
[im 34/37]
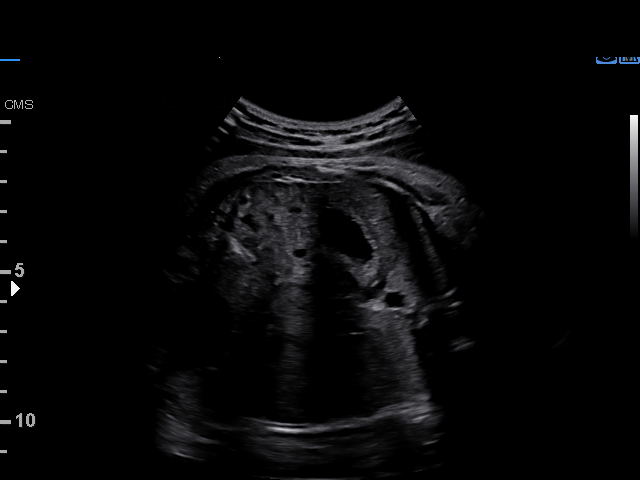
[im 37/37]
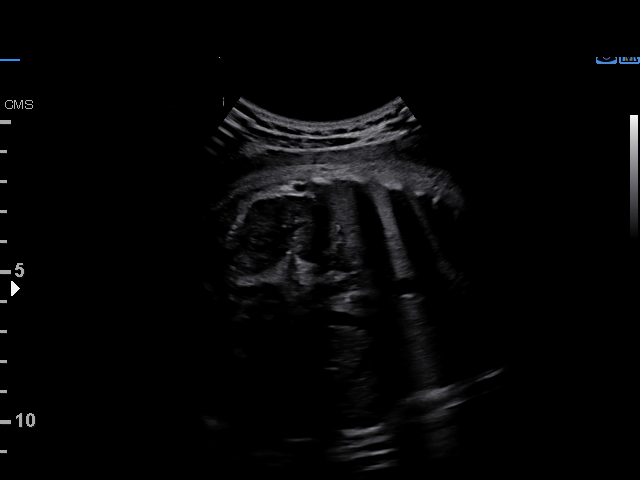

[15 of 28 positions shown; findings below may reference images not displayed]

[address]

 ----------------------------------------------------------------------

 ----------------------------------------------------------------------
Indications

  33 weeks gestation of pregnancy
  Diabetes - Pregestational,3rd trimester -
  insulin pump
  Thyroid disease in pregnancy                   O99.280,
  Encounter for other antenatal screening
  follow-up
  Fetal abnormality - other known or
  suspected (chest mass)
 ----------------------------------------------------------------------
Fetal Evaluation

 Num Of Fetuses:          1
 Fetal Heart Rate(bpm):   140
 Cardiac Activity:        Observed
 Presentation:            Cephalic

 Amniotic Fluid
 AFI FV:      Within normal limits

 AFI Sum(cm)     %Tile       Largest Pocket(cm)
 12.44           37

 RUQ(cm)       RLQ(cm)       LUQ(cm)        LLQ(cm)

Biometry

 BPD:      83.1  mm     G. Age:  33w 3d         38  %    CI:        74.12   %    70 - 86
 HC:      306.5  mm     G. Age:  34w 1d         24  %
OB History

 Gravidity:    1
Gestational Age

 Clinical EDD:  33w 1d                                        EDD:   03/08/20
 U/S Today:     33w 6d                                        EDD:   03/03/20
 Best:          33w 5d     Det. By:  U/S  (12/16/19)          EDD:   03/04/20
Anatomy

 Other:  Chest mass in left chest measures 1.9 x 1.7 x 1.2 cm.
Impression

 Limited exam to evaluate hydrops
 Evaluated for CPAM CVR
 Normal amniotic fluid.
 Type 2 DM with good fetal movement.
Recommendations

 Continue weekly testing.

## 2022-01-10 ENCOUNTER — Encounter: Payer: Self-pay | Admitting: Allergy

## 2022-01-10 DIAGNOSIS — R21 Rash and other nonspecific skin eruption: Secondary | ICD-10-CM

## 2022-01-10 LAB — FOOD ALLERGY PROFILE
Allergen Corn, IgE: 0.19 kU/L — AB
Clam IgE: <0.1 kU/L
Codfish IgE: <0.1 kU/L
Egg White IgE: <0.1 kU/L
Milk IgE: <0.1 kU/L
Scallop IgE: <0.1 kU/L
Sesame Seed IgE: 1.35 kU/L — AB
Shrimp IgE: <0.1 kU/L
Soybean IgE: 0.26 kU/L — AB
Wheat IgE: 0.21 kU/L — AB

## 2022-01-10 LAB — COMPREHENSIVE METABOLIC PANEL
ALT: 16 IU/L (ref 0–32)
AST: 22 IU/L (ref 0–40)
Albumin/Globulin Ratio: 1.5 (ref 1.2–2.2)
Albumin: 4.8 g/dL (ref 3.8–4.8)
Alkaline Phosphatase: 105 IU/L (ref 44–121)
BUN/Creatinine Ratio: 18 (ref 9–23)
BUN: 13 mg/dL (ref 6–20)
Bilirubin Total: 0.4 mg/dL (ref 0.0–1.2)
CO2: 23 mmol/L (ref 20–29)
Calcium: 10.1 mg/dL (ref 8.7–10.2)
Chloride: 104 mmol/L (ref 96–106)
Creatinine, Ser: 0.74 mg/dL (ref 0.57–1.00)
Globulin, Total: 3.3 g/dL (ref 1.5–4.5)
Glucose: 97 mg/dL (ref 70–99)
Potassium: 4 mmol/L (ref 3.5–5.2)
Sodium: 143 mmol/L (ref 134–144)
Total Protein: 8.1 g/dL (ref 6.0–8.5)
eGFR: 108 mL/min/{1.73_m2} (ref >59)

## 2022-01-10 LAB — CBC WITH DIFFERENTIAL/PLATELET
Basophils Absolute: 0 10*3/uL (ref 0.0–0.2)
Basos: 1 %
EOS (ABSOLUTE): 0.1 10*3/uL (ref 0.0–0.4)
Eos: 1 %
Hematocrit: 38.7 % (ref 34.0–46.6)
Hemoglobin: 13 g/dL (ref 11.1–15.9)
Immature Grans (Abs): 0 10*3/uL (ref 0.0–0.1)
Immature Granulocytes: 0 %
Lymphocytes Absolute: 1.5 10*3/uL (ref 0.7–3.1)
Lymphs: 22 %
MCH: 29.6 pg (ref 26.6–33.0)
MCHC: 33.6 g/dL (ref 31.5–35.7)
MCV: 88 fL (ref 79–97)
Monocytes Absolute: 0.4 10*3/uL (ref 0.1–0.9)
Monocytes: 6 %
Neutrophils Absolute: 4.8 10*3/uL (ref 1.4–7.0)
Neutrophils: 70 %
Platelets: 509 10*3/uL — ABNORMAL HIGH (ref 150–450)
RBC: 4.39 x10E6/uL (ref 3.77–5.28)
RDW: 13 % (ref 11.7–15.4)
WBC: 6.9 10*3/uL (ref 3.4–10.8)

## 2022-01-10 LAB — IGE NUT PROF. W/COMPONENT RFLX
F017-IgE Hazelnut (Filbert): <0.1 kU/L
F018-IgE Brazil Nut: <0.1 kU/L
F020-IgE Almond: <0.1 kU/L
F202-IgE Cashew Nut: <0.1 kU/L
F203-IgE Pistachio Nut: 0.23 kU/L — AB
F256-IgE Walnut: <0.1 kU/L
Macadamia Nut, IgE: 0.19 kU/L — AB
Peanut, IgE: 0.27 kU/L — AB
Pecan Nut IgE: <0.1 kU/L

## 2022-01-10 LAB — ANA W/REFLEX: Anti Nuclear Antibody (ANA): NEGATIVE

## 2022-01-10 LAB — SEDIMENTATION RATE: Sed Rate: 57 mm/hr — ABNORMAL HIGH (ref 0–32)

## 2022-01-10 LAB — ALPHA-GAL PANEL
Allergen Lamb IgE: <0.1 kU/L
Beef IgE: <0.1 kU/L
IgE (Immunoglobulin E), Serum: 201 IU/mL (ref 6–495)
O215-IgE Alpha-Gal: <0.1 kU/L
Pork IgE: <0.1 kU/L

## 2022-01-10 LAB — C3 AND C4
Complement C3, Serum: 142 mg/dL (ref 82–167)
Complement C4, Serum: 27 mg/dL (ref 12–38)

## 2022-01-10 LAB — CHRONIC URTICARIA: cu index: <2.8 (ref <10)

## 2022-01-10 LAB — PEANUT COMPONENTS
F352-IgE Ara h 8: <0.1 kU/L
F422-IgE Ara h 1: 0.12 kU/L — AB
F423-IgE Ara h 2: <0.1 kU/L
F424-IgE Ara h 3: <0.1 kU/L
F427-IgE Ara h 9: <0.1 kU/L
F447-IgE Ara h 6: <0.1 kU/L

## 2022-01-10 LAB — C1 ESTERASE INHIBITOR, FUNCTIONAL: C1INH Functional/C1INH Total MFr SerPl: >93 %mean normal

## 2022-01-10 LAB — TRYPTASE: Tryptase: 11.9 ug/L (ref 2.2–13.2)

## 2022-01-10 LAB — THYROID CASCADE PROFILE: TSH: 0.529 u[IU]/mL (ref 0.450–4.500)

## 2022-01-10 LAB — ALLERGEN COMPONENT COMMENTS

## 2022-01-10 LAB — C-REACTIVE PROTEIN: CRP: 2 mg/L (ref 0–10)

## 2022-01-10 LAB — C1 ESTERASE INHIBITOR: C1INH SerPl-mCnc: 33 mg/dL (ref 21–39)

## 2022-01-15 ENCOUNTER — Encounter: Payer: Self-pay | Admitting: Allergy

## 2022-01-24 LAB — SEDIMENTATION RATE: Sed Rate: 10 mm/hr (ref 0–32)

## 2022-01-24 LAB — CBC WITH DIFFERENTIAL/PLATELET
Basophils Absolute: 0.1 10*3/uL (ref 0.0–0.2)
Basos: 1 %
EOS (ABSOLUTE): 0 10*3/uL (ref 0.0–0.4)
Eos: 1 %
Hematocrit: 42.2 % (ref 34.0–46.6)
Hemoglobin: 13.8 g/dL (ref 11.1–15.9)
Immature Grans (Abs): 0 10*3/uL (ref 0.0–0.1)
Immature Granulocytes: 0 %
Lymphocytes Absolute: 1.1 10*3/uL (ref 0.7–3.1)
Lymphs: 24 %
MCH: 28.8 pg (ref 26.6–33.0)
MCHC: 32.7 g/dL (ref 31.5–35.7)
MCV: 88 fL (ref 79–97)
Monocytes Absolute: 0.3 10*3/uL (ref 0.1–0.9)
Monocytes: 7 %
Neutrophils Absolute: 3.1 10*3/uL (ref 1.4–7.0)
Neutrophils: 67 %
Platelets: 431 10*3/uL (ref 150–450)
RBC: 4.79 x10E6/uL (ref 3.77–5.28)
RDW: 13.3 % (ref 11.7–15.4)
WBC: 4.7 10*3/uL (ref 3.4–10.8)

## 2022-02-08 IMAGING — US US MFM FETAL BPP W/O NON-STRESS
1 series · 12 of 28 positions shown · non-contrast
Comparison: none

[Series 1: us mfm fetal bpp w/o non-stress · 29 acquisitions, 12 frames shown]
[im 2/29]
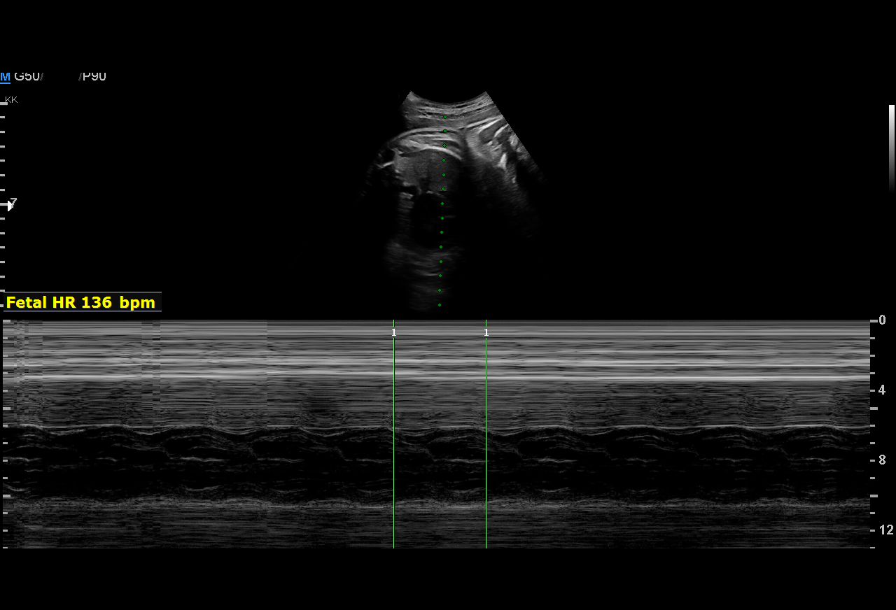
[im 4/29]
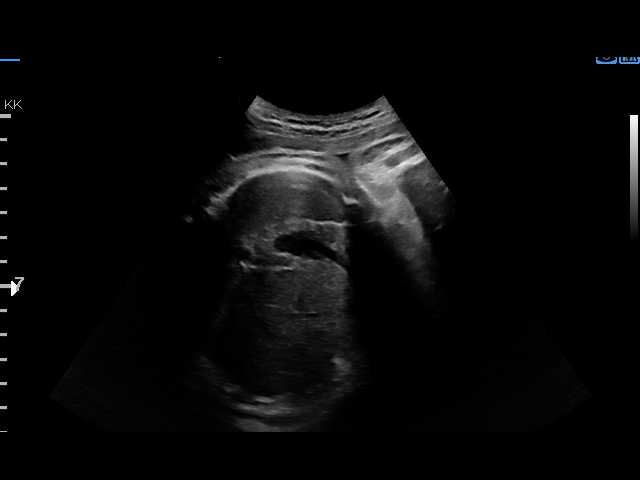
[im 6/29]
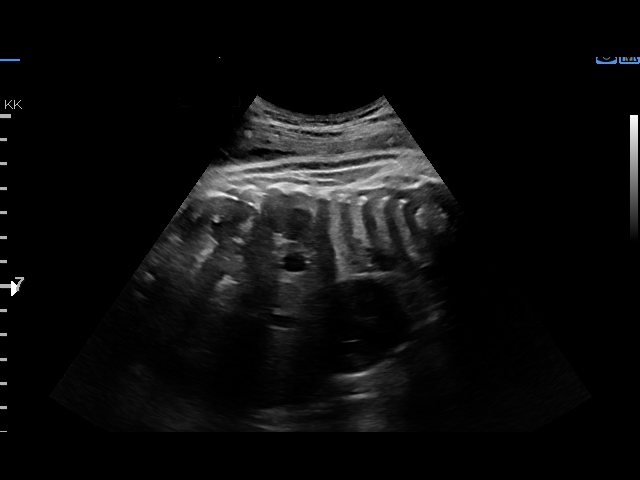
[im 9/29]
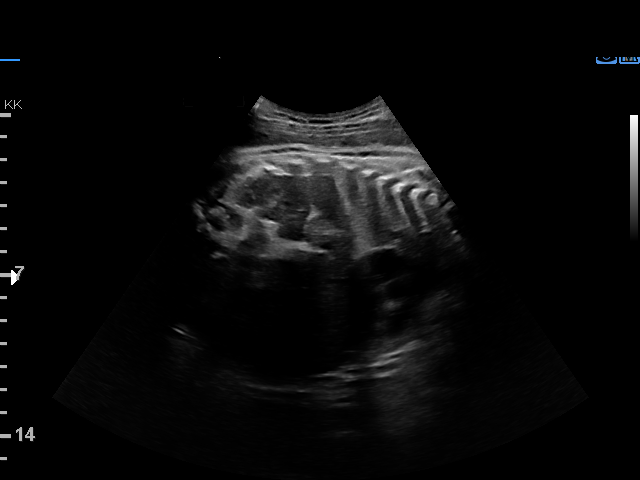
[im 11/29]
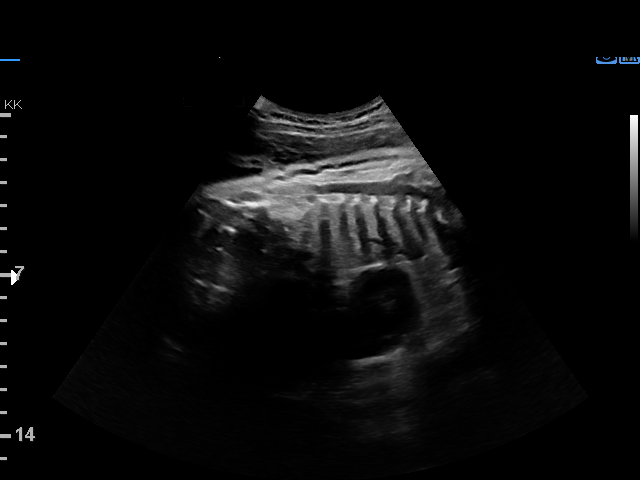
[im 13/29]
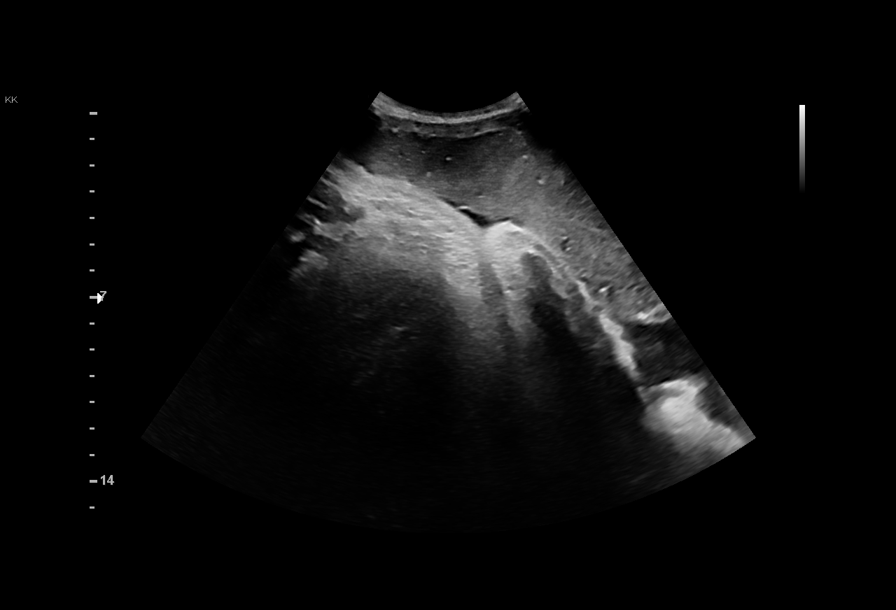
[im 16/29]
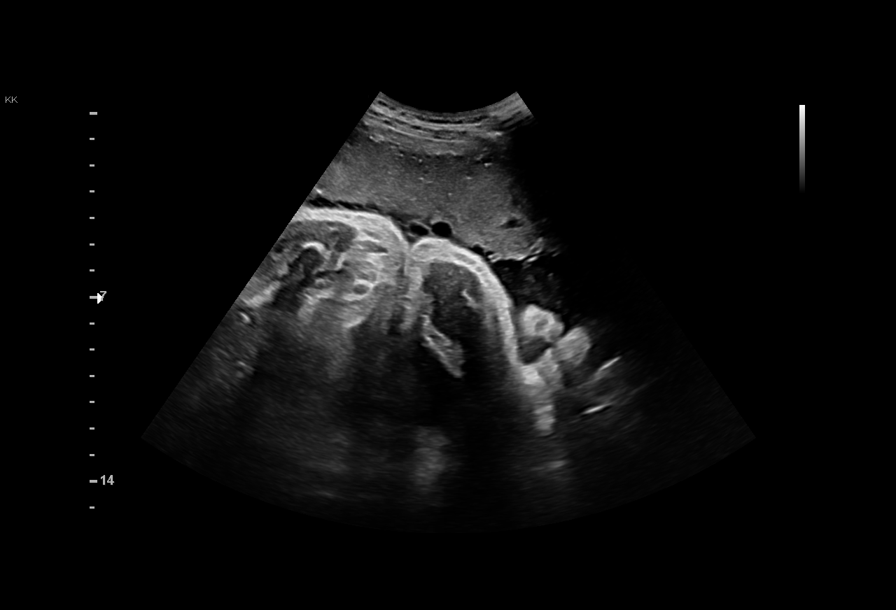
[im 18/29]
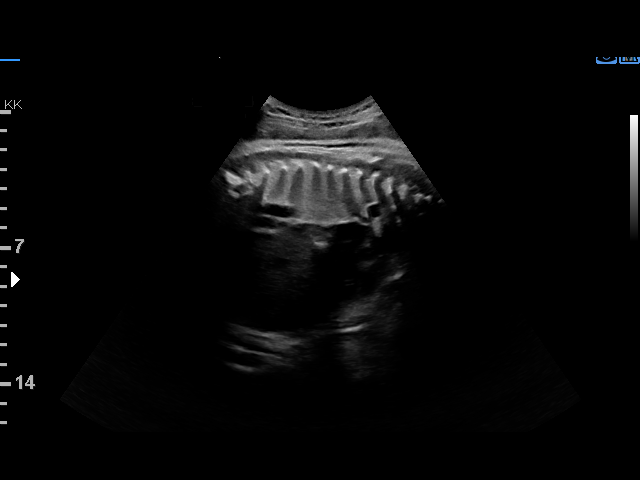
[im 20/29]
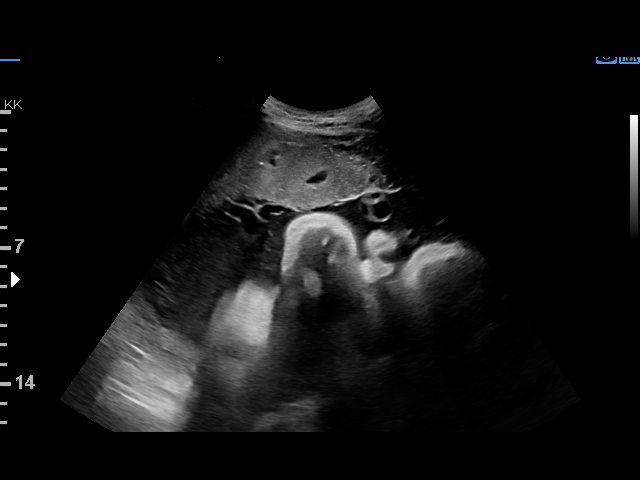
[im 23/29]
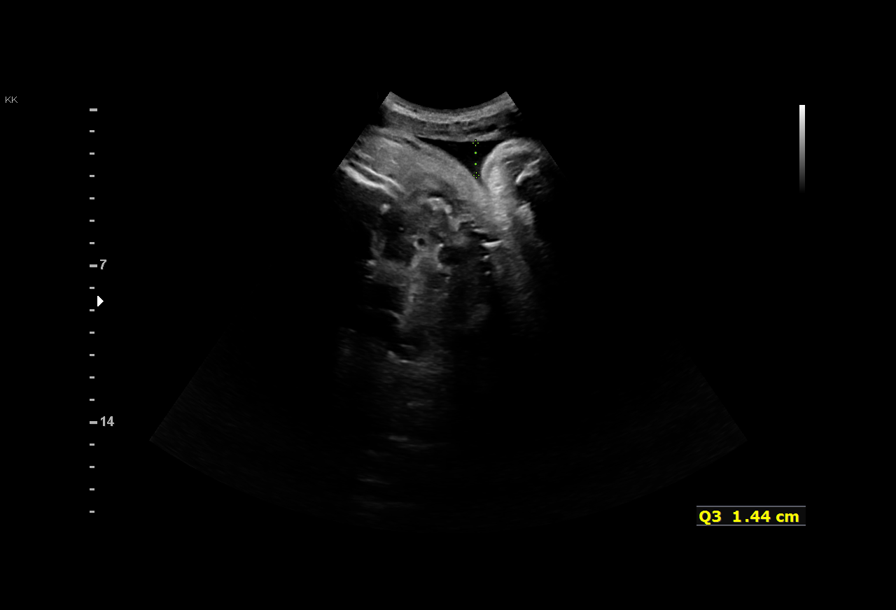
[im 25/29]
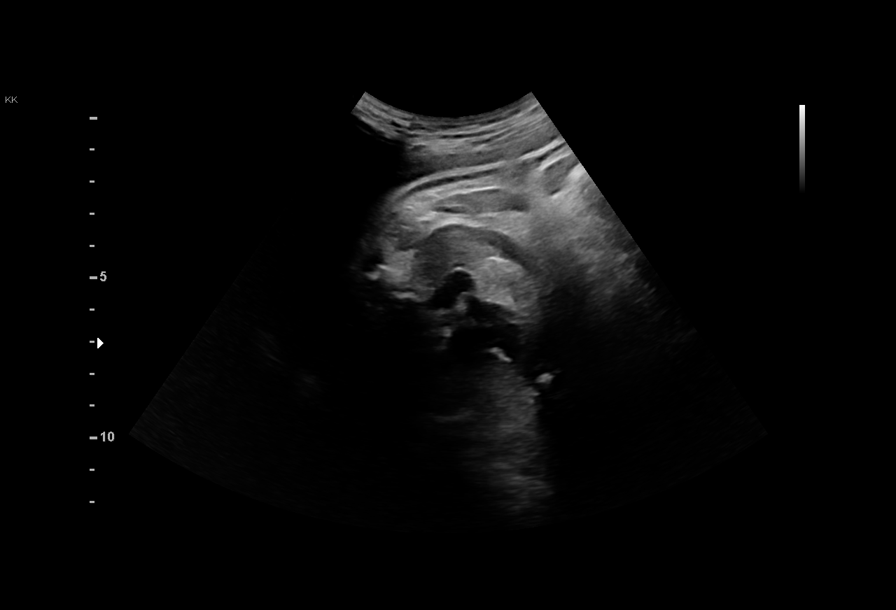
[im 27/29]
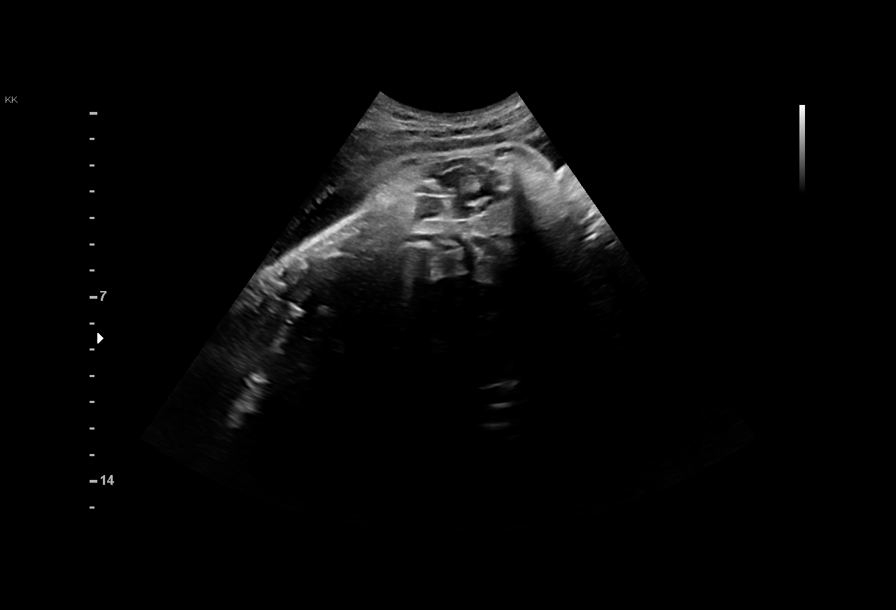

[12 of 28 positions shown; findings below may reference images not displayed]

[address]

 ----------------------------------------------------------------------

 ----------------------------------------------------------------------
Indications

  Diabetes - Pregestational,3rd trimester -
  insulin pump
  Fetal abnormality - other known or
  suspected (chest mass)
  Thyroid disease in pregnancy                   O99.280,
  Transferred care - moved to [facility]
  38 weeks gestation of pregnancy
 ----------------------------------------------------------------------
Fetal Evaluation

 Num Of Fetuses:         1
 Fetal Heart Rate(bpm):  136
 Cardiac Activity:       Observed
 Presentation:           Cephalic
 Placenta:               Anterior
 P. Cord Insertion:      Previously Visualized

 Amniotic Fluid
 AFI FV:      Within normal limits

 AFI Sum(cm)     %Tile       Largest Pocket(cm)
 12.31           43

 RUQ(cm)       RLQ(cm)       LUQ(cm)        LLQ(cm)

Biophysical Evaluation

 Amniotic F.V:   Pocket => 2 cm             F. Tone:        Observed
 F. Movement:    Observed                   Score:          [DATE]
 F. Breathing:   Observed
OB History

 Gravidity:    1
Gestational Age

 Clinical EDD:  37w 3d                                        EDD:   03/08/20
 Best:          38w 0d     Det. By:  U/S  (12/16/19)          EDD:   03/04/20
Comments

 This patient was seen for a biophysical profile due to
 pregestational diabetes.  The patient's blood pressures have
 been noted to be elevated over the past few days.  Her blood
 pressure today was 141/86.  She denies any signs or
 symptoms of preeclampsia.  The patient reports that she was
 evaluated in the [facility] two days ago during which time
 preeclampsia was ruled out.
 A biophysical profile performed today was [DATE].
 There was normal amniotic fluid noted on today's ultrasound
 exam.
 Preeclampsia precautions were reviewed today.  She
 understands to go to the hospital should she experience any
 signs or symptoms of severe preeclampsia.
 She is already scheduled for induction in 4 days.

## 2022-02-26 ENCOUNTER — Ambulatory Visit: Payer: BC Managed Care – PPO | Admitting: Cardiology

## 2022-02-26 NOTE — Progress Notes (Deleted)
? ?Follow Up Note ? ?RE: Nicole Carrillo MRN: [id number] DOB: 12/24/1985 ?Date of Office Visit: 02/27/2022 ? ?Referring provider: Ma Hillock, DO ?Primary care provider: Ma Hillock, DO ? ?Chief Complaint: No chief complaint on file. ? ?History of Present Illness: ?I had the pleasure of seeing Nicole Carrillo for a follow up visit at the [facility] on 02/26/2022. She is a 36 y.o. female, who is being followed for asthma, possible allergic reaction, allergic rhinoconjunctivitis, adverse food reaction and dermatographism. Her previous allergy office visit was on 01/01/2022 with Dr. Maudie Carrillo. Today is a regular follow up visit. ? ?Asthma ?Past history - Diagnosed with asthma in 2018 by pulmonology via  Methacholine challenge test - data not available for review. Tried Arnuity, Breo with no benefit. No triggers noted. Normal EGD in past per patient report. 2021 spirometry was normal with marginal improvement in FEV1 post bronchodilator treatment. Clinically feeling about the same. ?Interim history - delivered baby. Doing well with Breo 137mcg and Singulair.  ?Normal spirometry today. ?Daily controller medication(s): Breo 161mcg 1 puff once a day and rinse mouth after each use.  ?Continue Singulair (montelukast) 10mg  daily at night. ?During upper respiratory infections/asthma flares:  ?Start Breo 222mcg 1 puff once a day for 1-2 weeks until your breathing symptoms return to baseline.  ?Pretreat with albuterol 2 puffs. ?May use albuterol rescue inhaler 2 puffs every 4 to 6 hours as needed for shortness of breath, chest tightness, coughing, and wheezing. May use albuterol rescue inhaler 2 puffs 5 to 15 minutes prior to strenuous physical activities. Monitor frequency of use.  ?Get spirometry at next visit. ?  ?Allergic reaction? ?Noted facial erythema and throat discomfort and one episode of lip swelling. Concerned about allergic reactions to something new. Denies any PND, reflux symptoms. These  episodes last for a few hours and occur randomly. No triggers noted.  ?Continue proper skin care. ?Keep track of episodes and symptoms. ?Write down what you had come across that day.  ?Increase Zyrtec (cetirizine) 10mg  to twice a day. ?If you have issues with drowsiness or decreased breastmilk production then let us know.  ?Get bloodwork as below.  ?  ?Seasonal and perennial allergic rhinoconjunctivitis ?Past history - Perennial rhino conjunctivitis symptoms for the past 3 years. 2018 skin testing by ENT was positive to grass, ragweed, weed, trees, molds, dust mites, cockroach, horse. SLIT therapy by ENT x few months with no benefit. Deviated septum repair and turbinate reduction in 2019. 2021 bloodwork positive to dust mites, grass pollen, mold, tree pollen, ragweed pollen and weed pollen. Dymista caused nausea.  ?Interim history - stable. Unable to come off zyrtec due to itching.  ?Continue environmental control measures.  ?Continue Singulair (montelukast) 10mg  daily at night. ?Continue with Zyrtec (cetirizine) 10mg  1-2 times a day as needed.  ?Consider allergy injections for long term control if above medications do not help the symptoms - handout given.  ?  ?Adverse food reaction ?Past history - 2018 skin testing positive to soy, peanut and borderline to wheat. She had no prior clinical reactions to these foods. 2021 bloodwork borderline positive to hazelnut, peanut, macademia nut, pistachios and soy. Positive to sesame seed. Tolerates soy and wheat. ?Interim history - no reactions but concerned if the above symptoms with the facial erythema/throat tightness means she is developing any new food allergies.  ?Continue to avoid sesame, peanuts and tree nuts. ?For mild symptoms you can take over the counter antihistamines such as Benadryl and monitor symptoms  closely. If symptoms worsen or if you have severe symptoms including breathing issues, throat closure, significant swelling, whole body hives, severe diarrhea  and vomiting, lightheadedness then inject epinephrine and seek immediate medical care afterwards. ?Food action plan in place.  ?Consider food challenge to peanuts once done with pregnancy. ?  ?Dermatographism ?Continue proper skin care. ?Zyrtec (cetirizine) 10mg  twice a day.  ?  ?Return in about 2 months (around 03/01/2022). ? ?I reviewed the bloodwork. ?Blood count, kidney function, liver function, electrolytes, thyroid, autoimmune screener, chronic urticaria index (checks for autoantibodies that trigger mast cells), tryptase (checks for mast cell issues), angioedema panel (checks for swelling issues), alpha gal (checks for red meat allergy) were all normal which is great.  ?  ?Platelet counts were slightly elevated - will recheck this. ?Sed rate was elevated which is a nonspecific inflammation marker - will recheck this as well.  ?Maybe elevated due to your recent C-section but hard to say.  ?Will mail labs to recheck in 2 weeks. We just need to trend them to make sure they are going down. Given that the rest of the labs were normal. Not too worrisome.  ?  ?Peanut, macadamia nut, pistachio, soy, wheat, corn was borderline positive.  ?Sesame was positive but lower than before.  ?  ?Based on these results, not sure what's causing your current rash/swelling. Monitor symptoms after you eat soy, wheat and corn. ?Continue with zyrtec 10mg  twice a day.  ?  ?Regarding the foods avoid peanuts, tree nuts and sesame. ?Once feeling better, we can schedule for in office food challenges to peanut and tree nuts. ? ?Your blood count showed normal platelets.  Sed rate is normal as well. ? ?Assessment and Plan: ?Nicole Carrillo is a 36 y.o. female with: ?No problem-specific Assessment & Plan notes found for this encounter. ? ?No follow-ups on file. ? ?No orders of the defined types were placed in this encounter. ? ?Lab Orders  ?No laboratory test(s) ordered today  ? ? ?Diagnostics: ?Spirometry:  ?Tracings reviewed. Her effort: {Blank  single:19197::"Good reproducible efforts.","It was hard to get consistent efforts and there is a question as to whether this reflects a maximal maneuver.","Poor effort, data can not be interpreted."} ?FVC: ***L ?FEV1: ***L, ***% predicted ?FEV1/FVC ratio: ***% ?Interpretation: {Blank single:19197::"Spirometry consistent with mild obstructive disease","Spirometry consistent with moderate obstructive disease","Spirometry consistent with severe obstructive disease","Spirometry consistent with possible restrictive disease","Spirometry consistent with mixed obstructive and restrictive disease","Spirometry uninterpretable due to technique","Spirometry consistent with normal pattern","No overt abnormalities noted given today's efforts"}.  ?Please see scanned spirometry results for details. ? ?Skin Testing: {Blank single:19197::"Select foods","Environmental allergy panel","Environmental allergy panel and select foods","Food allergy panel","None","Deferred due to recent antihistamines use"}. ?*** ?Results discussed with patient/family. ? ? ?Medication List:  ?Current Outpatient Medications  ?Medication Sig Dispense Refill  ? albuterol (VENTOLIN HFA) 108 (90 Base) MCG/ACT inhaler Inhale 2 puffs into the lungs every 4 (four) hours as needed for wheezing or shortness of breath. 18 g 1  ? cetirizine (ZYRTEC) 10 MG tablet Take 10 mg by mouth daily.    ? Continuous Blood Gluc Sensor (DEXCOM G6 SENSOR) MISC 1 Device by Does not apply route as directed. 9 each 3  ? Continuous Blood Gluc Transmit (DEXCOM G6 TRANSMITTER) MISC 1 Device by Does not apply route as directed. 1 each 3  ? diphenhydrAMINE (BENADRYL) 25 mg capsule Take by mouth.    ? Docusate Sodium (COLACE PO) Take by mouth.    ? EPINEPHrine (AUVI-Q) 0.3 mg/0.3 mL IJ SOAJ injection Inject  0.3 mg into the muscle as needed for anaphylaxis. 1 each 2  ? fluticasone furoate-vilanterol (BREO ELLIPTA) 100-25 MCG/ACT AEPB Inhale 1 puff into the lungs daily. Rinse mouth after each  use. 60 each 5  ? fluticasone furoate-vilanterol (BREO ELLIPTA) 200-25 MCG/INH AEPB Inhale 1 puff into the lungs daily. Rinse mouth after each use. 60 each 3  ? Glucagon 1 MG/0.2ML SOAJ Inject 1 mg into the skin as d

## 2022-02-27 ENCOUNTER — Ambulatory Visit: Payer: BC Managed Care – PPO | Admitting: Allergy

## 2022-03-08 ENCOUNTER — Other Ambulatory Visit: Payer: Self-pay | Admitting: Allergy

## 2022-03-09 ENCOUNTER — Ambulatory Visit: Payer: BC Managed Care – PPO | Admitting: Cardiology

## 2022-03-09 ENCOUNTER — Encounter: Payer: Self-pay | Admitting: Cardiology

## 2022-03-09 VITALS — BP 124/78 | HR 74 | Ht 62.0 in | Wt 107.8 lb

## 2022-03-09 DIAGNOSIS — E1065 Type 1 diabetes mellitus with hyperglycemia: Secondary | ICD-10-CM

## 2022-03-09 DIAGNOSIS — O165 Unspecified maternal hypertension, complicating the puerperium: Secondary | ICD-10-CM

## 2022-03-09 NOTE — Progress Notes (Signed)
?[facility] ? ?Follow Up Note ? ? ?Date:  03/09/2022  ? ?ID:  Nicole Carrillo, DOB 03/23/1986, MRN [id number] ? ?PCP:  Nicole Carrillo ?  ?[facility] Providers ?Cardiologist:  Nicole Carrillo  ?Electrophysiologist:  None      ? ? ?Referring MD: Nicole Carrillo, Nicole A, Carrillo  ? ?Chief Complaint: ": I am doing fine" ? ?History of Present Illness:   ? ?Nicole Carrillo is a 36 y.o. female [G2P1001] who returns for follow up postpartum visit. ? ?Her medical history includes  asthma, preeclampsia, postpartum hypertension, hyperlipidemia, Hashimoto's thyroiditis and type 1 diabetes diagnosed at 36 years old. ? ?I saw the patient on December 15, 2021 for her immediate postpartum visit.  She delivered her baby successfully.  She is doing well.  Since I saw her she has not had any issues with increasing blood pressure.  She offers no complaints at this time. ? ? ?Prior CV Studies Reviewed: ?The following studies were reviewed today: ?Stress echo IMPRESSIONS  ? ? 1. This is a negative stress echocardiogram for ischemia.  ? 2. Excellent exercise capacity (11:47 min:s; 13.4 METS). Normal BP/HR  ?response to exercise.  ? 3. This is a low risk study.  ? ?FINDINGS  ? ?Exam Protocol: The patient exercised on a treadmill according to a Bruce  ?protocol.  ?   ?Patient Performance: The patient exercised for 11 minutes and 47 seconds,  ?achieving 13.4 METS. The maximum stage achieved was V of the Bruce  ?protocol. The baseline heart rate was 103 bpm. The heart rate at peak  ?stress was 187 bpm. The target heart rate  ?was calculated to be 158 bpm. The percentage of maximum predicted heart  ?rate achieved was 100.8 %. The baseline blood pressure was 152/90 mmHg.  ?The blood pressure at peak stress was 211/74 mmHg. The blood pressure  ?response was normal. The patient  ?developed fatigue during the stress exam. The patient's functional  ?capacity was excellent.  ?   ?EKG: Resting EKG showed normal sinus rhythm with no abnormal  findings. The  ?patient developed no abnormal EKG findings during exercise.  ?  ?  ?Zio monitor ?The patient wore the monitor for 6 days 22 hours starting September 12, 2020. ?Indication: Palpitations. ?  ?The minimum heart rate was 53 bpm, maximum heart rate was 162  bpm, and average heart rate was 85 bpm. ?Predominant underlying rhythm was Sinus Rhythm. ?  ?  ?Premature atrial complexes were rare less than 1%. ?Premature Ventricular complexes rare less than 1%. ?  ?No ventricular tachycardia, no pauses, No AV block, no supraventricular tachycardia and no atrial fibrillation present. ?4 patient triggered events and 2 diary events noted: 1 associated with premature atrial complex, the remaining associated with sinus rhythm and sinus tachycardia. ?  ?Conclusion: Unremarkable study with no evidence of significant arrhythmia. ? ?Past Medical History:  ?Diagnosis Date  ? Adverse food reaction 04/28/2020  ? Allergy   ? Angio-edema   ? Anxiety   ? Asthma   ? Chicken pox   ? Constipation   ? Diabetes mellitus type 1 (HCC)   ? Dysphagia   ? Dysthymia   ? Eczema   ? Elevated platelet count   ? Fetal CPAM   ? Folliculitis   ? GERD (gastroesophageal reflux disease)   ? Hashimoto's thyroiditis   ? History of migraine   ? Hyperlipidemia   ? Insulin pump in place   ? Left breast mass 01/14/2017  ?  fibroadenoma- benign  ? OCD (obsessive compulsive disorder)   ? Orthodontics   ? permanent upper and lower retainers  ? Preeclampsia 02/24/2020  ? PVD (posterior vitreous detachment), right   ? Rectal bleeding   ? Rectal fissure   ? Tinnitus   ? Urticaria   ? Wears contact lenses   ? ? ?Past Surgical History:  ?Procedure Laterality Date  ? CESAREAN SECTION  11/26/2021  ? DENTAL SURGERY    ? ENDOSCOPIC CONCHA BULLOSA RESECTION Bilateral 07/24/2018  ? Procedure: ENDOSCOPIC CONCHA BULLOSA MIDDLE TURBINATE;  Surgeon: Vernie Murders, MD;  Location: [facility];  Service: ENT;  Laterality: Bilateral;  Diabetic - insulin pump  ? SEPTOPLASTY  N/A 07/24/2018  ? Procedure: SEPTOPLASTY;  Surgeon: Vernie Murders, MD;  Location: [facility];  Service: ENT;  Laterality: N/A;  NEEDS TO BE FIRST INSULIN PUMP  ? TURBINATE REDUCTION Bilateral 07/24/2018  ? Procedure: INFERIOR TURBINATE REDUCTION;  Surgeon: Vernie Murders, MD;  Location: [facility];  Service: ENT;  Laterality: Bilateral;  ? WISDOM TOOTH EXTRACTION    ?   ? ? ? ?Current Medications: ?Current Meds  ?Medication Sig  ? albuterol (VENTOLIN HFA) 108 (90 Base) MCG/ACT inhaler Inhale 2 puffs into the lungs every 4 (four) hours as needed for wheezing or shortness of breath.  ? cetirizine (ZYRTEC) 10 MG tablet Take 10 mg by mouth daily.  ? Docusate Sodium (COLACE PO) Take by mouth.  ? EPINEPHrine (AUVI-Q) 0.3 mg/0.3 mL IJ SOAJ injection Inject 0.3 mg into the muscle as needed for anaphylaxis.  ? fluticasone furoate-vilanterol (BREO ELLIPTA) 100-25 MCG/ACT AEPB Inhale 1 puff into the lungs daily. Rinse mouth after each use.  ? Glucagon 1 MG/0.2ML SOAJ Inject 1 mg into the skin as directed.  ? Microlet Lancets MISC   ? montelukast (SINGULAIR) 10 MG tablet TAKE 1 TABLET BY MOUTH EVERY DAY  ? NON FORMULARY Continuous glucose monitor  ? NOVOLOG 100 UNIT/ML injection SMARTSIG:0-40 Unit(s) SUB-Q Daily  ? Prenatal Vit-Fe Fumarate-FA (PRENATAL MULTIVITAMIN) TABS tablet Take 1 tablet by mouth daily at 12 noon.  ? Probiotic Product (PROBIOTIC DAILY PO) Take by mouth.  ?  ? ?Allergies:   Molds & smuts, Shellfish allergy, Acetaminophen, Cephalexin, Pneumococcal vaccines, Prednisone, Soy allergy, Adhesive [tape], Dust mite extract, and Peanut-containing drug products  ? ?Social History  ? ?Socioeconomic History  ? Marital status: Married  ?  Spouse name: Not on file  ? Number of children: Not on file  ? Years of education: Not on file  ? Highest education level: Not on file  ?Occupational History  ? Not on file  ?Tobacco Use  ? Smoking status: Never  ? Smokeless tobacco: Never  ?Vaping Use  ? Vaping Use:  Never used  ?Substance and Sexual Activity  ? Alcohol use: Not Currently  ?  Alcohol/week: 1.0 standard drink  ?  Types: 1 Glasses of wine per week  ? Drug use: Not Currently  ? Sexual activity: Yes  ?  Partners: Male  ?  Birth control/protection: None  ?Other Topics Concern  ? Not on file  ?Social History Narrative  ? Marital status/children/pets: Married. Soon to have 1 son.   ? Education/employment: MSN, Nurse.   ? Safety:   ?   -smoke alarm in the home:Yes  ?   - wears seatbelt: Yes  ?   - Feels safe in their relationships: Yes  ? ?Social Determinants of Health  ? ?Financial Resource Strain: Not on file  ?Food Insecurity:  Unknown  ? Worried About Programme researcher, broadcasting/film/video in the Last Year: Never true  ? Ran Out of Food in the Last Year: Not on file  ?Transportation Needs: Not on file  ?Physical Activity: Not on file  ?Stress: Not on file  ?Social Connections: Not on file  ?  ? ? ?Family History  ?Problem Relation Age of Onset  ? Arthritis Mother   ? Stroke Mother   ? Miscarriages / [place] Mother   ? Arthritis Father   ? Dementia Maternal Grandmother   ? Emphysema Maternal Grandfather   ?   ? ?ROS:   ?Please see the history of present illness.    ? ?All other systems reviewed and are negative. ? ? ?Labs/EKG Reviewed:   ? ?EKG:   ?EKG is was ordered today.   ? ?Recent Labs: ?01/01/2022: ALT 16; BUN 13; Creatinine, Ser 0.74; Potassium 4.0; Sodium 143; TSH 0.529 ?01/23/2022: Hemoglobin 13.8; Platelets 431  ? ?Recent Lipid Panel ?Lab Results  ?Component Value Date/Time  ? CHOL 160 05/16/2021 01:32 PM  ? TRIG 109.0 05/16/2021 01:32 PM  ? HDL 108.20 05/16/2021 01:32 PM  ? CHOLHDL 1 05/16/2021 01:32 PM  ? LDLCALC 30 05/16/2021 01:32 PM  ? ? ?Physical Exam:   ? ?VS:  BP 124/78   Pulse 74   Ht 5\' 2"  (1.575 m)   Wt 107 lb 12.8 oz (48.9 kg)   SpO2 97%   Breastfeeding Yes   BMI 19.72 kg/m?    ? ?Wt Readings from Last 3 Encounters:  ?03/09/22 107 lb 12.8 oz (48.9 kg)  ?01/01/22 113 lb 9.6 oz (51.5 kg)  ?12/15/21 113 lb  (51.3 kg)  ?  ? ?GEN:  Well nourished, well developed in no acute distress ?HEENT: Normal ?NECK: No JVD; No carotid bruits ?LYMPHATICS: No lymphadenopathy ?CARDIAC: RRR, no murmurs, rubs, gallops ?RESPIRATORY:

## 2022-03-09 NOTE — Patient Instructions (Signed)
Medication Instructions:  ?Your physician recommends that you continue on your current medications as directed. Please refer to the Current Medication list given to you today. ? ?*If you need a refill on your cardiac medications before your next appointment, please call your pharmacy* ? ?Follow-Up: ?At [facility], you and your health needs are our priority.  As part of our continuing mission to provide you with exceptional heart care, we have created designated Provider Care Teams.  These Care Teams include your primary Cardiologist (physician) and Advanced Practice Providers (APPs -  Physician Assistants and Nurse Practitioners) who all work together to provide you with the care you need, when you need it. ? ?We recommend signing up for the patient portal called "MyChart".  Sign up information is provided on this After Visit Summary.  MyChart is used to connect with patients for Virtual Visits (Telemedicine).  Patients are able to view lab/test results, encounter notes, upcoming appointments, etc.  Non-urgent messages can be sent to your provider as well.   ?To learn more about what you can do with MyChart, go to [email or web address].   ? ?Your next appointment:   ?6 month(s) ? ?The format for your next appointment:   ?Virtual Visit  ? ?Provider:   ?Thomasene Ripple, DO { ? ? ?

## 2022-04-02 ENCOUNTER — Ambulatory Visit: Payer: BC Managed Care – PPO | Admitting: Family Medicine

## 2022-04-02 ENCOUNTER — Encounter: Payer: Self-pay | Admitting: Family Medicine

## 2022-04-02 VITALS — BP 124/73 | HR 72 | Temp 97.8°F | Ht 62.0 in | Wt 109.0 lb

## 2022-04-02 DIAGNOSIS — N3941 Urge incontinence: Secondary | ICD-10-CM | POA: Diagnosis not present

## 2022-04-02 DIAGNOSIS — R350 Frequency of micturition: Secondary | ICD-10-CM | POA: Diagnosis not present

## 2022-04-02 DIAGNOSIS — R81 Glycosuria: Secondary | ICD-10-CM | POA: Insufficient documentation

## 2022-04-02 LAB — POCT URINALYSIS DIPSTICK
Bilirubin, UA: NEGATIVE
Blood, UA: NEGATIVE
Glucose, UA: POSITIVE — AB
Ketones, UA: NEGATIVE
Leukocytes, UA: NEGATIVE
Nitrite, UA: NEGATIVE
Protein, UA: POSITIVE — AB
Spec Grav, UA: 1.02 (ref 1.010–1.025)
Urobilinogen, UA: 0.2 E.U./dL
pH, UA: 6 (ref 5.0–8.0)

## 2022-04-02 NOTE — Patient Instructions (Signed)
Urine Glucose Test ?Why am I having this test? ?You may have a urine glucose test: ?As part of a routine urine test. The urine glucose test is a part of standard urine testing. ?To check if you have diabetes. ?To check for kidney problems. ?To check for a urinary tract infection (UTI). ?What is being tested? ?The urine glucose test measures how much sugar (glucose) is in your urine. ?Normally, the kidneys filter glucose out of the blood and then the glucose is reabsorbed into the bloodstream to be used by the body for energy. If there is too much glucose in the blood, some glucose is not reabsorbed and instead leaves the body through urine. ?What kind of sample is taken? ? ?A urine sample is required for this test. You may be asked to provide just one urine sample (random specimen), or you may be asked to collect urine samples at home over a period of 24 hours (24-hour specimen). ?How do I collect samples at home? ?You usually collect a urine sample by urinating into a clean cup. When collecting a urine sample at home, make sure you: ?Use supplies and instructions that you received from the lab. ?Collect urine only in the germ-free (sterile) cup that you received from the lab. ?Do not let any toilet paper or stool (feces) get into the cup. ?Refrigerate the sample until you can return it to the lab. ?Return the sample or samples to the lab as told. ?Tell a health care provider about: ?All medicines you are taking, including vitamins, herbs, eye drops, creams, and over-the-counter medicines. ?Any medical conditions you have. ?Whether you are pregnant or may be pregnant. ?How are the results reported? ?Random specimen test results will be reported as positive or negative for glucose. ?24-hour specimen test results will be reported as a value that indicates how much glucose is in your urine. Your health care provider will compare your results to normal ranges that were established after testing a large group of people  (reference ranges). Reference ranges may vary among labs and hospitals. For the 24-hour urine glucose test, a common reference range is 50-300 mg/day or 0.3-1.7 mmol/day (SI units). ?What do the results mean? ?If you had the random specimen test: ?A negative result means that you have no glucose in your urine or a very small amount of glucose in your urine. This is normal. ?A positive result means that you have too much glucose in your urine. ?If you had the 24-hour specimen test: ?A result within the reference range is considered normal. ?A result above the reference range means that you have too much glucose in your urine. ?If you have too much glucose in your urine, this may mean that you have: ?Diabetes. ?A temporary form of diabetes that develops during pregnancy (gestational diabetes). ?Kidney disease. ?Genetic defects in how the kidneys filter blood. ?Severe stress. ?Recent injury. ?Talk with your health care provider about what your results mean. ?Sometimes, the test results may report that a condition is present when it is not (false-positive result). ?Questions to ask your health care provider ?Ask your health care provider, or the department that is doing the test: ?When will my results be ready? ?How will I get my results? ?What are my treatment options? ?What other tests do I need? ?What are my next steps? ?Summary ?The urine glucose test measures how much sugar (glucose) is in your urine. ?Normally, the kidneys filter glucose out of the blood and then the glucose is reabsorbed into the  bloodstream to be used by the body for energy. If there is too much glucose in the blood, some glucose is not reabsorbed and instead leaves the body through urine. ?A positive result or a result that is higher than the reference range means that you have too much glucose in your urine. Talk with your health care provider about what your results mean. ?This information is not intended to replace advice given to you by  your health care provider. Make sure you discuss any questions you have with your health care provider. ?Document Revised: 07/08/2020 Document Reviewed: 07/08/2020 ?Elsevier Patient Education ? 2023 Elsevier Inc. ? ?

## 2022-04-02 NOTE — Progress Notes (Signed)
? ? ? ?This visit occurred during the SARS-CoV-2 public health emergency.  Safety protocols were in place, including screening questions prior to the visit, additional usage of staff PPE, and extensive cleaning of exam room while observing appropriate contact time as indicated for disinfecting solutions.  ? ? ?Nicole Carrillo , 07/30/1986, 36 y.o., female ?MRN: [id number] ?Patient Care Team  ?  Relationship Specialty Notifications Start End  ?Natalia LeatherwoodKuneff, Davyn Elsasser A, DO PCP - General Family Medicine  02/22/20   ?Thomasene Rippleobb, Kardie, DO PCP - Cardiology Cardiology  09/12/20   ?Ellamae SiaKim, Yoon M, DO Consulting Physician Allergy  04/28/20   ?Magda KielWagner, Jill M, MD  Obstetrics and Gynecology  03/07/21   ?Doristine BosworthSmith, William G. Jr., MD  Endocrinology  04/02/22   ? ? ?Chief Complaint  ?Patient presents with  ? Urinary Frequency  ?  Pt c/o urine urgency and freq x 1 mo worsening in the last 2 weeks;  ? ?  ?Subjective: Pt presents for an OV with complaints of urinary freq and urgency of worsening 2 weeks duration.  Associated symptoms include ovation of glucose into the 300 and 400s at times.  He is on an insulin pump and managed by endocrine.  She is breast-feeding her 1047-month-old and endorses having a decrease in production over the last couple weeks.  She denies burning with urination or vaginal discharge.  She denies abdominal pain or fever ?[id number], No LMP recorded. (Menstrual status: Lactating). ?Sugars have been elevated per pt. She is on an insulin pump.  ? ? ? ?  05/16/2021  ?  1:16 PM 04/03/2021  ?  1:09 PM 03/07/2021  ? 11:01 AM 02/22/2020  ?  3:30 PM 02/17/2020  ?  9:55 AM  ?Depression screen PHQ 2/9  ?Decreased Interest 0 0 0 0 0  ?Down, Depressed, Hopeless 0 0 0 0 0  ?PHQ - 2 Score 0 0 0 0 0  ?Altered sleeping 0  0  0  ?Tired, decreased energy 0  0  0  ?Change in appetite 0  0  0  ?Feeling bad or failure about yourself  0  0  0  ?Trouble concentrating 0  0  0  ?Moving slowly or fidgety/restless 0  0  0  ?Suicidal thoughts 0  0  0  ?PHQ-9 Score 0  0  0   ?Difficult doing work/chores     Not difficult at all  ? ? ?Allergies  ?Allergen Reactions  ? Molds & Smuts Itching and Shortness Of Breath  ? Shellfish Allergy Shortness Of Breath and Rash  ?  Crab causes SOB, Scallops cause rash.  Can eat Shrimp and lobster.  ? Acetaminophen Other (See Comments)  ?  Effects blood sugars and insulin pump.  ? Cephalexin Nausea Only and Other (See Comments)  ?  Headaches, dizziness  ? Pneumococcal Vaccines Swelling  ?  Local injection swelling  ? Prednisone Nausea Only and Other (See Comments)  ?  Headaches, dizziness, Prefers not to take because of uncontrollable BG's on this med;  ? ?  ? Soy Allergy Other (See Comments)  ?  Blood test  ? Adhesive [Tape] Rash  ?  With extended exposure  ? Dust Mite Extract Rash  ? Peanut-Containing Drug Products Rash  ? ?Social History  ? ?Social History Narrative  ? Marital status/children/pets: Married. Soon to have 1 son.   ? Education/employment: MSN, Nurse.   ? Safety:   ?   -smoke alarm in the home:Yes  ?   - wears  seatbelt: Yes  ?   - Feels safe in their relationships: Yes  ? ?Past Medical History:  ?Diagnosis Date  ? Adverse food reaction 04/28/2020  ? Allergy   ? Angio-edema   ? Anxiety   ? Asthma   ? Chicken pox   ? Constipation   ? Diabetes mellitus type 1 (HCC)   ? Dysphagia   ? Dysthymia   ? Eczema   ? Elevated platelet count   ? Fetal CPAM   ? Folliculitis   ? GERD (gastroesophageal reflux disease)   ? Hashimoto's thyroiditis   ? History of migraine   ? Hyperlipidemia   ? Insulin pump in place   ? Left breast mass 01/14/2017  ? fibroadenoma- benign  ? OCD (obsessive compulsive disorder)   ? Orthodontics   ? permanent upper and lower retainers  ? Preeclampsia 02/24/2020  ? PVD (posterior vitreous detachment), right   ? Rectal bleeding   ? Rectal fissure   ? Tinnitus   ? Urticaria   ? Wears contact lenses   ? ?Past Surgical History:  ?Procedure Laterality Date  ? CESAREAN SECTION  11/26/2021  ? DENTAL SURGERY    ? ENDOSCOPIC CONCHA  BULLOSA RESECTION Bilateral 07/24/2018  ? Procedure: ENDOSCOPIC CONCHA BULLOSA MIDDLE TURBINATE;  Surgeon: Vernie Murders, MD;  Location: [facility];  Service: ENT;  Laterality: Bilateral;  Diabetic - insulin pump  ? SEPTOPLASTY N/A 07/24/2018  ? Procedure: SEPTOPLASTY;  Surgeon: Vernie Murders, MD;  Location: [facility];  Service: ENT;  Laterality: N/A;  NEEDS TO BE FIRST INSULIN PUMP  ? TURBINATE REDUCTION Bilateral 07/24/2018  ? Procedure: INFERIOR TURBINATE REDUCTION;  Surgeon: Vernie Murders, MD;  Location: [facility];  Service: ENT;  Laterality: Bilateral;  ? WISDOM TOOTH EXTRACTION    ? ?Family History  ?Problem Relation Age of Onset  ? Arthritis Mother   ? Stroke Mother   ? Miscarriages / [place] Mother   ? Arthritis Father   ? Dementia Maternal Grandmother   ? Emphysema Maternal Grandfather   ? ?Allergies as of 04/02/2022   ? ?   Reactions  ? Molds & Smuts Itching, Shortness Of Breath  ? Shellfish Allergy Shortness Of Breath, Rash  ? Crab causes SOB, Scallops cause rash.  Can eat Shrimp and lobster.  ? Acetaminophen Other (See Comments)  ? Effects blood sugars and insulin pump.  ? Cephalexin Nausea Only, Other (See Comments)  ? Headaches, dizziness  ? Pneumococcal Vaccines Swelling  ? Local injection swelling  ? Prednisone Nausea Only, Other (See Comments)  ? Headaches, dizziness, Prefers not to take because of uncontrollable BG's on this med;   ? Soy Allergy Other (See Comments)  ? Blood test  ? Adhesive [tape] Rash  ? With extended exposure  ? Dust Mite Extract Rash  ? Peanut-containing Drug Products Rash  ? ?  ? ?  ?Medication List  ?  ? ?  ? Accurate as of April 02, 2022 11:51 AM. If you have any questions, ask your nurse or doctor.  ?  ?  ? ?  ? ?albuterol 108 (90 Base) MCG/ACT inhaler ?Commonly known as: VENTOLIN HFA ?Inhale 2 puffs into the lungs every 4 (four) hours as needed for wheezing or shortness of breath. ?  ?cetirizine 10 MG tablet ?Commonly known as: ZYRTEC ?Take  10 mg by mouth daily. ?  ?COLACE PO ?Take by mouth. ?  ?Dexcom G6 Sensor Misc ?1 Device by Does not apply route as directed. ?  ?  Dexcom G6 Transmitter Misc ?1 Device by Does not apply route as directed. ?  ?diphenhydrAMINE 25 mg capsule ?Commonly known as: BENADRYL ?Take by mouth. ?  ?EPINEPHrine 0.3 mg/0.3 mL Soaj injection ?Commonly known as: Auvi-Q ?Inject 0.3 mg into the muscle as needed for anaphylaxis. ?  ?fluticasone furoate-vilanterol 100-25 MCG/ACT Aepb ?Commonly known as: Breo Ellipta ?Inhale 1 puff into the lungs daily. Rinse mouth after each use. ?  ?Glucagon 1 MG/0.2ML Soaj ?Inject 1 mg into the skin as directed. ?  ?Microlet Lancets Misc ?  ?montelukast 10 MG tablet ?Commonly known as: SINGULAIR ?TAKE 1 TABLET BY MOUTH EVERY DAY ?  ?NON FORMULARY ?Continuous glucose monitor ?  ?NovoLOG 100 UNIT/ML injection ?Generic drug: insulin aspart ?SMARTSIG:0-40 Unit(s) SUB-Q Daily ?  ?prenatal multivitamin Tabs tablet ?Take 1 tablet by mouth daily at 12 noon. ?  ?PROBIOTIC DAILY PO ?Take by mouth. ?  ? ?  ? ? ?All past medical history, surgical history, allergies, family history, immunizations andmedications were updated in the EMR today and reviewed under the history and medication portions of their EMR.    ? ?ROS ?Negative, with the exception of above mentioned in HPI ? ? ?Objective:  ?BP 124/73   Pulse 72   Temp 97.8 ?F (36.6 ?C) (Oral)   Ht 5\' 2"  (1.575 m)   Wt 109 lb (49.4 kg)   SpO2 97%   Breastfeeding Yes   BMI 19.94 kg/m?  ?Body mass index is 19.94 kg/m? ?Physical Exam ?Vitals and nursing note reviewed.  ?Constitutional:   ?   General: She is not in acute distress. ?   Appearance: Normal appearance. She is normal weight. She is not ill-appearing or toxic-appearing.  ?HENT:  ?   Mouth/Throat:  ?   Mouth: Mucous membranes are dry.  ?Eyes:  ?   Extraocular Movements: Extraocular movements intact.  ?   Conjunctiva/sclera: Conjunctivae normal.  ?   Pupils: Pupils are equal, round, and reactive to  light.  ?Cardiovascular:  ?   Rate and Rhythm: Normal rate and regular rhythm.  ?Pulmonary:  ?   Effort: Pulmonary effort is normal.  ?   Breath sounds: Normal breath sounds.  ?Abdominal:  ?   General: Abdomen is flat

## 2022-04-03 LAB — URINALYSIS W MICROSCOPIC + REFLEX CULTURE
Bacteria, UA: NONE SEEN /HPF
Bilirubin Urine: NEGATIVE
Hgb urine dipstick: NEGATIVE
Hyaline Cast: NONE SEEN /LPF
Ketones, ur: NEGATIVE
Leukocyte Esterase: NEGATIVE
Nitrites, Initial: NEGATIVE
Protein, ur: NEGATIVE
RBC / HPF: NONE SEEN /HPF (ref 0–2)
Specific Gravity, Urine: 1.029 (ref 1.001–1.035)
WBC, UA: NONE SEEN /HPF (ref 0–5)
pH: 6 (ref 5.0–8.0)

## 2022-04-03 LAB — NO CULTURE INDICATED

## 2022-04-10 ENCOUNTER — Encounter: Payer: Self-pay | Admitting: Family Medicine

## 2022-04-10 ENCOUNTER — Other Ambulatory Visit: Payer: Self-pay

## 2022-04-10 ENCOUNTER — Ambulatory Visit: Payer: BC Managed Care – PPO | Admitting: Family Medicine

## 2022-04-10 VITALS — BP 122/72 | HR 67 | Temp 97.3°F | Resp 12

## 2022-04-10 DIAGNOSIS — J4540 Moderate persistent asthma, uncomplicated: Secondary | ICD-10-CM | POA: Diagnosis not present

## 2022-04-10 DIAGNOSIS — J302 Other seasonal allergic rhinitis: Secondary | ICD-10-CM | POA: Diagnosis not present

## 2022-04-10 DIAGNOSIS — H1010 Acute atopic conjunctivitis, unspecified eye: Secondary | ICD-10-CM

## 2022-04-10 DIAGNOSIS — J3089 Other allergic rhinitis: Secondary | ICD-10-CM

## 2022-04-10 DIAGNOSIS — L503 Dermatographic urticaria: Secondary | ICD-10-CM | POA: Diagnosis not present

## 2022-04-10 DIAGNOSIS — H1013 Acute atopic conjunctivitis, bilateral: Secondary | ICD-10-CM | POA: Diagnosis not present

## 2022-04-10 DIAGNOSIS — T783XXD Angioneurotic edema, subsequent encounter: Secondary | ICD-10-CM

## 2022-04-10 MED ORDER — EPINEPHRINE 0.3 MG/0.3ML IJ SOAJ: 0.3000 mg | INTRAMUSCULAR | 2 refills | Status: AC | PRN

## 2022-04-10 MED ORDER — OMEPRAZOLE 20 MG PO CPDR: 20.0000 mg | DELAYED_RELEASE_CAPSULE | Freq: Every day | ORAL | 5 refills | Status: DC

## 2022-04-10 NOTE — Progress Notes (Signed)
? ?[address] ?[place] [address] ?Dept: [phone] ? ?FOLLOW UP NOTE ? ?Patient ID: Nicole Carrillo, female    DOB: 1986/04/13  Age: 36 y.o. MRN: [id number] ?Date of Office Visit: 04/10/2022 ? ?Assessment  ?Chief Complaint: Asthma (Shortness of breath same as before, itchiness in throat and more flare ups. Zyrtec 2 times daily. Pepcid once a day. Last week felt like her throat was closing and pressure on chest. ) ? ?HPI ?Nicole Carrillo is a 36 year old female who presents the clinic for follow-up visit.  She was last seen in this clinic on 01/01/2022 by Dr. Selena Batten for evaluation of asthma, allergic rhinitis, dermatographia some, idiopathic allergic reaction, and food allergy to sesame, peanut, and tree nuts.  At today's visit, she reports that she is beginning to experience more anxiety revolving around asthma, allergies, and allergic reactions. Her asthma has been moderately well controlled with some intermittent shortness of breath or "heaviness" which is worse after she has been outside.  She denies cough and wheeze with activity or rest.  She continues Breo 100-1 puff once a day and montelukast 10 mg once a day.  She has not used albuterol for the last several months.  Allergic rhinitis is reported as moderately well controlled with occasional rhinorrhea nasal congestion, sneezing, and postnasal drainage which is worse in the spring.  She continues cetirizine 10 mg twice a day and is not currently using Flonase or nasal saline rinses.  She reports that Flonase did not work well for her. In 2018 environmental allergy skin testing by ENT that was positive to grass, ragweed, weed, trees, molds, dust mites, cockroach, horse for which she had SLIT therapy by ENT for a few months with no benefit.  More recently, 2021 bloodwork was positive to dust mites, grass pollen, mold, tree pollen, ragweed pollen and weed pollen. She is moderately interested in allergen immunotherapy, however, has some anxiety regarding  injections and reactions. She continues to avoid sesame, peanuts, tree nuts, corn and soy with no intentional or accidental ingestion since her last visit to this clinic. Her most recent food allergy testing was via lab on 01/01/2022 which was borderline  positive to  peanut, macadamia nut, pistachio, soy, wheat, and corn. Sesame was positive but lower than before.   She is interested in performing an in office food challenge to these foods 1 at a time, however, she reports that she is unable to stop taking cetirizine at this time.  She reports that when she does stop cetirizine she begins to experience widespread pruritus.  She has been on cetirizine for the last several years.  She does report that she began to experience a sensation of throat "thickness and lumpy throat" that lasted for about 1.5 days last weekend.  She denies cardiopulmonary or gastrointestinal symptoms with this throat fullness.  She did not use an epinephrine autoinjector for this throat fullness.  She did begin taking famotidine at that time.  She denies reflux and is not currently taking a medication on a regular basis to control reflux. She continues to breastfeed. Her current medications are listed in the chart.  ? ? ? ?Drug Allergies:  ?Allergies  ?Allergen Reactions  ? Molds & Smuts Itching and Shortness Of Breath  ? Shellfish Allergy Shortness Of Breath and Rash  ?  Crab causes SOB, Scallops cause rash.  Can eat Shrimp and lobster.  ? Acetaminophen Other (See Comments)  ?  Effects blood sugars and insulin pump.  ? Cephalexin Nausea Only  and Other (See Comments)  ?  Headaches, dizziness  ? Pneumococcal Vaccines Swelling  ?  Local injection swelling  ? Prednisone Nausea Only and Other (See Comments)  ?  Headaches, dizziness, Prefers not to take because of uncontrollable BG's on this med;  ? ?  ? Soy Allergy Other (See Comments)  ?  Blood test  ? Adhesive [Tape] Rash  ?  With extended exposure  ? Dust Mite Extract Rash  ? Peanut-Containing  Drug Products Rash  ? ? ?Physical Exam: ?BP 122/72   Pulse 67   Temp (!) 97.3 ?F (36.3 ?C) (Temporal)   Resp 12   SpO2 99%   ? ?Physical Exam ?Vitals reviewed.  ?Constitutional:   ?   Appearance: Normal appearance.  ?HENT:  ?   Head: Normocephalic and atraumatic.  ?   Right Ear: Tympanic membrane normal.  ?   Left Ear: Tympanic membrane normal.  ?   Nose:  ?   Comments: Nares slightly erythematous with clear nasal drainage noted.  Pharynx erythematous with no exudate.  Ears normal.  Eyes normal. ?Eyes:  ?   Conjunctiva/sclera: Conjunctivae normal.  ?Cardiovascular:  ?   Rate and Rhythm: Normal rate and regular rhythm.  ?   Heart sounds: Normal heart sounds. No murmur heard. ?Pulmonary:  ?   Effort: Pulmonary effort is normal.  ?   Breath sounds: Normal breath sounds.  ?   Comments: Lungs clear to auscultation ?Musculoskeletal:     ?   General: Normal range of motion.  ?   Cervical back: Normal range of motion and neck supple.  ?Skin: ?   General: Skin is warm and dry.  ?Neurological:  ?   Mental Status: She is alert and oriented to person, place, and time.  ?Psychiatric:     ?   Mood and Affect: Mood normal.     ?   Thought Content: Thought content normal.     ?   Judgment: Judgment normal.  ? ? ?Diagnostics: ?FVC 3.18, FEV1 2.81.  Predicted FVC 2.91, predicted FEV1 2.45.  Spirometry indicates normal ventilatory function. ? ?Assessment and Plan: ?1. Moderate persistent asthma without complication   ?2. Seasonal and perennial allergic rhinoconjunctivitis   ?3. Angioedema, subsequent encounter   ?4. Dermatographism   ?5. Breast feeding status of mother   ? ? ?Meds ordered this encounter  ?Medications  ? omeprazole (PRILOSEC) 20 MG capsule  ?  Sig: Take 1 capsule (20 mg total) by mouth daily.  ?  Dispense:  30 capsule  ?  Refill:  5  ? ? ?Patient Instructions  ?Asthma ?Continue montelukast 10 mg once a day to prevent cough or wheeze ?Continue Breo 100-1 puff once a day to prevent cough or wheeze ?You may use  albuterol 2 puffs once every 4 hours as needed for cough or wheeze ?You may use albuterol 2 puffs 5 to 15 minutes before activity to decrease cough or wheeze ?For asthma flare, begin Breo 200-1 puff once a day for 2 weeks or until cough and wheeze free ? ?Allergic rhinitis ?Continue allergen avoidance measures directed toward dust mites and pollen as listed below ?Begin Xyzal 5 mg mg once or twice a day as needed for runny nose or itch. This will replace cetirizine. Remember to rotate to a different antihistamine about every 3 months. Some examples of over the counter antihistamines include Zyrtec (cetirizine), Xyzal (levocetirizine), Allegra (fexofenadine), and Claritin (loratidine).  ?Continue Flonase 2 sprays in each nostril once a day  as needed for stuffy nose . In the right nostril, point the applicator out toward the right ear. In the left nostril, point the applicator out toward the left ear ?Consider saline nasal rinses as needed for nasal symptoms. Use this before any medicated nasal sprays for best result ?If your symptoms are not well controlled with the treatment plan as listed above, consider allergen immunotherapy.  Information packet provided at today's visit ? ?Dermatographism ?Continue levocetirizine up to twice a day as needed ?Continue proper skin care ? ?Allergic reaction ?In case of an allergic reaction, take Benadryl 50 mg every 4 hours, and if life-threatening symptoms occur, inject with EpiPen 0.3 mg. ?If your symptoms re-occur, begin a journal of events that occurred for up to 6 hours before your symptoms began including foods and beverages consumed, soaps or perfumes you had contact with, and medications.  ? ?Food allergy ?Continue to avoid sesame, peanut, tree nut, corn, and soy. In case of an allergic reaction, take Benadryl 50 mg every 4 hours, and if life-threatening symptoms occur, inject with EpiPen 0.3 mg. Keep your epinephrine auto-injectors in a set of 2 ?Make an appointment for a  food challenge if you are interested in eating challenging peanuts or tree nuts. ? ?Reflux ?Begin dietary and lifestyle modifications as listed below ?Begin omeprazole 20 mg once a day to control reflux. Take this

## 2022-04-10 NOTE — Addendum Note (Signed)
Addended by: Orson Aloe on: 04/10/2022 04:51 PM ? ? Modules accepted: Orders ? ?

## 2022-04-10 NOTE — Patient Instructions (Addendum)
Asthma ?Continue montelukast 10 mg once a day to prevent cough or wheeze ?Continue Breo 100-1 puff once a day to prevent cough or wheeze ?You may use albuterol 2 puffs once every 4 hours as needed for cough or wheeze ?You may use albuterol 2 puffs 5 to 15 minutes before activity to decrease cough or wheeze ?For asthma flare, begin Breo 200-1 puff once a day for 2 weeks or until cough and wheeze free ? ?Allergic rhinitis ?Continue allergen avoidance measures directed toward dust mites and pollen as listed below ?Begin Xyzal 5 mg mg once or twice a day as needed for runny nose or itch. This will replace cetirizine. Remember to rotate to a different antihistamine about every 3 months. Some examples of over the counter antihistamines include Zyrtec (cetirizine), Xyzal (levocetirizine), Allegra (fexofenadine), and Claritin (loratidine).  ?Continue Flonase 2 sprays in each nostril once a day as needed for stuffy nose . In the right nostril, point the applicator out toward the right ear. In the left nostril, point the applicator out toward the left ear ?Consider saline nasal rinses as needed for nasal symptoms. Use this before any medicated nasal sprays for best result ?If your symptoms are not well controlled with the treatment plan as listed above, consider allergen immunotherapy.  Information packet provided at today's visit ? ?Dermatographism ?Continue levocetirizine up to twice a day as needed ?Continue proper skin care ? ?Allergic reaction ?In case of an allergic reaction, take Benadryl 50 mg every 4 hours, and if life-threatening symptoms occur, inject with EpiPen 0.3 mg. ?If your symptoms re-occur, begin a journal of events that occurred for up to 6 hours before your symptoms began including foods and beverages consumed, soaps or perfumes you had contact with, and medications.  ? ?Food allergy ?Continue to avoid sesame, peanut, tree nut, corn, and soy. In case of an allergic reaction, take Benadryl 50 mg every 4  hours, and if life-threatening symptoms occur, inject with EpiPen 0.3 mg. Keep your epinephrine auto-injectors in a set of 2 ?Make an appointment for a food challenge if you are interested in eating challenging peanuts or tree nuts. ? ?Reflux ?Begin dietary and lifestyle modifications as listed below ?Begin omeprazole 20 mg once a day to control reflux. Take this medication 30 minutes before your first meal. We will re-evaluate if this helps to control your symptoms after 30 days. ? ?Call the clinic if this treatment plan is not working well for you ? ?Follow up in 1 month or sooner if needed. ? ?Reducing Pollen Exposure ?The American Academy of Allergy, Asthma and Immunology suggests the following steps to reduce your exposure to pollen during allergy seasons. ?Do not hang sheets or clothing out to dry; pollen may collect on these items. ?Do not mow lawns or spend time around freshly cut grass; mowing stirs up pollen. ?Keep windows closed at night.  Keep car windows closed while driving. ?Minimize morning activities outdoors, a time when pollen counts are usually at their highest. ?Stay indoors as much as possible when pollen counts or humidity is high and on windy days when pollen tends to remain in the air longer. ?Use air conditioning when possible.  Many air conditioners have filters that trap the pollen spores. ?Use a HEPA room air filter to remove pollen form the indoor air you breathe. ? ? ?Control of Dust Mite Allergen ?Dust mites play a major role in allergic asthma and rhinitis. They occur in environments with high humidity wherever human skin is found.  Dust mites absorb humidity from the atmosphere (ie, they do not drink) and feed on organic matter (including shed human and animal skin). Dust mites are a microscopic type of insect that you cannot see with the naked eye. High levels of dust mites have been detected from mattresses, pillows, carpets, upholstered furniture, bed covers, clothes, soft toys  and any woven material. The principal allergen of the dust mite is found in its feces. A gram of dust may contain 1,000 mites and 250,000 fecal particles. Mite antigen is easily measured in the air during house cleaning activities. Dust mites do not bite and do not cause harm to humans, other than by triggering allergies/asthma. ? ?Ways to decrease your exposure to dust mites in your home: ? ?1. Encase mattresses, box springs and pillows with a mite-impermeable barrier or cover ? ?2. Wash sheets, blankets and drapes weekly in hot water (130? F) with detergent and dry them in a dryer on the hot setting. ? ?3. Have the room cleaned frequently with a vacuum cleaner and a damp dust-mop. For carpeting or rugs, vacuuming with a vacuum cleaner equipped with a high-efficiency particulate air (HEPA) filter. The dust mite allergic individual should not be in a room which is being cleaned and should wait 1 hour after cleaning before going into the room. ? ?4. Do not sleep on upholstered furniture (eg, couches). ? ?5. If possible removing carpeting, upholstered furniture and drapery from the home is ideal. Horizontal blinds should be eliminated in the rooms where the person spends the most time (bedroom, study, television room). Washable vinyl, roller-type shades are optimal. ? ?6. Remove all non-washable stuffed toys from the bedroom. Wash stuffed toys weekly like sheets and blankets above. ? ?7. Reduce indoor humidity to less than 50%. Inexpensive humidity monitors can be purchased at most hardware stores. Do not use a humidifier as can make the problem worse and are not recommended. ? ?

## 2022-05-10 ENCOUNTER — Encounter: Payer: Self-pay | Admitting: Family Medicine

## 2022-05-11 NOTE — Telephone Encounter (Signed)
Let's switch her inhaler to Breo 200-1 puff once a day. Please have her continue allergen avoidance measures directed toward pollen, mold, and ragweed. Thank you

## 2022-05-15 ENCOUNTER — Other Ambulatory Visit: Payer: Self-pay | Admitting: *Deleted

## 2022-05-15 MED ORDER — FLUTICASONE FUROATE-VILANTEROL 200-25 MCG/ACT IN AEPB
1.0000 | INHALATION_SPRAY | Freq: Every day | RESPIRATORY_TRACT | 5 refills | Status: DC
Start: 2022-05-15 — End: 2022-07-25

## 2022-05-17 ENCOUNTER — Ambulatory Visit: Payer: BC Managed Care – PPO | Admitting: Family Medicine

## 2022-06-05 ENCOUNTER — Encounter: Payer: Self-pay | Admitting: Family Medicine

## 2022-06-05 ENCOUNTER — Ambulatory Visit: Payer: BC Managed Care – PPO | Admitting: Family Medicine

## 2022-06-05 VITALS — BP 124/86 | HR 74 | Temp 98.4°F | Resp 20 | Ht 62.0 in | Wt 101.5 lb

## 2022-06-05 DIAGNOSIS — H1013 Acute atopic conjunctivitis, bilateral: Secondary | ICD-10-CM

## 2022-06-05 DIAGNOSIS — H1010 Acute atopic conjunctivitis, unspecified eye: Secondary | ICD-10-CM

## 2022-06-05 DIAGNOSIS — J4540 Moderate persistent asthma, uncomplicated: Secondary | ICD-10-CM | POA: Diagnosis not present

## 2022-06-05 DIAGNOSIS — L503 Dermatographic urticaria: Secondary | ICD-10-CM

## 2022-06-05 DIAGNOSIS — T7801XA Anaphylactic reaction due to peanuts, initial encounter: Secondary | ICD-10-CM | POA: Diagnosis not present

## 2022-06-05 DIAGNOSIS — T7801XD Anaphylactic reaction due to peanuts, subsequent encounter: Secondary | ICD-10-CM

## 2022-06-05 DIAGNOSIS — T7819XD Other adverse food reactions, not elsewhere classified, subsequent encounter: Secondary | ICD-10-CM

## 2022-06-05 DIAGNOSIS — K219 Gastro-esophageal reflux disease without esophagitis: Secondary | ICD-10-CM

## 2022-06-05 DIAGNOSIS — J302 Other seasonal allergic rhinitis: Secondary | ICD-10-CM | POA: Diagnosis not present

## 2022-06-05 DIAGNOSIS — T7840XD Allergy, unspecified, subsequent encounter: Secondary | ICD-10-CM

## 2022-06-05 DIAGNOSIS — T781XXD Other adverse food reactions, not elsewhere classified, subsequent encounter: Secondary | ICD-10-CM

## 2022-06-05 MED ORDER — FLUTICASONE PROPIONATE HFA 110 MCG/ACT IN AERO: 2.0000 | INHALATION_SPRAY | Freq: Two times a day (BID) | RESPIRATORY_TRACT | 5 refills | Status: DC

## 2022-06-16 ENCOUNTER — Other Ambulatory Visit: Payer: Self-pay | Admitting: Allergy

## 2022-06-26 ENCOUNTER — Encounter: Payer: Self-pay | Admitting: Family Medicine

## 2022-06-26 ENCOUNTER — Ambulatory Visit: Payer: BC Managed Care – PPO | Admitting: Family Medicine

## 2022-06-26 VITALS — BP 122/74 | HR 77 | Temp 98.6°F | Resp 16

## 2022-06-26 DIAGNOSIS — T7805XD Anaphylactic reaction due to tree nuts and seeds, subsequent encounter: Secondary | ICD-10-CM

## 2022-06-26 DIAGNOSIS — T781XXD Other adverse food reactions, not elsewhere classified, subsequent encounter: Secondary | ICD-10-CM

## 2022-06-26 DIAGNOSIS — T7805XA Anaphylactic reaction due to tree nuts and seeds, initial encounter: Secondary | ICD-10-CM | POA: Insufficient documentation

## 2022-06-26 DIAGNOSIS — J4540 Moderate persistent asthma, uncomplicated: Secondary | ICD-10-CM | POA: Diagnosis not present

## 2022-06-26 MED ORDER — MONTELUKAST SODIUM 10 MG PO TABS: 10.0000 mg | ORAL_TABLET | Freq: Every day | ORAL | 0 refills | Status: DC

## 2022-06-26 NOTE — Progress Notes (Signed)
[address] [place] [address] Dept: [phone]  FOLLOW UP NOTE  Patient ID: Nicole Carrillo, female    DOB: 01-Dec-1986  Age: 36 y.o. MRN: [id number] Date of Office Visit: 06/26/2022  Assessment  Chief Complaint: Food/Drug Challenge (Tree nuts)  HPI Nicole Carrillo is a 36 year old female who presents to the clinic for follow-up visit with possible food challenge to tree nut mix.  She was last seen in this clinic on 06/05/2022 for a successful peanut oral food challenge and prior to that she was seen in this clinic on 04/10/2022 by Thermon Leyland, FNP, for evaluation of asthma, allergic rhinitis, dermatographia, idiopathic allergic reaction, reflux, and food allergy to peanut, tree nut, corn, and sesame. She is accompanied by her mother who assists with history. She reports feeling well overall with no cardiopulmonary or gastrointestinal symptoms. She does report that, since stopping daily antihistamines several weeks ago, she experiences small patches of papular urticaria that occurs daily and resolves within 30-60 minutes with no medical intervention. She continues to consume peanuts with no adverse reaction. She continues to avoid sesame, corn, soy, shellfish, and tree nuts. Her current medications are listed in the chart.    Drug Allergies:  Allergies  Allergen Reactions   Molds & Smuts Itching and Shortness Of Breath   Shellfish Allergy Shortness Of Breath and Rash    Crab causes SOB, Scallops cause rash.  Can eat Shrimp and lobster.   Acetaminophen Other (See Comments)    Effects blood sugars and insulin pump.   Cephalexin Nausea Only and Other (See Comments)    Headaches, dizziness   Pneumococcal Vaccines Swelling    Local injection swelling   Prednisone Nausea Only and Other (See Comments)    Headaches, dizziness, Prefers not to take because of uncontrollable BG's on this med;      Soy Allergy Other (See Comments)    Blood test   Adhesive [Tape] Rash    With extended  exposure   Dust Mite Extract Rash   Peanut-Containing Drug Products Rash    Physical Exam: BP 122/74   Pulse 77   Temp 98.6 F (37 C) (Temporal)   Resp 16   SpO2 98%    Physical Exam Vitals reviewed.  Constitutional:      Appearance: Normal appearance.  HENT:     Head: Normocephalic and atraumatic.     Right Ear: Tympanic membrane normal.     Left Ear: Tympanic membrane normal.     Nose:     Comments: Bilateral nares slightly erythematous with clear nasal drainage noted. Pharynx erythematous with cobblestone appearance. No pharyngeal exudate noted. Ears normal. Eyes normal. Eyes:     Conjunctiva/sclera: Conjunctivae normal.  Cardiovascular:     Rate and Rhythm: Normal rate and regular rhythm.     Heart sounds: Normal heart sounds. No murmur heard. Pulmonary:     Effort: Pulmonary effort is normal.     Breath sounds: Normal breath sounds.     Comments: Lungs clear to auscultation Musculoskeletal:        General: Normal range of motion.     Cervical back: Normal range of motion and neck supple.  Skin:    General: Skin is warm and dry.  Neurological:     Mental Status: She is alert and oriented to person, place, and time.  Psychiatric:        Mood and Affect: Mood normal.        Behavior: Behavior normal.  Thought Content: Thought content normal.        Judgment: Judgment normal.    Diagnostics: FVC 3.00, FEV1 2.79.  Predicted FVC 2.90, predicted FEV1 2.43.  Spirometry indicates normal ventilatory function.  Procedure note: Written consent obtained Open graded tree nut oral challenge: The patient was able to tolerate the challenge today without adverse signs or symptoms. She developed a small papular rash on her neck and one on her left forearm after the 8 gram dose, each of which resolved in less than 10 minutes with no medical intervention. Vital signs within normal limits. Physical exam within normal limits. She denied concomitant cardiopulmonary and  gastrointestinal symptoms with the rash. Vital signs were stable throughout the challenge and observation period. She received multiple doses separated by 15 minutes, each of which was separated by vitals and a brief physical exam. She received the following doses: lip rub, 1 gm, 2 gm, 4 gm, 8 gm, and 16 gm. She was monitored for 60 minutes following the last dose.  Total testing time: 172 minutes  The patient was able to tolerate the open graded oral challenge today without adverse signs or symptoms. Therefore, she has the same risk of systemic reaction associated with the consumption of tree nuts  as the general population.   Assessment and Plan: 1. Anaphylactic reaction due to tree nuts and seeds, subsequent encounter   2. Adverse food reaction, subsequent encounter   3. Moderate persistent asthma without complication     Meds ordered this encounter  Medications   montelukast (SINGULAIR) 10 MG tablet    Sig: Take 1 tablet (10 mg total) by mouth daily.    Dispense:  90 tablet    Refill:  0    Patient Instructions  In office food challenge to mixed tree nuts Nicole Carrillo was able to tolerate the mixed tree nut food challenge today at the office without adverse signs or symptoms of an allergic reaction. Therefore, she has the same risk of systemic reaction associated with the consumption of tree nuts as the general population.  - Do not consume any tree nuts  for the next 24 hours. - Monitor for allergic symptoms such as rash, wheezing, diarrhea, swelling, and vomiting for the next 24 hours. If severe symptoms occur, treat with EpiPen injection and call 911. For less severe symptoms treat with Benadryl 50 mg every 4 hours and call the clinic.  - If no allergic symptoms are evident, reintroduce tree nuts  into the diet. If you develop an allergic reaction to tree nuts , record what was eaten the amount eaten, preparation method, time from ingestion to reaction, and symptoms.   Food  allergy Continue to avoid sesame, corn, soy, and shellfish.  In case of an allergic reaction, take Benadryl 50 mg every 4 hours, and if life-threatening symptoms occur, inject with EpiPen 0.3 mg. Return to the clinic for food challenges if interested. For soy challenge, you can bring soy milk, soy yogurt or soy ice cream.   Call the clinic if this treatment plan is not working well for you  Follow up in 2 weeks or sooner if needed.     Return in about 2 weeks (around 07/10/2022), or if symptoms worsen or fail to improve.    Thank you for the opportunity to care for this patient.  Please do not hesitate to contact me with questions.  Thermon Leyland, FNP [facility]

## 2022-06-26 NOTE — Patient Instructions (Addendum)
In office food challenge to mixed tree nuts Myya Sheetz was able to tolerate the mixed tree nut food challenge today at the office without adverse signs or symptoms of an allergic reaction. Therefore, she has the same risk of systemic reaction associated with the consumption of tree nuts as the general population.  - Do not consume any tree nuts  for the next 24 hours. - Monitor for allergic symptoms such as rash, wheezing, diarrhea, swelling, and vomiting for the next 24 hours. If severe symptoms occur, treat with EpiPen injection and call 911. For less severe symptoms treat with Benadryl 50 mg every 4 hours and call the clinic.  - If no allergic symptoms are evident, reintroduce tree nuts  into the diet. If you develop an allergic reaction to tree nuts , record what was eaten the amount eaten, preparation method, time from ingestion to reaction, and symptoms.   Food allergy Continue to avoid sesame, corn, soy, and shellfish.  In case of an allergic reaction, take Benadryl 50 mg every 4 hours, and if life-threatening symptoms occur, inject with EpiPen 0.3 mg. Return to the clinic for food challenges if interested. For soy challenge, you can bring soy milk, soy yogurt or soy ice cream.   Call the clinic if this treatment plan is not working well for you  Follow up in 2 weeks or sooner if needed.

## 2022-07-23 ENCOUNTER — Other Ambulatory Visit: Payer: Self-pay | Admitting: Allergy

## 2022-07-24 ENCOUNTER — Other Ambulatory Visit: Payer: Self-pay | Admitting: Allergy

## 2022-07-25 MED ORDER — FLUTICASONE FUROATE-VILANTEROL 100-25 MCG/ACT IN AEPB: INHALATION_SPRAY | RESPIRATORY_TRACT | 1 refills | Status: DC

## 2022-07-25 NOTE — Telephone Encounter (Signed)
Spoke with patient she stated that Breo 200 was sent in and not Breo 100. Patient stated that they switched her to [facility] 200 due to an asthma flare however she has not picked it up as she didn't need it. Patients after visit summary from June stated to continue Breo 100. Patient requested Breo 100 90 day supply. Prescription has been sent to the requested pharmacy and patient made aware.

## 2022-07-25 NOTE — Addendum Note (Signed)
Addended by: Dub Mikes on: 07/25/2022 03:47 PM   Modules accepted: Orders

## 2022-07-30 NOTE — Progress Notes (Unsigned)
Follow Up Note  RE: Nicole Carrillo MRN: [id number] DOB: 05/11/1986 Date of Office Visit: 07/31/2022  Referring provider: Natalia Leatherwood, DO Primary care provider: Natalia Leatherwood, DO  Chief Complaint:No chief complaint on file.   Assessment and Plan: Nicole Carrillo is a 36 y.o. female with: No problem-specific Assessment & Plan notes found for this encounter.  No follow-ups on file.  Challenge food: *** Challenge as per protocol: {Blank single:19197::"Passed","Failed"} Total time: ***  Do not eat challenge food for next 24 hours and monitor for hives, swelling, shortness of breath and dizziness. If you see these symptoms, use Benadryl for mild symptoms and epinephrine for more severe symptoms and call 911.  If no adverse symptoms in the next 24 hours, repeat the challenge food the next day and observe for 1 hour. If no adverse symptoms, can eat the food on regular basis.   History of Present Illness: I had the pleasure of seeing Nicole Carrillo for a follow up visit at the [facility] on 07/30/2022. She is a 36 y.o. female, who is being followed for food allergies, asthma, allergic rhinoconjunctivitis, dermatographism. Her previous allergy office visit was on 06/26/2022 with Thermon Leyland, FNP. Today she is here for soy food challenge.   Asthma Past history - Diagnosed with asthma in 2018 by pulmonology via  Methacholine challenge test - data not available for review. Tried Arnuity, Breo with no benefit. No triggers noted. Normal EGD in past per patient report. 2021 spirometry was normal with marginal improvement in FEV1 post bronchodilator treatment. Clinically feeling about the same. Interim history - delivered baby. Doing well with Breo and Singulair.  Normal spirometry today. Daily controller medication(s): Breo 1 puff once a day and rinse mouth after each use.  Continue Singulair (montelukast) 10mg  daily at night. During upper respiratory  infections/asthma flares:  Start Breo 1 puff once a day for 1-2 weeks until your breathing symptoms return to baseline.  Pretreat with albuterol 2 puffs. May use albuterol rescue inhaler 2 puffs every 4 to 6 hours as needed for shortness of breath, chest tightness, coughing, and wheezing. May use albuterol rescue inhaler 2 puffs 5 to 15 minutes prior to strenuous physical activities. Monitor frequency of use.  Get spirometry at next visit.   Allergic reaction? Noted facial erythema and throat discomfort and one episode of lip swelling. Concerned about allergic reactions to something new. Denies any PND, reflux symptoms. These episodes last for a few hours and occur randomly. No triggers noted.  Continue proper skin care. Keep track of episodes and symptoms. Write down what you had come across that day.  Increase Zyrtec (cetirizine) 10mg  to twice a day. If you have issues with drowsiness or decreased breastmilk production then let know.  Get bloodwork as below.    Seasonal and perennial allergic rhinoconjunctivitis Past history - Perennial rhino conjunctivitis symptoms for the past 3 years. 2018 skin testing by ENT was positive to grass, ragweed, weed, trees, molds, dust mites, cockroach, horse. SLIT therapy by ENT x few months with no benefit. Deviated septum repair and turbinate reduction in 2019. 2021 bloodwork positive to dust mites, grass pollen, mold, tree pollen, ragweed pollen and weed pollen. Dymista caused nausea.  Interim history - stable. Unable to come off zyrtec due to itching.  Continue environmental control measures.  Continue Singulair (montelukast) 10mg  daily at night. Continue with Zyrtec (cetirizine) 10mg  1-2 times a day as needed.  Consider allergy injections for long term  control if above medications do not help the symptoms - handout given.    Adverse food reaction Past history - 2018 skin testing positive to soy, peanut and borderline to wheat. She had no  prior clinical reactions to these foods. 2021 bloodwork borderline positive to hazelnut, peanut, macademia nut, pistachios and soy. Positive to sesame seed. Tolerates soy and wheat. Interim history - no reactions but concerned if the above symptoms with the facial erythema/throat tightness means she is developing any new food allergies.  Continue to avoid sesame, peanuts and tree nuts. For mild symptoms you can take over the counter antihistamines such as Benadryl and monitor symptoms closely. If symptoms worsen or if you have severe symptoms including breathing issues, throat closure, significant swelling, whole body hives, severe diarrhea and vomiting, lightheadedness then inject epinephrine and seek immediate medical care afterwards. Food action plan in place.  Consider food challenge to peanuts once done with pregnancy.   Dermatographism Continue proper skin care. Zyrtec (cetirizine) 10mg  twice a day.   History of Reaction: In office food challenge to mixed tree nuts Nicole Carrillo was able to tolerate the mixed tree nut food challenge today at the office without adverse signs or symptoms of an allergic reaction. Therefore, she has the same risk of systemic reaction associated with the consumption of tree nuts as the general population.  - Do not consume any tree nuts  for the next 24 hours. - Monitor for allergic symptoms such as rash, wheezing, diarrhea, swelling, and vomiting for the next 24 hours. If severe symptoms occur, treat with EpiPen injection and call 911. For less severe symptoms treat with Benadryl 50 mg every 4 hours and call the clinic.  - If no allergic symptoms are evident, reintroduce tree nuts  into the diet. If you develop an allergic reaction to tree nuts , record what was eaten the amount eaten, preparation method, time from ingestion to reaction, and symptoms.    Food allergy Continue to avoid sesame, corn, soy, and shellfish.  In case of an allergic reaction, take  Benadryl 50 mg every 4 hours, and if life-threatening symptoms occur, inject with EpiPen 0.3 mg. Return to the clinic for food challenges if interested. For soy challenge, you can bring soy milk, soy yogurt or soy ice cream.    Call the clinic if this treatment plan is not working well for you  Labs/skin testing: Component     Latest Ref Rng 05/05/2020 01/01/2022  Soybean IgE     Class 0/I kU/L 0.28 !  0.26 !     Interval History: Patient has not been ill, she has not had any accidental exposures to the culprit food.   Recent/Current History: Pulmonary disease: {Blank single:19197::"yes","no"} Cardiac disease: {Blank single:19197::"yes","no"} Respiratory infection: {Blank single:19197::"yes","no"} Rash: {Blank single:19197::"yes","no"} Itch: {Blank single:19197::"yes","no"} Swelling: {Blank single:19197::"yes","no"} Cough: {Blank single:19197::"yes","no"} Shortness of breath: {Blank single:19197::"yes","no"} Runny/stuffy nose: {Blank single:19197::"yes","no"} Itchy eyes: {Blank single:19197::"yes","no"} Beta-blocker use: {Blank single:19197::"yes","no"}  Patient/guardian was informed of the test procedure with verbalized understanding of the risk of anaphylaxis. Consent was signed.   Last antihistamine use: *** Last beta-blocker use: ***  Medication List:  Current Outpatient Medications  Medication Sig Dispense Refill   albuterol (VENTOLIN HFA) 108 (90 Base) MCG/ACT inhaler Inhale 2 puffs into the lungs every 4 (four) hours as needed for wheezing or shortness of breath. 18 g 1   cetirizine (ZYRTEC) 10 MG tablet Take 10 mg by mouth daily. (Patient not taking: Reported on 06/05/2022)     Continuous Blood  Gluc Sensor (DEXCOM G6 SENSOR) MISC 1 Device by Does not apply route as directed. 9 each 3   Continuous Blood Gluc Transmit (DEXCOM G6 TRANSMITTER) MISC 1 Device by Does not apply route as directed. 1 each 3   diphenhydrAMINE (BENADRYL) 25 mg capsule Take by mouth. (Patient not  taking: Reported on 06/05/2022)     Docusate Sodium (COLACE PO) Take by mouth. (Patient not taking: Reported on 06/05/2022)     EPINEPHrine (AUVI-Q) 0.3 mg/0.3 mL IJ SOAJ injection Inject 0.3 mg into the muscle as needed for anaphylaxis. 1 each 2   fluticasone (FLOVENT HFA) 110 MCG/ACT inhaler Inhale 2 puffs into the lungs 2 (two) times daily. (Patient not taking: Reported on 06/26/2022) 1 each 5   fluticasone furoate-vilanterol (BREO ELLIPTA) 100-25 MCG/ACT AEPB INHALE 1 PUFF INTO THE LUNGS DAILY. RINSE MOUTH AFTER USE 180 each 1   Glucagon 1 MG/0.2ML SOAJ Inject 1 mg into the skin as directed. 0.2 mL 1   Microlet Lancets MISC      montelukast (SINGULAIR) 10 MG tablet Take 1 tablet (10 mg total) by mouth daily. 90 tablet 0   NON FORMULARY Continuous glucose monitor     NOVOLOG 100 UNIT/ML injection SMARTSIG:0-40 Unit(s) SUB-Q Daily     omeprazole (PRILOSEC) 20 MG capsule Take 1 capsule (20 mg total) by mouth daily. (Patient not taking: Reported on 06/26/2022) 30 capsule 5   Prenatal Vit-Fe Fumarate-FA (PRENATAL MULTIVITAMIN) TABS tablet Take 1 tablet by mouth daily at 12 noon.     Probiotic Product (PROBIOTIC DAILY PO) Take by mouth.     No current facility-administered medications for this visit.    Allergies: Allergies  Allergen Reactions   Molds & Smuts Itching and Shortness Of Breath   Shellfish Allergy Shortness Of Breath and Rash    Crab causes SOB, Scallops cause rash.  Can eat Shrimp and lobster.   Acetaminophen Other (See Comments)    Effects blood sugars and insulin pump.   Cephalexin Nausea Only and Other (See Comments)    Headaches, dizziness   Pneumococcal Vaccines Swelling    Local injection swelling   Prednisone Nausea Only and Other (See Comments)    Headaches, dizziness, Prefers not to take because of uncontrollable BG's on this med;      Soy Allergy Other (See Comments)    Blood test   Adhesive [Tape] Rash    With extended exposure   Dust Mite Extract Rash    Peanut-Containing Drug Products Rash    I reviewed her past medical history, social history, family history, and environmental history and no significant changes have been reported from her previous visit.   Review of Systems  Constitutional:  Negative for appetite change, chills, fever and unexpected weight change.  HENT:  Negative for congestion and rhinorrhea.   Eyes:  Negative for itching.  Respiratory:  Negative for cough, chest tightness, shortness of breath and wheezing.   Cardiovascular:  Negative for chest pain.  Gastrointestinal:  Negative for abdominal pain.  Genitourinary:  Negative for difficulty urinating.  Skin:  Positive for rash.  Allergic/Immunologic: Positive for environmental allergies.  Neurological:  Negative for headaches.    Objective: There were no vitals taken for this visit. There is no height or weight on file to calculate BMI. Physical Exam Vitals and nursing note reviewed.  Constitutional:      Appearance: Normal appearance. She is well-developed.  HENT:     Head: Normocephalic and atraumatic.     Right Ear: Tympanic membrane  and external ear normal.     Left Ear: Tympanic membrane and external ear normal.     Nose: Nose normal.     Mouth/Throat:     Mouth: Mucous membranes are moist.     Pharynx: Oropharynx is clear.  Eyes:     Conjunctiva/sclera: Conjunctivae normal.  Cardiovascular:     Rate and Rhythm: Normal rate and regular rhythm.     Heart sounds: Normal heart sounds. No murmur heard. Pulmonary:     Effort: Pulmonary effort is normal.     Breath sounds: Normal breath sounds. No wheezing, rhonchi or rales.  Musculoskeletal:     Cervical back: Neck supple.  Skin:    General: Skin is warm.     Findings: No rash.  Neurological:     Mental Status: She is alert and oriented to person, place, and time.  Psychiatric:        Mood and Affect: Mood normal.        Behavior: Behavior normal.     Diagnostics: Spirometry:  Tracings  reviewed. Her effort: {Blank single:19197::"Good reproducible efforts.","It was hard to get consistent efforts and there is a question as to whether this reflects a maximal maneuver.","Poor effort, data can not be interpreted."} FVC: ***L FEV1: ***L, ***% predicted FEV1/FVC ratio: ***% Interpretation: {Blank single:19197::"Spirometry consistent with mild obstructive disease","Spirometry consistent with moderate obstructive disease","Spirometry consistent with severe obstructive disease","Spirometry consistent with possible restrictive disease","Spirometry consistent with mixed obstructive and restrictive disease","Spirometry uninterpretable due to technique","Spirometry consistent with normal pattern","No overt abnormalities noted given today's efforts"}.  Please see scanned spirometry results for details.  Skin Testing: {Blank single:19197::"None","Deferred due to recent antihistamines use"}. Positive test to: ***. Negative test to: ***.  Results discussed with patient/family.   Previous notes and tests were reviewed. The plan was reviewed with the patient/family, and all questions/concerned were addressed.  It was my pleasure to see Nicole Carrillo today and participate in her care. Please feel free to contact me with any questions or concerns.  Sincerely,  Wyline Mood, DO Allergy & Immunology  [facility] office: [phone] [facility] office: [phone]

## 2022-07-31 ENCOUNTER — Encounter: Payer: Self-pay | Admitting: Allergy

## 2022-07-31 ENCOUNTER — Ambulatory Visit: Payer: BC Managed Care – PPO | Admitting: Allergy

## 2022-07-31 VITALS — BP 122/80 | HR 76 | Temp 98.3°F | Resp 18

## 2022-07-31 DIAGNOSIS — T781XXA Other adverse food reactions, not elsewhere classified, initial encounter: Secondary | ICD-10-CM | POA: Diagnosis not present

## 2022-07-31 DIAGNOSIS — H1013 Acute atopic conjunctivitis, bilateral: Secondary | ICD-10-CM

## 2022-07-31 DIAGNOSIS — L503 Dermatographic urticaria: Secondary | ICD-10-CM

## 2022-07-31 DIAGNOSIS — J3089 Other allergic rhinitis: Secondary | ICD-10-CM

## 2022-07-31 DIAGNOSIS — J302 Other seasonal allergic rhinitis: Secondary | ICD-10-CM | POA: Diagnosis not present

## 2022-07-31 DIAGNOSIS — R0989 Other specified symptoms and signs involving the circulatory and respiratory systems: Secondary | ICD-10-CM

## 2022-07-31 DIAGNOSIS — J4540 Moderate persistent asthma, uncomplicated: Secondary | ICD-10-CM

## 2022-07-31 DIAGNOSIS — L509 Urticaria, unspecified: Secondary | ICD-10-CM

## 2022-07-31 DIAGNOSIS — T781XXD Other adverse food reactions, not elsewhere classified, subsequent encounter: Secondary | ICD-10-CM

## 2022-07-31 MED ORDER — ALBUTEROL SULFATE HFA 108 (90 BASE) MCG/ACT IN AERS: 2.0000 | INHALATION_SPRAY | RESPIRATORY_TRACT | 1 refills | Status: AC | PRN

## 2022-07-31 NOTE — Assessment & Plan Note (Signed)
Globus sensation but denies reflux/heartburn. . If this persists then recommend ENT evaluation next.

## 2022-07-31 NOTE — Assessment & Plan Note (Signed)
Past history - 2018 skin testing positive to soy, peanut and borderline to wheat. She had no prior clinical reactions to these foods. 2021 bloodwork borderline positive to hazelnut, peanut, macademia nut, pistachios and soy. Positive to sesame seed. Tolerates soy and wheat. Interim history - no reactions and tolerates peanuts and tree nuts.   Tolerated a total of of soy milk with no issues.   Will remove soy from allergy list.  . Continue to avoid shellfish, corn and sesame.  . Schedule for corn challenge next.

## 2022-07-31 NOTE — Assessment & Plan Note (Signed)
Past history - Diagnosed with asthma in 2018 by pulmonology via methacholine challenge test - data not available for review. Tried Arnuity, Breo with no benefit. No triggers noted. Normal EGD in past per patient report. 2021 spirometry was normal with marginal improvement in FEV1 post bronchodilator treatment. Clinically feeling about the same. Interim history - controlled but questioning whether she really has asthma or not.  . Daily controller medication(s): Breo 1 puff once a day and rinse mouth after each use.  o Continue Singulair (montelukast) 10mg  daily at night. . During upper respiratory infections/asthma flares:  o Start Breo 1 puff once a day for 1-2 weeks until your breathing symptoms return to baseline.  o Pretreat with albuterol 2 puffs. . May use albuterol rescue inhaler 2 puffs every 4 to 6 hours as needed for shortness of breath, chest tightness, coughing, and wheezing. May use albuterol rescue inhaler 2 puffs 5 to 15 minutes prior to strenuous physical activities. Monitor frequency of use.  . Get spirometry at next visit. Discussed that the gold standard for diagnosis is a methacholine challenge test which she had done. I do not recommend stopping the [facility] for now.

## 2022-07-31 NOTE — Patient Instructions (Addendum)
Do not eat challenge food for next 24 hours and monitor for hives, swelling, shortness of breath and dizziness. If you see these symptoms, use Benadryl for mild symptoms and epinephrine for more severe symptoms and call 911.  If no adverse symptoms in the next 24 hours, repeat the challenge food the next day and observe for 1 hour. If no adverse symptoms, can eat the food on regular basis.   Food Continue to avoid shellfish, corn and sesame. Schedule for corn challenge next. Bring fully cooked corn kernels.  Rash The next time you break out, take a picture and take benadryl 25mg  to 50mg  to see if it goes away faster. Take dye free benadryl.   Throat issue If this persists then recommend ENT evaluation next.  Asthma:  Daily controller medication(s): Breo 1 puff once a day and rinse mouth after each use.  Continue Singulair (montelukast) 10mg  daily at night. During upper respiratory infections/asthma flares:  Start Breo 1 puff once a day for 1-2 weeks until your breathing symptoms return to baseline.  Pretreat with albuterol 2 puffs. May use albuterol rescue inhaler 2 puffs every 4 to 6 hours as needed for shortness of breath, chest tightness, coughing, and wheezing. May use albuterol rescue inhaler 2 puffs 5 to 15 minutes prior to strenuous physical activities. Monitor frequency of use.  Asthma control goals:  Full participation in all desired activities (may need albuterol before activity) Albuterol use two times or less a week on average (not counting use with activity) Cough interfering with sleep two times or less a month Oral steroids no more than once a year No hospitalizations   Environmental allergies: 2021 bloodwork positive to dust mites, grass pollen, mold, tree pollen, ragweed pollen and weed pollen.  Continue Singulair (montelukast) 10mg  daily at night. Continue environmental control measures.   Follow up for corn challenge.

## 2022-07-31 NOTE — Assessment & Plan Note (Addendum)
Breaking out in random rashes with no triggers. Off antihistamines. . Based on clinical history she most likely has idiopathic chronic urticaria.  . Declines daily antihistamines. . The next time you break out, take a picture and take benadryl 25mg  to 50mg  to see if it goes away faster. o Take dye free benadryl - patient concerned about potential dye allergy.

## 2022-07-31 NOTE — Assessment & Plan Note (Signed)
Past history - Perennial rhino conjunctivitis symptoms for the past 3 years. 2018 skin testing by ENT was positive to grass, ragweed, weed, trees, molds, dust mites, cockroach, horse. SLIT therapy by ENT x few months with no benefit. Deviated septum repair and turbinate reduction in 2019. 2021 bloodwork positive to dust mites, grass pollen, mold, tree pollen, ragweed pollen and weed pollen. Dymista caused nausea.  Interim history - stable.  . Continue environmental control measures.  . Continue Singulair (montelukast) 10mg  daily at night. . Hold AIT until rash/hives improved and all food challenges completed.

## 2022-08-08 NOTE — Progress Notes (Unsigned)
Follow Up Note  RE: Nicole Carrillo MRN: [id number] DOB: June 13, 1986 Date of Office Visit: 08/09/2022  Referring provider: Natalia Leatherwood, DO Primary care provider: Natalia Leatherwood, DO  Chief Complaint:No chief complaint on file.   Assessment and Plan: Nicole Carrillo is a 36 y.o. female with: No problem-specific Assessment & Plan notes found for this encounter.  No follow-ups on file.  Challenge food: *** Challenge as per protocol: {Blank single:19197::"Passed","Failed"} Total time: ***  Do not eat challenge food for next 24 hours and monitor for hives, swelling, shortness of breath and dizziness. If you see these symptoms, use Benadryl for mild symptoms and epinephrine for more severe symptoms and call 911.  If no adverse symptoms in the next 24 hours, repeat the challenge food the next day and observe for 1 hour. If no adverse symptoms, can eat the food on regular basis.   History of Present Illness: I had the pleasure of seeing Nicole Carrillo for a follow up visit at the [facility] on 08/08/2022. She is a 36 y.o. female, who is being followed for adverse food reaction, asthma, urticaria, globus sensation, allergic rhino conjunctivitis AIT. Her previous allergy office visit was on 07/31/2022 with Dr. Selena Batten. Today she is here for corn food challenge.   History of Reaction: Other adverse food reactions, not elsewhere classified, subsequent encounter Past history - 2018 skin testing positive to soy, peanut and borderline to wheat. She had no prior clinical reactions to these foods. 2021 bloodwork borderline positive to hazelnut, peanut, macademia nut, pistachios and soy. Positive to sesame seed. Tolerates soy and wheat. Interim history - no reactions and tolerates peanuts and tree nuts.  Tolerated a total of of soy milk with no issues.  Will remove soy from allergy list.  Continue to avoid shellfish, corn and sesame.  Schedule for corn challenge next.   Moderate  persistent asthma without complication Past history - Diagnosed with asthma in 2018 by pulmonology via methacholine challenge test - data not available for review. Tried Arnuity, Breo with no benefit. No triggers noted. Normal EGD in past per patient report. 2021 spirometry was normal with marginal improvement in FEV1 post bronchodilator treatment. Clinically feeling about the same. Interim history - controlled but questioning whether she really has asthma or not.  Daily controller medication(s): Breo 1 puff once a day and rinse mouth after each use.  Continue Singulair (montelukast) 10mg  daily at night. During upper respiratory infections/asthma flares:  Start Breo 1 puff once a day for 1-2 weeks until your breathing symptoms return to baseline.  Pretreat with albuterol 2 puffs. May use albuterol rescue inhaler 2 puffs every 4 to 6 hours as needed for shortness of breath, chest tightness, coughing, and wheezing. May use albuterol rescue inhaler 2 puffs 5 to 15 minutes prior to strenuous physical activities. Monitor frequency of use.  Get spirometry at next visit. Discussed that the gold standard for diagnosis is a methacholine challenge test which she had done. I do not recommend stopping the [facility] for now.    Urticaria Breaking out in random rashes with no triggers. Off antihistamines. Based on clinical history she most likely has idiopathic chronic urticaria.  Declines daily antihistamines. The next time you break out, take a picture and take benadryl 25mg  to 50mg  to see if it goes away faster. Take dye free benadryl - patient concerned about potential dye allergy.   Globus sensation Globus sensation but denies reflux/heartburn. If this persists then recommend  ENT evaluation next.   Seasonal and perennial allergic rhinoconjunctivitis Past history - Perennial rhino conjunctivitis symptoms for the past 3 years. 2018 skin testing by ENT was positive to grass, ragweed, weed,  trees, molds, dust mites, cockroach, horse. SLIT therapy by ENT x few months with no benefit. Deviated septum repair and turbinate reduction in 2019. 2021 bloodwork positive to dust mites, grass pollen, mold, tree pollen, ragweed pollen and weed pollen. Dymista caused nausea.  Interim history - stable.  Continue environmental control measures.  Continue Singulair (montelukast) 10mg  daily at night. Hold AIT until rash/hives improved and all food challenges completed.  Labs/skin testing: *** Interval History: Patient has not been ill, she has not had any accidental exposures to the culprit food.   Recent/Current History: Pulmonary disease: {Blank single:19197::"yes","no"} Cardiac disease: {Blank single:19197::"yes","no"} Respiratory infection: {Blank single:19197::"yes","no"} Rash: {Blank single:19197::"yes","no"} Itch: {Blank single:19197::"yes","no"} Swelling: {Blank single:19197::"yes","no"} Cough: {Blank single:19197::"yes","no"} Shortness of breath: {Blank single:19197::"yes","no"} Runny/stuffy nose: {Blank single:19197::"yes","no"} Itchy eyes: {Blank single:19197::"yes","no"} Beta-blocker use: {Blank single:19197::"yes","no"}  Patient/guardian was informed of the test procedure with verbalized understanding of the risk of anaphylaxis. Consent was signed.   Last antihistamine use: *** Last beta-blocker use: ***  Medication List:  Current Outpatient Medications  Medication Sig Dispense Refill   albuterol (VENTOLIN HFA) 108 (90 Base) MCG/ACT inhaler Inhale 2 puffs into the lungs every 4 (four) hours as needed for wheezing or shortness of breath. 18 g 1   diphenhydrAMINE (BENADRYL) 25 mg capsule Take by mouth.     EPINEPHrine (AUVI-Q) 0.3 mg/0.3 mL IJ SOAJ injection Inject 0.3 mg into the muscle as needed for anaphylaxis. 1 each 2   fluticasone furoate-vilanterol (BREO ELLIPTA) 100-25 MCG/ACT AEPB INHALE 1 PUFF INTO THE LUNGS DAILY. RINSE MOUTH AFTER USE 180 each 1   Glucagon 1  MG/0.2ML SOAJ Inject 1 mg into the skin as directed. 0.2 mL 1   Microlet Lancets MISC      montelukast (SINGULAIR) 10 MG tablet Take 1 tablet (10 mg total) by mouth daily. 90 tablet 0   NON FORMULARY Continuous glucose monitor     NOVOLOG 100 UNIT/ML injection SMARTSIG:0-40 Unit(s) SUB-Q Daily     Probiotic Product (PROBIOTIC DAILY PO) Take by mouth.     No current facility-administered medications for this visit.    Allergies: Allergies  Allergen Reactions   Molds & Smuts Itching and Shortness Of Breath   Shellfish Allergy Shortness Of Breath and Rash    Crab causes SOB, Scallops cause rash.  Can eat Shrimp and lobster.   Acetaminophen Other (See Comments)    Effects blood sugars and insulin pump.   Cephalexin Nausea Only and Other (See Comments)    Headaches, dizziness   Pneumococcal Vaccines Swelling    Local injection swelling   Prednisone Nausea Only and Other (See Comments)    Headaches, dizziness, Prefers not to take because of uncontrollable BG's on this med;      Adhesive [Tape] Rash    With extended exposure   Dust Mite Extract Rash    I reviewed her past medical history, social history, family history, and environmental history and no significant changes have been reported from her previous visit.   Review of Systems  Constitutional:  Negative for appetite change, chills, fever and unexpected weight change.  HENT:  Negative for congestion and rhinorrhea.   Eyes:  Negative for itching.  Respiratory:  Negative for cough, chest tightness, shortness of breath and wheezing.   Cardiovascular:  Negative for chest pain.  Gastrointestinal:  Negative for abdominal pain.  Genitourinary:  Negative for difficulty urinating.  Skin:  Positive for rash.  Allergic/Immunologic: Positive for environmental allergies.  Neurological:  Negative for headaches.    Objective: There were no vitals taken for this visit. There is no height or weight on file to calculate BMI. Physical  Exam Vitals and nursing note reviewed.  Constitutional:      Appearance: Normal appearance. She is well-developed.  HENT:     Head: Normocephalic and atraumatic.     Right Ear: Tympanic membrane and external ear normal.     Left Ear: Tympanic membrane and external ear normal.     Nose: Nose normal.     Mouth/Throat:     Mouth: Mucous membranes are moist.     Pharynx: Oropharynx is clear.  Eyes:     Conjunctiva/sclera: Conjunctivae normal.  Cardiovascular:     Rate and Rhythm: Normal rate and regular rhythm.     Heart sounds: Normal heart sounds. No murmur heard. Pulmonary:     Effort: Pulmonary effort is normal.     Breath sounds: Normal breath sounds. No wheezing, rhonchi or rales.  Musculoskeletal:     Cervical back: Neck supple.  Skin:    General: Skin is warm.     Findings: No rash.  Neurological:     Mental Status: She is alert and oriented to person, place, and time.  Psychiatric:        Mood and Affect: Mood normal.        Behavior: Behavior normal.     Diagnostics: Spirometry:  Tracings reviewed. Her effort: {Blank single:19197::"Good reproducible efforts.","It was hard to get consistent efforts and there is a question as to whether this reflects a maximal maneuver.","Poor effort, data can not be interpreted."} FVC: ***L FEV1: ***L, ***% predicted FEV1/FVC ratio: ***% Interpretation: {Blank single:19197::"Spirometry consistent with mild obstructive disease","Spirometry consistent with moderate obstructive disease","Spirometry consistent with severe obstructive disease","Spirometry consistent with possible restrictive disease","Spirometry consistent with mixed obstructive and restrictive disease","Spirometry uninterpretable due to technique","Spirometry consistent with normal pattern","No overt abnormalities noted given today's efforts"}.  Please see scanned spirometry results for details.  Skin Testing: {Blank single:19197::"None","Deferred due to recent  antihistamines use"}. Positive test to: ***. Negative test to: ***.  Results discussed with patient/family.   Previous notes and tests were reviewed. The plan was reviewed with the patient/family, and all questions/concerned were addressed.  It was my pleasure to see Deseree today and participate in her care. Please feel free to contact me with any questions or concerns.  Sincerely,  Wyline Mood, DO Allergy & Immunology  [facility] office: [phone] [facility] office: [phone]

## 2022-08-09 ENCOUNTER — Ambulatory Visit: Payer: BC Managed Care – PPO | Admitting: Allergy

## 2022-08-09 ENCOUNTER — Encounter: Payer: Self-pay | Admitting: Allergy

## 2022-08-09 VITALS — BP 122/70 | HR 68 | Temp 98.3°F | Resp 18

## 2022-08-09 DIAGNOSIS — T781XXD Other adverse food reactions, not elsewhere classified, subsequent encounter: Secondary | ICD-10-CM | POA: Diagnosis not present

## 2022-08-09 DIAGNOSIS — R0989 Other specified symptoms and signs involving the circulatory and respiratory systems: Secondary | ICD-10-CM

## 2022-08-09 DIAGNOSIS — H1010 Acute atopic conjunctivitis, unspecified eye: Secondary | ICD-10-CM

## 2022-08-09 DIAGNOSIS — L509 Urticaria, unspecified: Secondary | ICD-10-CM

## 2022-08-09 DIAGNOSIS — J4540 Moderate persistent asthma, uncomplicated: Secondary | ICD-10-CM

## 2022-08-09 NOTE — Assessment & Plan Note (Signed)
Past history - Diagnosed with asthma in 2018 by pulmonology via methacholine challenge test - data not available for review. Tried Arnuity, Breo with no benefit. No triggers noted. Normal EGD in past per patient report. 2021 spirometry was normal with marginal improvement in FEV1 post bronchodilator treatment. Clinically feeling about the same. . Daily controller medication(s): Breo 1 puff once a day and rinse mouth after each use.  o Continue Singulair (montelukast) 10mg  daily at night. . During upper respiratory infections/asthma flares:  o Start Breo 1 puff once a day for 1-2 weeks until your breathing symptoms return to baseline.  o Pretreat with albuterol 2 puffs. . May use albuterol rescue inhaler 2 puffs every 4 to 6 hours as needed for shortness of breath, chest tightness, coughing, and wheezing. May use albuterol rescue inhaler 2 puffs 5 to 15 minutes prior to strenuous physical activities. Monitor frequency of use.  . Get spirometry at next visit. Discussed that the gold standard for diagnosis is a methacholine challenge test which she had done. I do not recommend stopping the [facility] for now.

## 2022-08-09 NOTE — Assessment & Plan Note (Signed)
Past history - 2018 skin testing positive to soy, peanut and borderline to wheat. She had no prior clinical reactions to these foods. 2021 bloodwork borderline positive to hazelnut, peanut, macademia nut, pistachios and soy. Positive to sesame seed. Tolerates soy, wheat, peanuts, tree nuts now. Interim history -   Tolerated a total of 80g of corn with no issues.  . Continue to avoid shellfish and sesame. . Schedule for shellfish challenge next. o Bring fully cooked crab - 1 serving size.

## 2022-08-09 NOTE — Progress Notes (Signed)
Please place referral to ent for globus sensation per Dr Selena Batten thank you!

## 2022-08-09 NOTE — Assessment & Plan Note (Signed)
.   The next time you break out, take a picture and take benadryl 25mg  to 50mg  to see if it goes away faster. o Take dye free benadryl.

## 2022-08-09 NOTE — Assessment & Plan Note (Signed)
Past history - Perennial rhino conjunctivitis symptoms for the past 3 years. 2018 skin testing by ENT was positive to grass, ragweed, weed, trees, molds, dust mites, cockroach, horse. SLIT therapy by ENT x few months with no benefit. Deviated septum repair and turbinate reduction in 2019. 2021 bloodwork positive to dust mites, grass pollen, mold, tree pollen, ragweed pollen and weed pollen. Dymista caused nausea.  Continue environmental control measures.  Continue Singulair (montelukast) 10mg daily at night. Hold AIT until rash/hives improved and all food challenges completed. 

## 2022-08-09 NOTE — Patient Instructions (Addendum)
Do not eat challenge food for next 24 hours and monitor for hives, swelling, shortness of breath and dizziness. If you see these symptoms, use Benadryl for mild symptoms and epinephrine for more severe symptoms and call 911.  If no adverse symptoms in the next 24 hours, repeat the challenge food the next day and observe for 1 hour. If no adverse symptoms, can eat the food on regular basis.   Food Continue to avoid shellfish and sesame. Schedule for shellfish challenge next. Bring fully cooked crab - 1 serving size.   Rash The next time you break out, take a picture and take benadryl 25mg  to 50mg  to see if it goes away faster. Take dye free benadryl.   Throat issue Recommend ENT evaluation next [facility].  Asthma:  Daily controller medication(s): Breo 1 puff once a day and rinse mouth after each use.  Continue Singulair (montelukast) 10mg  daily at night. During upper respiratory infections/asthma flares:  Start Breo [facility] 1 puff once a day for 1-2 weeks until your breathing symptoms return to baseline.  Pretreat with albuterol 2 puffs. May use albuterol rescue inhaler 2 puffs every 4 to 6 hours as needed for shortness of breath, chest tightness, coughing, and wheezing. May use albuterol rescue inhaler 2 puffs 5 to 15 minutes prior to strenuous physical activities. Monitor frequency of use.  Asthma control goals:  Full participation in all desired activities (may need albuterol before activity) Albuterol use two times or less a week on average (not counting use with activity) Cough interfering with sleep two times or less a month Oral steroids no more than once a year No hospitalizations   Environmental allergies: 2021 bloodwork positive to dust mites, grass pollen, mold, tree pollen, ragweed pollen and weed pollen.  Continue Singulair (montelukast) 10mg  daily at night. Continue environmental control measures.   Follow up for crab challenge.

## 2022-08-09 NOTE — Assessment & Plan Note (Signed)
.   Recommend ENT evaluation next [facility] ENT.

## 2022-08-28 ENCOUNTER — Encounter: Payer: BC Managed Care – PPO | Admitting: Allergy

## 2022-08-29 DIAGNOSIS — K219 Gastro-esophageal reflux disease without esophagitis: Secondary | ICD-10-CM | POA: Insufficient documentation

## 2022-08-30 ENCOUNTER — Encounter: Payer: Self-pay | Admitting: Cardiology

## 2022-08-30 ENCOUNTER — Ambulatory Visit: Payer: BC Managed Care – PPO | Attending: Cardiology | Admitting: Cardiology

## 2022-08-30 VITALS — Ht 62.0 in | Wt 100.0 lb

## 2022-08-30 DIAGNOSIS — O1490 Unspecified pre-eclampsia, unspecified trimester: Secondary | ICD-10-CM

## 2022-08-30 DIAGNOSIS — O139 Gestational [pregnancy-induced] hypertension without significant proteinuria, unspecified trimester: Secondary | ICD-10-CM | POA: Diagnosis not present

## 2022-08-30 NOTE — Patient Instructions (Signed)
Medication Instructions:  Your physician recommends that you continue on your current medications as directed. Please refer to the Current Medication list given to you today.  *If you need a refill on your cardiac medications before your next appointment, please call your pharmacy*   Lab Work: None   Testing/Procedures: None   Follow-Up: At [facility], you and your health needs are our priority.  As part of our continuing mission to provide you with exceptional heart care, we have created designated Provider Care Teams.  These Care Teams include your primary Cardiologist (physician) and Advanced Practice Providers (APPs -  Physician Assistants and Nurse Practitioners) who all work together to provide you with the care you need, when you need it.  We recommend signing up for the patient portal called "MyChart".  Sign up information is provided on this After Visit Summary.  MyChart is used to connect with patients for Virtual Visits (Telemedicine).  Patients are able to view lab/test results, encounter notes, upcoming appointments, etc.  Non-urgent messages can be sent to your provider as well.   To learn more about what you can do with MyChart, go to [email or web address].    Your next appointment:   1 year(s)  The format for your next appointment:   In Person  Provider:   Kardie Tobb, DO     Other Instructions   Important Information About Sugar       

## 2022-09-02 NOTE — Progress Notes (Signed)
[facility]  Follow Up Note   Date:  09/02/2022   ID:  Nicole Carrillo, DOB 10/29/1986, MRN [id number]  PCP:  Natalia Leatherwood, DO   [facility] Providers Cardiologist:  Thomasene Ripple, DO  Electrophysiologist:  None        Referring MD: Natalia Leatherwood, DO   Chief Complaint: ": I am doing fine"  The patient is at home. I am in the office.   Virtual Visit via Video  Note . I connected with the patient today by a   video enabled telemedicine application and verified that I am speaking with the correct person using two identifiers.  History of Present Illness:    Nicole Carrillo is a 36 y.o. female [G2P2002] who returns for follow up postpartum visit.  Her medical history includes  asthma, preeclampsia, postpartum hypertension, hyperlipidemia, Hashimoto's thyroiditis and type 1 diabetes diagnosed at 36 years old.  I saw the patient on December 15, 2021 for her immediate postpartum visit.  She delivered her baby successfully.  She is doing well.  Since I saw her she has not had any issues with increasing blood pressure.  She offers no complaints at this time.  Prior CV Studies Reviewed: The following studies were reviewed today: Stress echo IMPRESSIONS    1. This is a negative stress echocardiogram for ischemia.   2. Excellent exercise capacity (11:47 min:s; 13.4 METS). Normal BP/HR  response to exercise.   3. This is a low risk study.   FINDINGS   Exam Protocol: The patient exercised on a treadmill according to a Bruce  protocol.     Patient Performance: The patient exercised for 11 minutes and 47 seconds,  achieving 13.4 METS. The maximum stage achieved was V of the Bruce  protocol. The baseline heart rate was 103 bpm. The heart rate at peak  stress was 187 bpm. The target heart rate  was calculated to be 158 bpm. The percentage of maximum predicted heart  rate achieved was 100.8 %. The baseline blood pressure was 152/90 mmHg.  The blood pressure at  peak stress was 211/74 mmHg. The blood pressure  response was normal. The patient  developed fatigue during the stress exam. The patient's functional  capacity was excellent.     EKG: Resting EKG showed normal sinus rhythm with no abnormal findings. The  patient developed no abnormal EKG findings during exercise.      Zio monitor The patient wore the monitor for 6 days 22 hours starting September 12, 2020. Indication: Palpitations.   The minimum heart rate was 53 bpm, maximum heart rate was 162  bpm, and average heart rate was 85 bpm. Predominant underlying rhythm was Sinus Rhythm.     Premature atrial complexes were rare less than 1%. Premature Ventricular complexes rare less than 1%.   No ventricular tachycardia, no pauses, No AV block, no supraventricular tachycardia and no atrial fibrillation present. 4 patient triggered events and 2 diary events noted: 1 associated with premature atrial complex, the remaining associated with sinus rhythm and sinus tachycardia.   Conclusion: Unremarkable study with no evidence of significant arrhythmia.  Past Medical History:  Diagnosis Date   Adverse food reaction 04/28/2020   Allergy    Angio-edema    Anxiety    Asthma    Chicken pox    Constipation    Diabetes mellitus type 1 (HCC)    Dysphagia    Dysthymia    Eczema    Elevated platelet count  Fetal CPAM    Folliculitis    GERD (gastroesophageal reflux disease)    Hashimoto's thyroiditis    History of migraine    Hyperlipidemia    Insulin pump in place    Left breast mass 01/14/2017   fibroadenoma- benign   OCD (obsessive compulsive disorder)    Orthodontics    permanent upper and lower retainers   Preeclampsia 02/24/2020   PVD (posterior vitreous detachment), right    Rectal bleeding    Rectal fissure    Tinnitus    Urticaria    Wears contact lenses     Past Surgical History:  Procedure Laterality Date   CESAREAN SECTION  11/26/2021   DENTAL SURGERY     ENDOSCOPIC  CONCHA BULLOSA RESECTION Bilateral 07/24/2018   Procedure: ENDOSCOPIC CONCHA BULLOSA MIDDLE TURBINATE;  Surgeon: Vernie Murders, MD;  Location: [facility];  Service: ENT;  Laterality: Bilateral;  Diabetic - insulin pump   SEPTOPLASTY N/A 07/24/2018   Procedure: SEPTOPLASTY;  Surgeon: Vernie Murders, MD;  Location: [facility];  Service: ENT;  Laterality: N/A;  NEEDS TO BE FIRST INSULIN PUMP   TURBINATE REDUCTION Bilateral 07/24/2018   Procedure: INFERIOR TURBINATE REDUCTION;  Surgeon: Vernie Murders, MD;  Location: [facility];  Service: ENT;  Laterality: Bilateral;   WISDOM TOOTH EXTRACTION          Current Medications: Current Meds  Medication Sig   albuterol (VENTOLIN HFA) 108 (90 Base) MCG/ACT inhaler Inhale 2 puffs into the lungs every 4 (four) hours as needed for wheezing or shortness of breath.   diphenhydrAMINE (BENADRYL) 25 mg capsule Take by mouth.   EPINEPHrine (AUVI-Q) 0.3 mg/0.3 mL IJ SOAJ injection Inject 0.3 mg into the muscle as needed for anaphylaxis.   fluticasone furoate-vilanterol (BREO ELLIPTA) 100-25 MCG/ACT AEPB INHALE 1 PUFF INTO THE LUNGS DAILY. RINSE MOUTH AFTER USE   Glucagon 1 MG/0.2ML SOAJ Inject 1 mg into the skin as directed.   Microlet Lancets MISC    montelukast (SINGULAIR) 10 MG tablet Take 1 tablet (10 mg total) by mouth daily.   NON FORMULARY Continuous glucose monitor   NOVOLOG 100 UNIT/ML injection SMARTSIG:0-40 Unit(s) SUB-Q Daily   Probiotic Product (PROBIOTIC DAILY PO) Take by mouth.     Allergies:   Molds & smuts, Shellfish allergy, Acetaminophen, Cephalexin, Pneumococcal vaccines, Prednisone, Adhesive [tape], and Dust mite extract   Social History   Socioeconomic History   Marital status: Married    Spouse name: Not on file   Number of children: Not on file   Years of education: Not on file   Highest education level: Not on file  Occupational History   Not on file  Tobacco Use   Smoking status: Never   Smokeless  tobacco: Never  Vaping Use   Vaping Use: Never used  Substance and Sexual Activity   Alcohol use: Not Currently    Alcohol/week: 1.0 standard drink of alcohol    Types: 1 Glasses of wine per week    Comment: occ   Drug use: Not Currently   Sexual activity: Yes    Partners: Male    Birth control/protection: None  Other Topics Concern   Not on file  Social History Narrative   Marital status/children/pets: Married. Soon to have 1 son.    Education/employment: MSN, Nurse.    Safety:      -smoke alarm in the home:Yes     - wears seatbelt: Yes     - Feels safe in their relationships: Yes  Social Determinants of Health   Financial Resource Strain: Not on file  Food Insecurity: Unknown (04/24/2021)   Hunger Vital Sign    Worried About Running Out of Food in the Last Year: Never true    Ran Out of Food in the Last Year: Not on file  Transportation Needs: No Transportation Needs (11/23/2019)   PRAPARE - Hydrologist (Medical): No    Lack of Transportation (Non-Medical): No  Physical Activity: Not on file  Stress: Not on file  Social Connections: Not on file      Family History  Problem Relation Age of Onset   Arthritis Mother    Stroke Mother    Miscarriages / [place] Mother    Arthritis Father    Dementia Maternal Grandmother    Emphysema Maternal Grandfather       ROS:   Please see the history of present illness.     All other systems reviewed and are negative.   Labs/EKG Reviewed:    EKG:   EKG is was ordered today.    Recent Labs: 01/01/2022: ALT 16; BUN 13; Creatinine, Ser 0.74; Potassium 4.0; Sodium 143; TSH 0.529 01/23/2022: Hemoglobin 13.8; Platelets 431   Recent Lipid Panel Lab Results  Component Value Date/Time   CHOL 160 05/16/2021 01:32 PM   TRIG 109.0 05/16/2021 01:32 PM   HDL 108.20 05/16/2021 01:32 PM   CHOLHDL 1 05/16/2021 01:32 PM   LDLCALC 30 05/16/2021 01:32 PM    Physical Exam:    VS:  Ht 5\' 2"  (1.575  m)   Wt 100 lb (45.4 kg)   BMI 18.29 kg/m     Wt Readings from Last 3 Encounters:  08/30/22 100 lb (45.4 kg)  06/05/22 101 lb 8 oz (46 kg)  04/02/22 109 lb (49.4 kg)     GEN:  Well nourished, well developed in no acute distress HEENT: Normal NECK: No JVD; No carotid bruits LYMPHATICS: No lymphadenopathy CARDIAC: RRR, no murmurs, rubs, gallops RESPIRATORY:  Clear to auscultation without rales, wheezing or rhonchi  ABDOMEN: Soft, non-tender, non-distended MUSCULOSKELETAL:  No edema; No deformity  SKIN: Warm and dry NEUROLOGIC:  Alert and oriented x 3 PSYCHIATRIC:  Normal affect    Risk Assessment/Risk Calculators:          ASSESSMENT & PLAN:    History of Postpartum hypertension  History of Preclampsia Type 1 diabetes uses insulin pump High risk pregnancy Hyperlipidemia  Her blood pressure in the office today is acceptable.  No changes will be made.  She is currently not on any antihypertensive medication postpartum.  We talked about possibility of having another baby.  For now she is not planning on it.    Diabetes mellitus-this is being managed by her primary team.   Patient Instructions  Medication Instructions:  Your physician recommends that you continue on your current medications as directed. Please refer to the Current Medication list given to you today.  *If you need a refill on your cardiac medications before your next appointment, please call your pharmacy*   Lab Work: None   Testing/Procedures: None   Follow-Up: At [facility], you and your health needs are our priority.  As part of our continuing mission to provide you with exceptional heart care, we have created designated Provider Care Teams.  These Care Teams include your primary Cardiologist (physician) and Advanced Practice Providers (APPs -  Physician Assistants and Nurse Practitioners) who all work together to provide you with the care  you need, when you need it.  We recommend  signing up for the patient portal called "MyChart".  Sign up information is provided on this After Visit Summary.  MyChart is used to connect with patients for Virtual Visits (Telemedicine).  Patients are able to view lab/test results, encounter notes, upcoming appointments, etc.  Non-urgent messages can be sent to your provider as well.   To learn more about what you can do with MyChart, go to [email or web address].    Your next appointment:   1 year(s)  The format for your next appointment:   In Person  Provider:   Thomasene Ripple, DO     Other Instructions   Important Information About Sugar        Dispo:  Return in about 1 year (around 08/31/2023).   Medication Adjustments/Labs and Tests Ordered: Current medicines are reviewed at length with the patient today.  Concerns regarding medicines are outlined above.  Tests Ordered: No orders of the defined types were placed in this encounter.  Medication Changes: No orders of the defined types were placed in this encounter.

## 2022-09-06 ENCOUNTER — Ambulatory Visit: Payer: BC Managed Care – PPO | Admitting: Family Medicine

## 2022-09-06 VITALS — BP 122/73 | HR 86 | Temp 98.2°F | Ht 62.0 in | Wt 99.0 lb

## 2022-09-06 DIAGNOSIS — J029 Acute pharyngitis, unspecified: Secondary | ICD-10-CM | POA: Diagnosis not present

## 2022-09-06 DIAGNOSIS — J020 Streptococcal pharyngitis: Secondary | ICD-10-CM | POA: Diagnosis not present

## 2022-09-06 LAB — POC COVID19 BINAXNOW: SARS Coronavirus 2 Ag: NEGATIVE

## 2022-09-06 LAB — POCT RAPID STREP A (OFFICE): Rapid Strep A Screen: POSITIVE — AB

## 2022-09-06 LAB — POCT INFLUENZA A/B
Influenza A, POC: NEGATIVE
Influenza B, POC: NEGATIVE

## 2022-09-06 MED ORDER — FLUCONAZOLE 150 MG PO TABS: 150.0000 mg | ORAL_TABLET | Freq: Once | ORAL | 0 refills | Status: AC

## 2022-09-06 MED ORDER — AMOXICILLIN-POT CLAVULANATE 875-125 MG PO TABS: 1.0000 | ORAL_TABLET | Freq: Two times a day (BID) | ORAL | 0 refills | Status: DC

## 2022-09-06 NOTE — Progress Notes (Signed)
Nicole Carrillo , 14-Sep-1986, 36 y.o., female MRN: [id number] Patient Care Team    Relationship Specialty Notifications Start End  Natalia Leatherwood, DO PCP - General Family Medicine  02/22/20   Thomasene Ripple, DO PCP - Cardiology Cardiology  09/12/20   Ellamae Sia, DO Consulting Physician Allergy  04/28/20   Magda Kiel, MD  Obstetrics and Gynecology  03/07/21   Doristine Bosworth., MD  Endocrinology  04/02/22     Chief Complaint  Patient presents with   Sore Throat    Pt c/o sore throat and ear discomfort x 2 days ; was dx with URI 2 weeks ago but never completely resolved.      Subjective: Pt presents for an OV with complaints of sore throat and ear discomfort of 2 days duration.  She reports the family had an upper respiratory infection over the last 2 weeks, but she woke up with a sore throat over the last 2 days that does not seem to be correlated with the prior URI.  She denies fevers or chills.  She is a diabetic.     05/16/2021    1:16 PM 04/03/2021    1:09 PM 03/07/2021   11:01 AM 02/22/2020    3:30 PM 02/17/2020    9:55 AM  Depression screen PHQ 2/9  Decreased Interest 0 0 0 0 0  Down, Depressed, Hopeless 0 0 0 0 0  PHQ - 2 Score 0 0 0 0 0  Altered sleeping 0  0  0  Tired, decreased energy 0  0  0  Change in appetite 0  0  0  Feeling bad or failure about yourself  0  0  0  Trouble concentrating 0  0  0  Moving slowly or fidgety/restless 0  0  0  Suicidal thoughts 0  0  0  PHQ-9 Score 0  0  0  Difficult doing work/chores     Not difficult at all    Allergies  Allergen Reactions   Molds & Smuts Itching and Shortness Of Breath   Shellfish Allergy Shortness Of Breath and Rash    Crab causes SOB, Scallops cause rash.  Can eat Shrimp and lobster.   Acetaminophen Other (See Comments)    Effects blood sugars and insulin pump.   Cephalexin Nausea Only and Other (See Comments)    Headaches, dizziness   Pneumococcal Vaccines Swelling    Local injection swelling    Prednisone Nausea Only and Other (See Comments)    Headaches, dizziness, Prefers not to take because of uncontrollable BG's on this med;      Adhesive [Tape] Rash    With extended exposure   Dust Mite Extract Rash   Social History   Social History Narrative   Marital status/children/pets: Married. Soon to have 1 son.    Education/employment: MSN, Nurse.    Safety:      -smoke alarm in the home:Yes     - wears seatbelt: Yes     - Feels safe in their relationships: Yes   Past Medical History:  Diagnosis Date   Adverse food reaction 04/28/2020   Allergy    Angio-edema    Anxiety    Asthma    Chicken pox    Constipation    Diabetes mellitus type 1 (HCC)    Dysphagia    Dysthymia    Eczema    Elevated platelet count    Fetal CPAM  Folliculitis    GERD (gastroesophageal reflux disease)    Hashimoto's thyroiditis    History of migraine    Hyperlipidemia    Insulin pump in place    Left breast mass 01/14/2017   fibroadenoma- benign   OCD (obsessive compulsive disorder)    Orthodontics    permanent upper and lower retainers   Preeclampsia 02/24/2020   PVD (posterior vitreous detachment), right    Rectal bleeding    Rectal fissure    Tinnitus    Urticaria    Wears contact lenses    Past Surgical History:  Procedure Laterality Date   CESAREAN SECTION  11/26/2021   DENTAL SURGERY     ENDOSCOPIC CONCHA BULLOSA RESECTION Bilateral 07/24/2018   Procedure: ENDOSCOPIC CONCHA BULLOSA MIDDLE TURBINATE;  Surgeon: Vernie Murders, MD;  Location: [facility];  Service: ENT;  Laterality: Bilateral;  Diabetic - insulin pump   SEPTOPLASTY N/A 07/24/2018   Procedure: SEPTOPLASTY;  Surgeon: Vernie Murders, MD;  Location: [facility];  Service: ENT;  Laterality: N/A;  NEEDS TO BE FIRST INSULIN PUMP   TURBINATE REDUCTION Bilateral 07/24/2018   Procedure: INFERIOR TURBINATE REDUCTION;  Surgeon: Vernie Murders, MD;  Location: [facility];  Service: ENT;  Laterality:  Bilateral;   WISDOM TOOTH EXTRACTION     Family History  Problem Relation Age of Onset   Arthritis Mother    Stroke Mother    Miscarriages / [place] Mother    Arthritis Father    Dementia Maternal Grandmother    Emphysema Maternal Grandfather    Allergies as of 09/06/2022       Reactions   Molds & Smuts Itching, Shortness Of Breath   Shellfish Allergy Shortness Of Breath, Rash   Crab causes SOB, Scallops cause rash.  Can eat Shrimp and lobster.   Acetaminophen Other (See Comments)   Effects blood sugars and insulin pump.   Cephalexin Nausea Only, Other (See Comments)   Headaches, dizziness   Pneumococcal Vaccines Swelling   Local injection swelling   Prednisone Nausea Only, Other (See Comments)   Headaches, dizziness, Prefers not to take because of uncontrollable BG's on this med;    Adhesive [tape] Rash   With extended exposure   Dust Mite Extract Rash        Medication List        Accurate as of September 06, 2022  3:33 PM. If you have any questions, ask your nurse or doctor.          albuterol 108 (90 Base) MCG/ACT inhaler Commonly known as: VENTOLIN HFA Inhale 2 puffs into the lungs every 4 (four) hours as needed for wheezing or shortness of breath.   amoxicillin-clavulanate 875-125 MG tablet Commonly known as: AUGMENTIN Take 1 tablet by mouth 2 (two) times daily. Started by: Felix Pacini, DO   diphenhydrAMINE 25 mg capsule Commonly known as: BENADRYL Take by mouth.   EPINEPHrine 0.3 mg/0.3 mL Soaj injection Commonly known as: Auvi-Q Inject 0.3 mg into the muscle as needed for anaphylaxis.   fluconazole 150 MG tablet Commonly known as: DIFLUCAN Take 1 tablet (150 mg total) by mouth once for 1 dose. Started by: Felix Pacini, DO   fluticasone furoate-vilanterol 100-25 MCG/ACT Aepb Commonly known as: Breo Ellipta INHALE 1 PUFF INTO THE LUNGS DAILY. RINSE MOUTH AFTER USE   Glucagon 1 MG/0.2ML Soaj Inject 1 mg into the skin as directed.    Microlet Lancets Misc   montelukast 10 MG tablet Commonly known as: SINGULAIR Take 1 tablet (  10 mg total) by mouth daily.   NON FORMULARY Continuous glucose monitor   NovoLOG 100 UNIT/ML injection Generic drug: insulin aspart SMARTSIG:0-40 Unit(s) SUB-Q Daily   PROBIOTIC DAILY PO Take by mouth.        All past medical history, surgical history, allergies, family history, immunizations andmedications were updated in the EMR today and reviewed under the history and medication portions of their EMR.     Review of Systems  HENT:  Positive for congestion, ear pain and sore throat.   Eyes:  Negative for discharge and redness.  Respiratory:  Negative for cough.   Gastrointestinal:  Negative for abdominal pain, nausea and vomiting.  Neurological:  Negative for dizziness and headaches.   Negative, with the exception of above mentioned in HPI   Objective:  BP 122/73   Pulse 86   Temp 98.2 F (36.8 C) (Oral)   Ht 5\' 2"  (1.575 m)   Wt 99 lb (44.9 kg)   LMP 08/28/2022   SpO2 99%   BMI 18.11 kg/m  Body mass index is 18.11 kg/m. Physical Exam Vitals and nursing note reviewed.  Constitutional:      General: She is not in acute distress.    Appearance: Normal appearance. She is not ill-appearing, toxic-appearing or diaphoretic.  HENT:     Head: Normocephalic and atraumatic.     Right Ear: Tympanic membrane and ear canal normal.     Left Ear: Tympanic membrane and ear canal normal.     Nose: Congestion present. No rhinorrhea.     Mouth/Throat:     Mouth: Mucous membranes are moist.     Pharynx: Oropharyngeal exudate and posterior oropharyngeal erythema present.  Eyes:     General: No scleral icterus.       Right eye: No discharge.        Left eye: No discharge.     Extraocular Movements: Extraocular movements intact.     Conjunctiva/sclera: Conjunctivae normal.     Pupils: Pupils are equal, round, and reactive to light.  Cardiovascular:     Rate and Rhythm: Normal  rate and regular rhythm.  Pulmonary:     Effort: Pulmonary effort is normal. No respiratory distress.     Breath sounds: Normal breath sounds. No wheezing, rhonchi or rales.  Musculoskeletal:     Cervical back: Neck supple. No tenderness.  Lymphadenopathy:     Cervical: Cervical adenopathy present.  Skin:    General: Skin is warm and dry.     Coloration: Skin is not jaundiced or pale.     Findings: No erythema or rash.  Neurological:     Mental Status: She is alert and oriented to person, place, and time. Mental status is at baseline.     Motor: No weakness.     Gait: Gait normal.  Psychiatric:        Mood and Affect: Mood normal.        Behavior: Behavior normal.        Thought Content: Thought content normal.        Judgment: Judgment normal.     No results found. No results found. Results for orders placed or performed in visit on 09/06/22 (from the past 24 hour(s))  POCT rapid strep A     Status: Abnormal   Collection Time: 09/06/22 11:17 AM  Result Value Ref Range   Rapid Strep A Screen Positive (A) Negative  POC COVID-19 BinaxNow     Status: Normal   Collection Time: 09/06/22  3:33 PM  Result Value Ref Range   SARS Coronavirus 2 Ag Negative Negative  POCT Influenza A/B     Status: Normal   Collection Time: 09/06/22  3:33 PM  Result Value Ref Range   Influenza A, POC Negative Negative   Influenza B, POC Negative Negative    Assessment/Plan: Jakalyn Kratky is a 36 y.o. female present for OV for  Sore throat/Strep pharyngitis - POC COVID-19 BinaxNow>negative - POCT rapid strep A>positive - POCT Influenza A/B>negative Rest, hydrate.  Salt water gargle Augmentin prescribed, take until completed.  Follow-up in 2 weeks if not seeing improvement, sooner if needed.  Reviewed expectations re: course of current medical issues. Discussed self-management of symptoms. Outlined signs and symptoms indicating need for more acute intervention. Patient verbalized  understanding and all questions were answered. Patient received an After-Visit Summary.    Orders Placed This Encounter  Procedures   POC COVID-19 BinaxNow   POCT rapid strep A   POCT Influenza A/B   Meds ordered this encounter  Medications   amoxicillin-clavulanate (AUGMENTIN) 875-125 MG tablet    Sig: Take 1 tablet by mouth 2 (two) times daily.    Dispense:  20 tablet    Refill:  0   fluconazole (DIFLUCAN) 150 MG tablet    Sig: Take 1 tablet (150 mg total) by mouth once for 1 dose.    Dispense:  1 tablet    Refill:  0   Referral Orders  No referral(s) requested today     Note is dictated utilizing voice recognition software. Although note has been proof read prior to signing, occasional typographical errors still can be missed. If any questions arise, please do not hesitate to call for verification.   electronically signed by:  Felix Pacini, DO  [facility] [place]

## 2022-09-06 NOTE — Patient Instructions (Signed)
Strep Throat, Adult Strep throat is an infection of the throat. It is caused by germs (bacteria). Strep throat is common during the cold months of the year. It mostly affects children who are 5-36 years old. However, people of all ages can get it at any time of the year. This infection spreads from person to person through coughing, sneezing, or having close contact. What are the causes? This condition is caused by the Streptococcus pyogenes germ. What increases the risk? You care for young children. Children are more likely to get strep throat and may spread it to others. You go to crowded places. Germs can spread easily in such places. You kiss or touch someone who has strep throat. What are the signs or symptoms? Fever or chills. Redness, swelling, or pain in the tonsils or throat. Pain or trouble when swallowing. White or yellow spots on the tonsils or throat. Tender glands in the neck and under the jaw. Bad breath. Red rash all over the body. This is rare. How is this treated? Medicines that kill germs (antibiotics). Medicines that treat pain or fever. These include: Ibuprofen or acetaminophen. Aspirin, only for people who are over the age of 18. Cough drops. Throat sprays. Follow these instructions at home: Medicines  Take over-the-counter and prescription medicines only as told by your doctor. Take your antibiotic medicine as told by your doctor. Do not stop taking the antibiotic even if you start to feel better. Eating and drinking  If you have trouble swallowing, eat soft foods until your throat feels better. Drink enough fluid to keep your pee (urine) pale yellow. To help with pain, you may have: Warm fluids, such as soup and tea. Cold fluids, such as frozen desserts or popsicles. General instructions Rinse your mouth (gargle) with a salt-water mixture 3-4 times a day or as needed. To make a salt-water mixture, dissolve -1 tsp (3-6 g) of salt in 1 cup (237 mL) of warm  water. Rest as much as you can. Stay home from work or school until you have been taking antibiotics for 24 hours. Do not smoke or use any products that contain nicotine or tobacco. If you need help quitting, ask your doctor. Keep all follow-up visits. How is this prevented?  Do not share food, drinking cups, or personal items. They can cause the germs to spread. Wash your hands well with soap and water. Make sure that all people in your house wash their hands well. Have family members tested if they have a fever or a sore throat. They may need an antibiotic if they have strep throat. Contact a doctor if: You have swelling in your neck that keeps getting bigger. You get a rash, cough, or earache. You cough up a thick fluid that is green, yellow-brown, or bloody. You have pain that does not get better with medicine. Your symptoms get worse instead of getting better. You have a fever. Get help right away if: You vomit. You have a very bad headache. Your neck hurts or feels stiff. You have chest pain or are short of breath. You have drooling, very bad throat pain, or changes in your voice. Your neck is swollen, or the skin gets red and tender. Your mouth is dry, or you are peeing less than normal. You keep feeling more tired or have trouble waking up. Your joints are red or painful. These symptoms may be an emergency. Do not wait to see if the symptoms will go away. Get help right away. Call   your local emergency services (911 in the [place] Summary Strep throat is an infection of the throat. It is caused by germs (bacteria). This infection can spread from person to person through coughing, sneezing, or having close contact. Take your medicines, including antibiotics, as told by your doctor. Do not stop taking the antibiotic even if you start to feel better. To prevent the spread of germs, wash your hands well with soap and water. Have others do the same. Do not share food, drinking cups,  or personal items. Get help right away if you have a bad headache, chest pain, shortness of breath, a stiff or painful neck, or you vomit. This information is not intended to replace advice given to you by your health care provider. Make sure you discuss any questions you have with your health care provider. Document Revised: 03/21/2021 Document Reviewed: 03/21/2021 Elsevier Patient Education  2023 Elsevier Inc.  

## 2022-09-12 ENCOUNTER — Encounter: Payer: Self-pay | Admitting: Family Medicine

## 2022-09-12 MED ORDER — FLUCONAZOLE 150 MG PO TABS: 150.0000 mg | ORAL_TABLET | Freq: Once | ORAL | 0 refills | Status: AC

## 2022-09-12 NOTE — Telephone Encounter (Signed)
Call to take Diflucan today and repeat dose at the end of antibiotic.  I have called in additional prescription for her

## 2022-09-25 ENCOUNTER — Encounter: Payer: BC Managed Care – PPO | Admitting: Allergy

## 2022-10-05 ENCOUNTER — Encounter: Payer: BC Managed Care – PPO | Admitting: Family Medicine

## 2022-10-18 ENCOUNTER — Encounter: Payer: BC Managed Care – PPO | Admitting: Allergy

## 2022-11-06 ENCOUNTER — Encounter: Payer: BC Managed Care – PPO | Admitting: Allergy

## 2022-11-08 ENCOUNTER — Encounter: Payer: BC Managed Care – PPO | Admitting: Allergy

## 2022-11-09 ENCOUNTER — Other Ambulatory Visit: Payer: Self-pay | Admitting: Family Medicine

## 2022-12-05 NOTE — Progress Notes (Unsigned)
Follow Up Note  RE: Nicole Carrillo MRN: [id number] DOB: [birth year] Date of Office Visit: 12/06/2022  Referring provider: Ma Hillock, DO Primary care provider: Ma Hillock, DO  Chief Complaint:No chief complaint on file.   Assessment and Plan: Nicole Carrillo is a 36 y.o. female with: No problem-specific Assessment & Plan notes found for this encounter.  No follow-ups on file.  Challenge food: *** Challenge as per protocol: {Blank single:19197::"Passed","Failed"} Total time: ***  Do not eat challenge food for next 24 hours and monitor for hives, swelling, shortness of breath and dizziness. If you see these symptoms, use Benadryl for mild symptoms and epinephrine for more severe symptoms and call 911.  If no adverse symptoms in the next 24 hours, repeat the challenge food the next day and observe for 1 hour. If no adverse symptoms, can eat the food on regular basis.   History of Present Illness: I had the pleasure of seeing Nicole Carrillo for a follow up visit at the [facility] on 12/05/2022. She is a 36 y.o. female, who is being followed for food allergy, asthma, urticaria, allergic rhinoconjunctivitis. Her previous allergy office visit was on 08/09/2022 with Dr. Maudie Mercury. Today she is here for crab food challenge.   Shellfish skin testing?  History of Reaction: Other adverse food reactions, not elsewhere classified, subsequent encounter Past history - 2018 skin testing positive to soy, peanut and borderline to wheat. She had no prior clinical reactions to these foods. 2021 bloodwork borderline positive to hazelnut, peanut, macademia nut, pistachios and soy. Positive to sesame seed. Tolerates soy, wheat, peanuts, tree nuts now. Interim history -  Tolerated a total of 80g of corn with no issues.  Continue to avoid shellfish and sesame. Schedule for shellfish challenge next. Bring fully cooked crab - 1 serving size.    Moderate persistent asthma without  complication Past history - Diagnosed with asthma in 2018 by pulmonology via methacholine challenge test - data not available for review. Tried Arnuity, Breo with no benefit. No triggers noted. Normal EGD in past per patient report. 2021 spirometry was normal with marginal improvement in FEV1 post bronchodilator treatment. Clinically feeling about the same. Daily controller medication(s): Breo 131mcg 1 puff once a day and rinse mouth after each use.  Continue Singulair (montelukast) 10mg  daily at night. During upper respiratory infections/asthma flares:  Start Breo 277mcg 1 puff once a day for 1-2 weeks until your breathing symptoms return to baseline.  Pretreat with albuterol 2 puffs. May use albuterol rescue inhaler 2 puffs every 4 to 6 hours as needed for shortness of breath, chest tightness, coughing, and wheezing. May use albuterol rescue inhaler 2 puffs 5 to 15 minutes prior to strenuous physical activities. Monitor frequency of use.  Get spirometry at next visit. Discussed that the gold standard for diagnosis is a methacholine challenge test which she had done. I do not recommend stopping the [facility] for now.    Urticaria The next time you break out, take a picture and take benadryl 25mg  to 50mg  to see if it goes away faster. Take dye free benadryl.    Seasonal and perennial allergic rhinoconjunctivitis Past history - Perennial rhino conjunctivitis symptoms for the past 3 years. 2018 skin testing by ENT was positive to grass, ragweed, weed, trees, molds, dust mites, cockroach, horse. SLIT therapy by ENT x few months with no benefit. Deviated septum repair and turbinate reduction in 2019. 2021 bloodwork positive to dust mites, grass pollen, mold, tree pollen, ragweed  pollen and weed pollen. Dymista caused nausea.  Continue environmental control measures.  Continue Singulair (montelukast) 10mg  daily at night. Hold AIT until rash/hives improved and all food challenges completed.   Globus  sensation Recommend ENT evaluation next [facility].   Return for Food challenge.   Challenge food: con  Labs/skin testing: Component     Latest Ref Rng 01/01/2022  Soybean IgE     Class 0/I kU/L 0.26 !   Sesame Seed IgE     Class II kU/L 1.35 !   Egg White IgE     Class 0 kU/L <0.10   Peanut IgE     kU/L CANCELED   Milk IgE     Class 0 kU/L <0.10   Clam IgE     Class 0 kU/L <0.10   Shrimp IgE     Class 0 kU/L <0.10   Walnut IgE     kU/L CANCELED   Codfish IgE     Class 0 kU/L <0.10   Scallop IgE     Class 0 kU/L <0.10   Wheat IgE     Class 0/I kU/L 0.21 !   Allergen Corn, IgE     Class 0/I kU/L 0.19 !     Interval History: Patient has not been ill, she has not had any accidental exposures to the culprit food.   Recent/Current History: Pulmonary disease: {Blank single:19197::"yes","no"} Cardiac disease: {Blank single:19197::"yes","no"} Respiratory infection: {Blank single:19197::"yes","no"} Rash: {Blank single:19197::"yes","no"} Itch: {Blank single:19197::"yes","no"} Swelling: {Blank single:19197::"yes","no"} Cough: {Blank single:19197::"yes","no"} Shortness of breath: {Blank single:19197::"yes","no"} Runny/stuffy nose: {Blank single:19197::"yes","no"} Itchy eyes: {Blank single:19197::"yes","no"} Beta-blocker use: {Blank single:19197::"yes","no"}  Patient/guardian was informed of the test procedure with verbalized understanding of the risk of anaphylaxis. Consent was signed.   Last antihistamine use: *** Last beta-blocker use: ***  Medication List:  Current Outpatient Medications  Medication Sig Dispense Refill   albuterol (VENTOLIN HFA) 108 (90 Base) MCG/ACT inhaler Inhale 2 puffs into the lungs every 4 (four) hours as needed for wheezing or shortness of breath. 18 g 1   amoxicillin-clavulanate (AUGMENTIN) 875-125 MG tablet Take 1 tablet by mouth 2 (two) times daily. 20 tablet 0   diphenhydrAMINE (BENADRYL) 25 mg capsule Take by mouth.      EPINEPHrine (AUVI-Q) 0.3 mg/0.3 mL IJ SOAJ injection Inject 0.3 mg into the muscle as needed for anaphylaxis. 1 each 2   fluticasone furoate-vilanterol (BREO ELLIPTA) 100-25 MCG/ACT AEPB INHALE 1 PUFF INTO THE LUNGS DAILY. RINSE MOUTH AFTER USE 180 each 1   Glucagon 1 MG/0.2ML SOAJ Inject 1 mg into the skin as directed. 0.2 mL 1   Microlet Lancets MISC      montelukast (SINGULAIR) 10 MG tablet TAKE 1 TABLET BY MOUTH EVERY DAY 90 tablet 0   NON FORMULARY Continuous glucose monitor     NOVOLOG 100 UNIT/ML injection SMARTSIG:0-40 Unit(s) SUB-Q Daily     Probiotic Product (PROBIOTIC DAILY PO) Take by mouth.     No current facility-administered medications for this visit.    Allergies: Allergies  Allergen Reactions   Molds & Smuts Itching and Shortness Of Breath   Shellfish Allergy Shortness Of Breath and Rash    Crab causes SOB, Scallops cause rash.  Can eat Shrimp and lobster.   Acetaminophen Other (See Comments)    Effects blood sugars and insulin pump.   Cephalexin Nausea Only and Other (See Comments)    Headaches, dizziness   Pneumococcal Vaccines Swelling    Local injection swelling   Prednisone Nausea Only and  Other (See Comments)    Headaches, dizziness, Prefers not to take because of uncontrollable BG's on this med;      Adhesive [Tape] Rash    With extended exposure   Dust Mite Extract Rash    I reviewed her past medical history, social history, family history, and environmental history and no significant changes have been reported from her previous visit.   Review of Systems  Constitutional:  Negative for appetite change, chills, fever and unexpected weight change.  HENT:  Negative for congestion and rhinorrhea.   Eyes:  Negative for itching.  Respiratory:  Negative for cough, chest tightness, shortness of breath and wheezing.   Cardiovascular:  Negative for chest pain.  Gastrointestinal:  Negative for abdominal pain.  Genitourinary:  Negative for difficulty urinating.   Skin:  Negative for rash.  Allergic/Immunologic: Positive for environmental allergies.  Neurological:  Negative for headaches.    Objective: There were no vitals taken for this visit. There is no height or weight on file to calculate BMI. Physical Exam Vitals and nursing note reviewed.  Constitutional:      Appearance: Normal appearance. She is well-developed.  HENT:     Head: Normocephalic and atraumatic.     Right Ear: Tympanic membrane and external ear normal.     Left Ear: Tympanic membrane and external ear normal.     Nose: Nose normal.     Mouth/Throat:     Mouth: Mucous membranes are moist.     Pharynx: Oropharynx is clear.  Eyes:     Conjunctiva/sclera: Conjunctivae normal.  Cardiovascular:     Rate and Rhythm: Normal rate and regular rhythm.     Heart sounds: Normal heart sounds. No murmur heard. Pulmonary:     Effort: Pulmonary effort is normal.     Breath sounds: Normal breath sounds. No wheezing, rhonchi or rales.  Musculoskeletal:     Cervical back: Neck supple.  Skin:    General: Skin is warm.     Findings: No rash.  Neurological:     Mental Status: She is alert and oriented to person, place, and time.  Psychiatric:        Mood and Affect: Mood normal.        Behavior: Behavior normal.     Diagnostics: Spirometry:  Tracings reviewed. Her effort: {Blank single:19197::"Good reproducible efforts.","It was hard to get consistent efforts and there is a question as to whether this reflects a maximal maneuver.","Poor effort, data can not be interpreted."} FVC: ***L FEV1: ***L, ***% predicted FEV1/FVC ratio: ***% Interpretation: {Blank single:19197::"Spirometry consistent with mild obstructive disease","Spirometry consistent with moderate obstructive disease","Spirometry consistent with severe obstructive disease","Spirometry consistent with possible restrictive disease","Spirometry consistent with mixed obstructive and restrictive disease","Spirometry  uninterpretable due to technique","Spirometry consistent with normal pattern","No overt abnormalities noted given today's efforts"}.  Please see scanned spirometry results for details.  Skin Testing: {Blank single:19197::"None","Deferred due to recent antihistamines use"}. Positive test to: ***. Negative test to: ***.  Results discussed with patient/family.   Previous notes and tests were reviewed. The plan was reviewed with the patient/family, and all questions/concerned were addressed.  It was my pleasure to see Kashawn today and participate in her care. Please feel free to contact me with any questions or concerns.  Sincerely,  Rexene Alberts, DO Allergy & Immunology  [facility] office: [place] office: [phone]

## 2022-12-06 ENCOUNTER — Encounter: Payer: Self-pay | Admitting: Allergy

## 2022-12-06 ENCOUNTER — Other Ambulatory Visit: Payer: Self-pay | Admitting: Allergy

## 2022-12-06 ENCOUNTER — Ambulatory Visit (INDEPENDENT_AMBULATORY_CARE_PROVIDER_SITE_OTHER): Payer: BC Managed Care – PPO | Admitting: Allergy

## 2022-12-06 VITALS — BP 118/74 | HR 95 | Temp 97.4°F | Resp 18 | Ht 62.0 in | Wt 102.4 lb

## 2022-12-06 DIAGNOSIS — H1010 Acute atopic conjunctivitis, unspecified eye: Secondary | ICD-10-CM

## 2022-12-06 DIAGNOSIS — J4540 Moderate persistent asthma, uncomplicated: Secondary | ICD-10-CM | POA: Diagnosis not present

## 2022-12-06 DIAGNOSIS — H1013 Acute atopic conjunctivitis, bilateral: Secondary | ICD-10-CM

## 2022-12-06 DIAGNOSIS — T781XXD Other adverse food reactions, not elsewhere classified, subsequent encounter: Secondary | ICD-10-CM | POA: Diagnosis not present

## 2022-12-06 DIAGNOSIS — J302 Other seasonal allergic rhinitis: Secondary | ICD-10-CM

## 2022-12-06 DIAGNOSIS — L509 Urticaria, unspecified: Secondary | ICD-10-CM

## 2022-12-06 DIAGNOSIS — R09A2 Foreign body sensation, throat: Secondary | ICD-10-CM

## 2022-12-06 MED ORDER — MONTELUKAST SODIUM 10 MG PO TABS: 10.0000 mg | ORAL_TABLET | Freq: Every day | ORAL | 2 refills | Status: AC

## 2022-12-06 MED ORDER — ARNUITY ELLIPTA 100 MCG/ACT IN AEPB: 1.0000 | INHALATION_SPRAY | Freq: Every day | RESPIRATORY_TRACT | 2 refills | Status: AC

## 2022-12-06 NOTE — Patient Instructions (Addendum)
Do not eat challenge food for next 24 hours and monitor for hives, swelling, shortness of breath and dizziness. If you see these symptoms, use Benadryl for mild symptoms and epinephrine for more severe symptoms and call 911.  If no adverse symptoms in the next 24 hours, repeat the challenge food the next day and observe for 1 hour. If no adverse symptoms, can eat the food on regular basis.   Food Continue to avoid sesame. Get bloodwork We are ordering labs, so please allow 1-2 weeks for the results to come back. With the newly implemented Cures Act, the labs might be visible to you at the same time that they become visible to me. However, I will not address the results until all of the results are back, so please be patient.  In the meantime, continue recommendations in your patient instructions, including avoidance measures (if applicable), until you hear from me.  Rash Based on clinical history, she likely has chronic idiopathic urticaria. Discussed with patient, that urticaria is usually caused by release of histamine by cutaneous mast cells but sometimes it is non-histamine mediated. Explained that urticaria is not always associated with allergies. In most cases, the exact etiology for urticaria can not be established and it is considered idiopathic. Meanwhile start the following medications:  Use over the counter antihistamines such as Zyrtec (cetirizine), Claritin (loratadine), Allegra (fexofenadine), or Xyzal (levocetirizine) daily as needed.  May switch antihistamines every few months. Avoid the following potential triggers: alcohol, tight clothing, NSAIDs.   Dysphagia  Follow up with GI as scheduled.   Asthma:  Normal breathing test today.  Daily controller medication(s):  Start Arnuity 1 puff once a day and rinse mouth after each use. If you notice worsening symptoms then okay to go back to [facility] as before.  Stop Breo.  Continue Singulair (montelukast) 10mg  daily at  night. During upper respiratory infections/asthma flares:  Start Breo 1 puff once a day for 1-2 weeks until your breathing symptoms return to baseline.  Pretreat with albuterol 2 puffs. May use albuterol rescue inhaler 2 puffs every 4 to 6 hours as needed for shortness of breath, chest tightness, coughing, and wheezing. May use albuterol rescue inhaler 2 puffs 5 to 15 minutes prior to strenuous physical activities. Monitor frequency of use.  Asthma control goals:  Full participation in all desired activities (may need albuterol before activity) Albuterol use two times or less a week on average (not counting use with activity) Cough interfering with sleep two times or less a month Oral steroids no more than once a year No hospitalizations   Environmental allergies: 2021 bloodwork positive to dust mites, grass pollen, mold, tree pollen, ragweed pollen and weed pollen.  Continue Singulair (montelukast) 10mg  daily at night. Continue environmental control measures.   Follow up in 4 months or sooner if needed.

## 2022-12-06 NOTE — Assessment & Plan Note (Signed)
Past history - 2018 skin testing positive to soy, peanut and borderline to wheat. She had no prior clinical reactions to these foods. 2021 bloodwork borderline positive to hazelnut, peanut, macademia nut, pistachios and soy. Positive to sesame seed. Tolerates soy, wheat, peanuts, tree nuts, corn.  Interim history - no reactions. Today's skin prick testing negative to shellfish.  Slight globus sensation with lip rub and 1g crab dose. 1 g crab dose repeated with no worsening symptoms.  Tolerated a total of 58g of crab with no issues.  Removed shellfish from allergy list.  Continue to avoid sesame. Get bloodwork for sesame.  For mild symptoms you can take over the counter antihistamines such as Benadryl and monitor symptoms closely. If symptoms worsen or if you have severe symptoms including breathing issues, throat closure, significant swelling, whole body hives, severe diarrhea and vomiting, lightheadedness then seek immediate medical care.

## 2022-12-06 NOTE — Assessment & Plan Note (Signed)
Lately breaking out daily and resolves within a few hours. No triggers noted. Hesitant about taking daily antihistamines. Based on clinical history, she likely has chronic idiopathic urticaria. Discussed with patient, that urticaria is usually caused by release of histamine by cutaneous mast cells but sometimes it is non-histamine mediated. Explained that urticaria is not always associated with allergies. In most cases, the exact etiology for urticaria can not be established and it is considered idiopathic. Meanwhile start the following medications:  Use over the counter antihistamines such as Zyrtec (cetirizine), Claritin (loratadine), Allegra (fexofenadine), or Xyzal (levocetirizine) daily as needed.  May switch antihistamines every few months. Avoid the following potential triggers: alcohol, tight clothing, NSAIDs.

## 2022-12-06 NOTE — Assessment & Plan Note (Signed)
Past history - Diagnosed with asthma in 2018 by pulmonology via  Methacholine challenge test - data not available for review. Tried Arnuity, Breo with no benefit. No triggers noted. Normal EGD in past per patient report. 2021 spirometry was normal with marginal improvement in FEV1 post bronchodilator treatment. Clinically feeling about the same. Interim history - no issues.   Normal spirometry today. Daily controller medication(s):  Start Arnuity 1 puff once a day and rinse mouth after each use. If you notice worsening symptoms then okay to go back to [facility] as before.  Stop Breo.  Continue Singulair (montelukast) 10mg  daily at night. During upper respiratory infections/asthma flares:  Start Breo 1 puff once a day for 1-2 weeks until your breathing symptoms return to baseline.  Pretreat with albuterol 2 puffs. May use albuterol rescue inhaler 2 puffs every 4 to 6 hours as needed for shortness of breath, chest tightness, coughing, and wheezing. May use albuterol rescue inhaler 2 puffs 5 to 15 minutes prior to strenuous physical activities. Monitor frequency of use.  Get spirometry at next visit.

## 2022-12-06 NOTE — Assessment & Plan Note (Signed)
Normal work up by ENT and had normal EGD. Swallow study showed dysphagia? Scheduled for manometry test Follow up with GI as scheduled.

## 2022-12-06 NOTE — Assessment & Plan Note (Signed)
Past history - Perennial rhino conjunctivitis symptoms for the past 3 years. 2018 skin testing by ENT was positive to grass, ragweed, weed, trees, molds, dust mites, cockroach, horse. SLIT therapy by ENT x few months with no benefit. Deviated septum repair and turbinate reduction in 2019. 2021 bloodwork positive to dust mites, grass pollen, mold, tree pollen, ragweed pollen and weed pollen. Dymista caused nausea.  Continue environmental control measures.  Continue Singulair (montelukast) 10mg  daily at night. Hold AIT until rash/hives improved and all food challenges completed.

## 2022-12-11 ENCOUNTER — Other Ambulatory Visit: Payer: Self-pay

## 2022-12-11 MED ORDER — PULMICORT FLEXHALER 90 MCG/ACT IN AEPB: 2.0000 | INHALATION_SPRAY | Freq: Two times a day (BID) | RESPIRATORY_TRACT | 5 refills | Status: DC

## 2022-12-11 NOTE — Telephone Encounter (Signed)
Patient's Arnuity was not on formulary for her insurance plan. Per Dr. Maudie Mercury she was switched to Pulmicort 90 2 puffs twice a day. Medication is covered by her insurance per the pharmacist. I called the patient and left a message about the inhaler switch.

## 2022-12-11 NOTE — Telephone Encounter (Signed)
Patient was switched to Arnutity

## 2022-12-24 ENCOUNTER — Encounter: Payer: Self-pay | Admitting: Allergy

## 2022-12-24 ENCOUNTER — Other Ambulatory Visit: Payer: Self-pay | Admitting: Allergy

## 2022-12-24 NOTE — Telephone Encounter (Signed)
Called the pharmacy and they stated that the [place] is not covered by insurance and that the patient picked up the [facility] in November that had a 90 day supply. Need an alternative to Arnuity,

## 2022-12-24 NOTE — Telephone Encounter (Signed)
Please do PA for Arnuity.  Patient had weird side effects with budesonide steroid in the past.

## 2022-12-24 NOTE — Telephone Encounter (Signed)
Message sent to doctor for alternative.

## 2022-12-25 ENCOUNTER — Telehealth: Payer: Self-pay

## 2022-12-25 ENCOUNTER — Other Ambulatory Visit (HOSPITAL_COMMUNITY): Payer: Self-pay

## 2022-12-25 NOTE — Telephone Encounter (Signed)
PA request received via providers office for Arnuity Ellipta 100MCG/ACT aerosol powder  PA has been submitted via CMM to [employer] and is pending determination.   Key: [id number]

## 2022-12-26 ENCOUNTER — Other Ambulatory Visit (HOSPITAL_COMMUNITY): Payer: Self-pay

## 2022-12-27 NOTE — Telephone Encounter (Signed)
Patient Advocate Encounter  Prior Authorization for Arnuity Ellipta 100MCG/ACT aerosol powder has been approved through [employer].    KeyReubin Carrillo  Effective: 12-25-2022 to 12-25-2023

## 2023-01-16 ENCOUNTER — Encounter: Payer: Self-pay | Admitting: Allergy

## 2023-01-18 ENCOUNTER — Encounter: Payer: Self-pay | Admitting: Family Medicine

## 2023-01-18 ENCOUNTER — Other Ambulatory Visit: Payer: Self-pay | Admitting: Family Medicine

## 2023-01-18 ENCOUNTER — Ambulatory Visit (INDEPENDENT_AMBULATORY_CARE_PROVIDER_SITE_OTHER): Payer: BC Managed Care – PPO | Admitting: Family Medicine

## 2023-01-18 VITALS — BP 115/77 | HR 85 | Temp 98.2°F | Ht 62.0 in | Wt 99.6 lb

## 2023-01-18 DIAGNOSIS — E063 Autoimmune thyroiditis: Secondary | ICD-10-CM

## 2023-01-18 DIAGNOSIS — E1065 Type 1 diabetes mellitus with hyperglycemia: Secondary | ICD-10-CM

## 2023-01-18 DIAGNOSIS — Z23 Encounter for immunization: Secondary | ICD-10-CM | POA: Diagnosis not present

## 2023-01-18 DIAGNOSIS — Z13220 Encounter for screening for lipoid disorders: Secondary | ICD-10-CM | POA: Diagnosis not present

## 2023-01-18 DIAGNOSIS — Z0000 Encounter for general adult medical examination without abnormal findings: Secondary | ICD-10-CM

## 2023-01-18 LAB — CBC WITH DIFFERENTIAL/PLATELET
Basophils Absolute: 0 10*3/uL (ref 0.0–0.1)
Basophils Relative: 0.7 % (ref 0.0–3.0)
Eosinophils Absolute: 0 10*3/uL (ref 0.0–0.7)
Eosinophils Relative: 0.7 % (ref 0.0–5.0)
HCT: 35.6 % — ABNORMAL LOW (ref 36.0–46.0)
Hemoglobin: 12 g/dL (ref 12.0–15.0)
Lymphocytes Relative: 19.5 % (ref 12.0–46.0)
Lymphs Abs: 1.3 10*3/uL (ref 0.7–4.0)
MCHC: 33.7 g/dL (ref 30.0–36.0)
MCV: 89 fl (ref 78.0–100.0)
Monocytes Absolute: 0.4 10*3/uL (ref 0.1–1.0)
Monocytes Relative: 5.5 % (ref 3.0–12.0)
Neutro Abs: 4.7 10*3/uL (ref 1.4–7.7)
Neutrophils Relative %: 73.6 % (ref 43.0–77.0)
Platelets: 891 10*3/uL — ABNORMAL HIGH (ref 150.0–400.0)
RBC: 4 Mil/uL (ref 3.87–5.11)
RDW: 14.3 % (ref 11.5–15.5)
WBC: 6.4 10*3/uL (ref 4.0–10.5)

## 2023-01-18 LAB — LIPID PANEL
Cholesterol: 131 mg/dL (ref 0–200)
HDL: 81.2 mg/dL (ref >39.00)
LDL Cholesterol: 42 mg/dL (ref 0–99)
NonHDL: 49.76
Total CHOL/HDL Ratio: 2
Triglycerides: 37 mg/dL (ref 0.0–149.0)
VLDL: 7.4 mg/dL (ref 0.0–40.0)

## 2023-01-18 LAB — COMPREHENSIVE METABOLIC PANEL
ALT: 12 U/L (ref 0–35)
AST: 14 U/L (ref 0–37)
Albumin: 3.9 g/dL (ref 3.5–5.2)
Alkaline Phosphatase: 75 U/L (ref 39–117)
BUN: 13 mg/dL (ref 6–23)
CO2: 31 mEq/L (ref 19–32)
Calcium: 9.6 mg/dL (ref 8.4–10.5)
Chloride: 99 mEq/L (ref 96–112)
Creatinine, Ser: 0.67 mg/dL (ref 0.40–1.20)
GFR: 112.24 mL/min (ref >60.00)
Glucose, Bld: 96 mg/dL (ref 70–99)
Potassium: 4.2 mEq/L (ref 3.5–5.1)
Sodium: 139 mEq/L (ref 135–145)
Total Bilirubin: 0.5 mg/dL (ref 0.2–1.2)
Total Protein: 7.6 g/dL (ref 6.0–8.3)

## 2023-01-18 LAB — MICROALBUMIN / CREATININE URINE RATIO
Creatinine,U: 109.5 mg/dL
Microalb Creat Ratio: 0.6 mg/g (ref 0.0–30.0)
Microalb, Ur: <0.7 mg/dL (ref 0.0–1.9)

## 2023-01-18 LAB — TSH: TSH: 0.6 u[IU]/mL (ref 0.35–5.50)

## 2023-01-18 LAB — HEMOGLOBIN A1C: Hgb A1c MFr Bld: 9.3 % — ABNORMAL HIGH (ref 4.6–6.5)

## 2023-01-18 NOTE — Patient Instructions (Addendum)
Return in about 1 year (around 01/20/2024) for cpe (20 min).        Great to see you today.  I have refilled the medication(s) we provide.   If labs were collected, we will inform you of lab results once received either by echart message or telephone call.   - echart message- for normal results that have been seen by the patient already.   - telephone call: abnormal results or if patient has not viewed results in their echart.

## 2023-01-18 NOTE — Progress Notes (Signed)
Patient ID: Nicole Carrillo, female  DOB: Apr 27, 1986, 37 y.o.   MRN: [id number] Patient Care Team    Relationship Specialty Notifications Start End  Ma Hillock, DO PCP - General Family Medicine  02/22/20   Berniece Salines, DO PCP - Cardiology Cardiology  09/12/20   Garnet Sierras, DO Consulting Physician Allergy  04/28/20   Clydie Braun, MD  Obstetrics and Gynecology  03/07/21   Katheran James., MD  Endocrinology  04/02/22     Chief Complaint  Patient presents with   Annual Exam    [facility]; pt is fasting    Subjective: Nicole Carrillo is a 37 y.o.  Female  present for CPE All past medical history, surgical history, allergies, family history, immunizations, medications and social history were updated in the electronic medical record today. All recent labs, ED visits and hospitalizations within the last year were reviewed.  Health maintenance:  Colonoscopy:no fhx- routine screen 62  Mammogram:no fhx- routine screen 40 Cervical cancer screening:UTD w/ gyn Immunizations: tdap UTD 2021, Influenza  (encouraged yearly), PNA decliend Infectious disease screening: HIV completed, Hep C completed today DEXA:rotuine visit.  Assistive device: none Oxygen use: none Patient has a Dental home. Hospitalizations/ED visits: reviewed     01/18/2023   10:02 AM 05/16/2021    1:16 PM 04/03/2021    1:09 PM 03/07/2021   11:01 AM 02/22/2020    3:30 PM  Depression screen PHQ 2/9  Decreased Interest 0 0 0 0 0  Down, Depressed, Hopeless 0 0 0 0 0  PHQ - 2 Score 0 0 0 0 0  Altered sleeping  0  0   Tired, decreased energy  0  0   Change in appetite  0  0   Feeling bad or failure about yourself   0  0   Trouble concentrating  0  0   Moving slowly or fidgety/restless  0  0   Suicidal thoughts  0  0   PHQ-9 Score  0  0       01/18/2023   10:03 AM 02/17/2020    9:55 AM 02/10/2020    9:35 AM 01/14/2020    9:47 AM  GAD 7 : Generalized Anxiety Score  Nervous, Anxious, on Edge 0 0 0 0  Control/stop  worrying 0 0 0 0  Worry too much - different things 0 0 0 0  Trouble relaxing 0 0 0 0  Restless 0 0 0 0  Easily annoyed or irritable 0 0 0 0  Afraid - awful might happen 0 0 0 0  Total GAD 7 Score 0 0 0 0    Immunization History  Administered Date(s) Administered   Influenza Split 08/25/2020, 08/24/2021   Influenza,inj,Quad PF,6+ Mos 08/25/2020, 08/24/2021, 09/09/2022   PFIZER(Purple Top)SARS-COV-2 Vaccination 03/31/2020, 04/25/2020, 12/29/2020   Tdap 12/17/2019, 09/12/2021    Past Medical History:  Diagnosis Date   Adverse food reaction 04/28/2020   Allergy    Angio-edema    Anxiety    Asthma    Chicken pox    Constipation    Diabetes mellitus type 1 (HCC)    Dysphagia    Dysthymia    Eczema    Elevated platelet count    Fetal CPAM    Folliculitis    GERD (gastroesophageal reflux disease)    Hashimoto's thyroiditis    History of migraine    Hyperlipidemia    Insulin pump in place    Left breast mass 01/14/2017  fibroadenoma- benign   OCD (obsessive compulsive disorder)    Orthodontics    permanent upper and lower retainers   Preeclampsia 02/24/2020   PVD (posterior vitreous detachment), right    Rectal bleeding    Rectal fissure    Tinnitus    Urticaria    Wears contact lenses    Allergies  Allergen Reactions   Molds & Smuts Itching and Shortness Of Breath   Acetaminophen Other (See Comments)    Effects blood sugars and insulin pump.   Cephalexin Nausea Only and Other (See Comments)    Headaches, dizziness   Pneumococcal Vaccines Swelling    Local injection swelling   Prednisone Nausea Only and Other (See Comments)    Headaches, dizziness, Prefers not to take because of uncontrollable BG's on this med;      Sesame Oil    Adhesive [Tape] Rash    With extended exposure   Dust Mite Extract Rash   Past Surgical History:  Procedure Laterality Date   CESAREAN SECTION  11/26/2021   DENTAL SURGERY     ENDOSCOPIC CONCHA BULLOSA RESECTION Bilateral  07/24/2018   Procedure: ENDOSCOPIC CONCHA BULLOSA MIDDLE TURBINATE;  Surgeon: Margaretha Sheffield, MD;  Location: [facility];  Service: ENT;  Laterality: Bilateral;  Diabetic - insulin pump   SEPTOPLASTY N/A 07/24/2018   Procedure: SEPTOPLASTY;  Surgeon: Margaretha Sheffield, MD;  Location: [place];  Service: ENT;  Laterality: N/A;  NEEDS TO BE FIRST INSULIN PUMP   TURBINATE REDUCTION Bilateral 07/24/2018   Procedure: INFERIOR TURBINATE REDUCTION;  Surgeon: Margaretha Sheffield, MD;  Location: [place] [facility];  Service: ENT;  Laterality: Bilateral;   WISDOM TOOTH EXTRACTION     Family History  Problem Relation Age of Onset   Arthritis Mother    Stroke Mother    Miscarriages / [place] Mother    Arthritis Father    Dementia Maternal Grandmother    Emphysema Maternal Grandfather    Social History   Social History Narrative   Marital status/children/pets: Married. Soon to have 1 son.    Education/employment: MSN, Nurse.    Safety:      -smoke alarm in the home:Yes     - wears seatbelt: Yes     - Feels safe in their relationships: Yes    Allergies as of 01/18/2023       Reactions   Molds & Smuts Itching, Shortness Of Breath   Acetaminophen Other (See Comments)   Effects blood sugars and insulin pump.   Cephalexin Nausea Only, Other (See Comments)   Headaches, dizziness   Pneumococcal Vaccines Swelling   Local injection swelling   Prednisone Nausea Only, Other (See Comments)   Headaches, dizziness, Prefers not to take because of uncontrollable BG's on this med;    Sesame Oil    Adhesive [tape] Rash   With extended exposure   Dust Mite Extract Rash        Medication List        Accurate as of January 18, 2023 10:25 AM. If you have any questions, ask your nurse or doctor.          albuterol 108 (90 Base) MCG/ACT inhaler Commonly known as: VENTOLIN HFA Inhale 2 puffs into the lungs every 4 (four) hours as needed for wheezing or shortness of breath.    Arnuity Ellipta 100 MCG/ACT Aepb Generic drug: Fluticasone Furoate Inhale 1 puff into the lungs daily. Rinse mouth after each use.   diphenhydrAMINE 25 mg capsule Commonly known as: BENADRYL  Take by mouth.   EPINEPHrine 0.3 mg/0.3 mL Soaj injection Commonly known as: Auvi-Q Inject 0.3 mg into the muscle as needed for anaphylaxis.   fluticasone furoate-vilanterol 200-25 MCG/ACT Aepb Commonly known as: Breo Ellipta Inhale 1 puff into the lungs daily. During Asthma Flares. Started by: Gareth Morgan, FNP   Glucagon 1 MG/0.2ML Soaj Inject 1 mg into the skin as directed.   Microlet Lancets Misc   montelukast 10 MG tablet Commonly known as: Singulair Take 1 tablet (10 mg total) by mouth at bedtime.   NON FORMULARY Continuous glucose monitor   NovoLOG 100 UNIT/ML injection Generic drug: insulin aspart SMARTSIG:0-40 Unit(s) SUB-Q Daily   PROBIOTIC DAILY PO Take by mouth.   Pulmicort Flexhaler 90 MCG/ACT inhaler Generic drug: Budesonide Inhale 2 puffs into the lungs 2 (two) times daily.        All past medical history, surgical history, allergies, family history, immunizations andmedications were updated in the EMR today and reviewed under the history and medication portions of their EMR.      ROS: 14 pt review of systems performed and negative (unless mentioned in an HPI)  Objective: BP 115/77   Pulse 85   Temp 98.2 F (36.8 C)   Ht 5' 2"$  (1.575 m)   Wt 99 lb 9.6 oz (45.2 kg)   SpO2 100%   BMI 18.22 kg/m  Physical Exam Vitals and nursing note reviewed.  Constitutional:      General: She is not in acute distress.    Appearance: Normal appearance. She is not ill-appearing or toxic-appearing.  HENT:     Head: Normocephalic and atraumatic.     Right Ear: Tympanic membrane, ear canal and external ear normal. There is no impacted cerumen.     Left Ear: Tympanic membrane, ear canal and external ear normal. There is no impacted cerumen.     Nose: No congestion or  rhinorrhea.     Mouth/Throat:     Mouth: Mucous membranes are moist.     Pharynx: Oropharynx is clear. No oropharyngeal exudate or posterior oropharyngeal erythema.  Eyes:     General: No scleral icterus.       Right eye: No discharge.        Left eye: No discharge.     Extraocular Movements: Extraocular movements intact.     Conjunctiva/sclera: Conjunctivae normal.     Pupils: Pupils are equal, round, and reactive to light.  Cardiovascular:     Rate and Rhythm: Normal rate and regular rhythm.     Pulses: Normal pulses.     Heart sounds: Normal heart sounds. No murmur heard.    No friction rub. No gallop.  Pulmonary:     Effort: Pulmonary effort is normal. No respiratory distress.     Breath sounds: Normal breath sounds. No stridor. No wheezing, rhonchi or rales.  Chest:     Chest wall: No tenderness.  Abdominal:     General: Abdomen is flat. Bowel sounds are normal. There is no distension.     Palpations: Abdomen is soft. There is no mass.     Tenderness: There is no abdominal tenderness. There is no right CVA tenderness, left CVA tenderness, guarding or rebound.     Hernia: No hernia is present.  Musculoskeletal:        General: No swelling, tenderness or deformity. Normal range of motion.     Cervical back: Normal range of motion and neck supple. No rigidity or tenderness.     Right lower leg:  No edema.     Left lower leg: No edema.  Lymphadenopathy:     Cervical: No cervical adenopathy.  Skin:    General: Skin is warm and dry.     Coloration: Skin is not jaundiced or pale.     Findings: No bruising, erythema, lesion or rash.  Neurological:     General: No focal deficit present.     Mental Status: She is alert and oriented to person, place, and time. Mental status is at baseline.     Cranial Nerves: No cranial nerve deficit.     Sensory: No sensory deficit.     Motor: No weakness.     Coordination: Coordination normal.     Gait: Gait normal.     Deep Tendon Reflexes:  Reflexes normal.  Psychiatric:        Mood and Affect: Mood normal.        Behavior: Behavior normal.        Thought Content: Thought content normal.        Judgment: Judgment normal.    Diabetic Foot Exam - Simple   Simple Foot Form Diabetic Foot exam was performed with the following findings: Yes 01/18/2023 10:07 AM  Visual Inspection No deformities, no ulcerations, no other skin breakdown bilaterally: Yes Sensation Testing Intact to touch and monofilament testing bilaterally: Yes Pulse Check Posterior Tibialis and Dorsalis pulse intact bilaterally: Yes Comments      No results found.  Assessment/plan: Nicole Carrillo is a 37 y.o. female present for CPE Routine general medical examination at a health care facility Patient was encouraged to exercise greater than 150 minutes a week. Patient was encouraged to choose a diet filled with fresh fruits and vegetables, and lean meats. AVS provided to patient today for education/recommendation on gender specific health and safety maintenance. Colonoscopy:no fhx- routine screen 53  Mammogram:no fhx- routine screen 40 Cervical cancer screening:UTD w/ gyn Immunizations: tdap UTD 2021, Influenza  (encouraged yearly), PNA decliend Infectious disease screening: HIV completed, Hep C completed today DEXA:rotuine visit.   Need for vaccination for pneumococcus declined  Hashimoto's thyroiditis - Lipid panel - TSH Type 1 diabetes mellitus with hyperglycemia ([place]) Managed by endocrinology - CBC with Differential/Platelet - Comprehensive metabolic panel - Hemoglobin A1c - Lipid panel - Urine Microalbumin w/creat. ratio - foot exam:01/18/2023 - microalb: 01/18/2023 - EYE: 10/2022   Orders Placed This Encounter  Procedures   CBC with Differential/Platelet   Comprehensive metabolic panel   Hemoglobin A1c   Lipid panel   TSH   Urine Microalbumin w/creat. ratio   No orders of the defined types were placed in this encounter.  Referral  Orders  No referral(s) requested today     Electronically signed by: Howard Pouch, [place]

## 2023-01-21 ENCOUNTER — Telehealth: Payer: Self-pay | Admitting: Oncology

## 2023-01-21 ENCOUNTER — Telehealth: Payer: Self-pay | Admitting: Family Medicine

## 2023-01-21 DIAGNOSIS — D6859 Other primary thrombophilia: Secondary | ICD-10-CM | POA: Insufficient documentation

## 2023-01-21 NOTE — Telephone Encounter (Signed)
Scheduled appt per 2/12 referral. Pt is aware of appt date and time. Pt is aware to arrive 15 mins prior to appt time and to bring and updated insurance card. Pt is aware of appt location.

## 2023-01-21 NOTE — Telephone Encounter (Signed)
Spoke with patient regarding results/recommendations.  

## 2023-01-21 NOTE — Telephone Encounter (Signed)
Please call patient Nicole Carrillo, kidney and thyroid function is normal. Cholesterol panel is at goal and electrolytes are normal. Blood cell counts are normal, with the exception of Nicole platelets being extremely high at 891.  I have referred Nicole to hematology for further evaluation since this is significantly high for platelet counts.  We do see platelet counts rise is somebody has been recently ill, as part of flame Tory reaction, but even so these are higher than average. Nicole A1c is 9.3.  I would encourage Nicole to talk to Nicole endocrinologist concerning elevated A1c.

## 2023-01-25 ENCOUNTER — Other Ambulatory Visit: Payer: Self-pay | Admitting: *Deleted

## 2023-01-25 ENCOUNTER — Other Ambulatory Visit: Payer: Self-pay

## 2023-01-25 ENCOUNTER — Inpatient Hospital Stay: Payer: BC Managed Care – PPO

## 2023-01-25 ENCOUNTER — Encounter: Payer: Self-pay | Admitting: Oncology

## 2023-01-25 ENCOUNTER — Inpatient Hospital Stay: Payer: BC Managed Care – PPO | Attending: Oncology | Admitting: Oncology

## 2023-01-25 VITALS — BP 127/86 | HR 79 | Temp 98.2°F | Resp 17 | Ht 62.0 in | Wt 99.3 lb

## 2023-01-25 DIAGNOSIS — D75839 Thrombocytosis, unspecified: Secondary | ICD-10-CM | POA: Diagnosis present

## 2023-01-25 DIAGNOSIS — R7989 Other specified abnormal findings of blood chemistry: Secondary | ICD-10-CM

## 2023-01-25 LAB — CBC WITH DIFFERENTIAL/PLATELET
Abs Immature Granulocytes: 0.02 10*3/uL (ref 0.00–0.07)
Basophils Absolute: 0.1 10*3/uL (ref 0.0–0.1)
Basophils Relative: 2 %
Eosinophils Absolute: 0 10*3/uL (ref 0.0–0.5)
Eosinophils Relative: 0 %
HCT: 40.7 % (ref 36.0–46.0)
Hemoglobin: 13.9 g/dL (ref 12.0–15.0)
Immature Granulocytes: 1 %
Lymphocytes Relative: 32 %
Lymphs Abs: 1.4 10*3/uL (ref 0.7–4.0)
MCH: 29.8 pg (ref 26.0–34.0)
MCHC: 34.2 g/dL (ref 30.0–36.0)
MCV: 87.2 fL (ref 80.0–100.0)
Monocytes Absolute: 0.4 10*3/uL (ref 0.1–1.0)
Monocytes Relative: 9 %
Neutro Abs: 2.4 10*3/uL (ref 1.7–7.7)
Neutrophils Relative %: 56 %
Platelets: 645 10*3/uL — ABNORMAL HIGH (ref 150–400)
RBC: 4.67 MIL/uL (ref 3.87–5.11)
RDW: 14.3 % (ref 11.5–15.5)
WBC: 4.3 10*3/uL (ref 4.0–10.5)
nRBC: 0 % (ref 0.0–0.2)

## 2023-01-25 LAB — SEDIMENTATION RATE: Sed Rate: 29 mm/hr — ABNORMAL HIGH (ref 0–22)

## 2023-01-25 LAB — FERRITIN: Ferritin: 68 ng/mL (ref 11–307)

## 2023-01-25 LAB — TECHNOLOGIST SMEAR REVIEW

## 2023-01-25 LAB — C-REACTIVE PROTEIN: CRP: 0.6 mg/dL (ref <1.0)

## 2023-01-25 LAB — VITAMIN B12: Vitamin B-12: 687 pg/mL (ref 180–914)

## 2023-01-25 NOTE — Progress Notes (Signed)
[facility] Initial Visit:  Patient Care Team: Ma Hillock, DO as PCP - General (Family Medicine) Berniece Salines, DO as PCP - Cardiology (Cardiology) Garnet Sierras, DO as Consulting Physician (Allergy) Clydie Braun, MD (Obstetrics and Gynecology) Katheran James., MD (Endocrinology)  CHIEF COMPLAINTS/PURPOSE OF CONSULTATION:  HISTORY OF PRESENTING ILLNESS: Nicole Carrillo 37 y.o. female is here because of thrombocytosis Medical history notable for diabetes mellitus type 1, angioedema, asthma, chickenpox, folliculitis, Hashimoto's thyroiditis, retinal detachment, elevated platelet count, migraines, tinnitus, uretic area, GERD, preeclampsia, dermatographism  January 01, 2022: WBC 6.9 hemoglobin 13.0 platelet count 509  January 18, 2023:  WBC 6.4 hemoglobin 12 platelet count 891; 74 segs 20 lymphs 6 monos 1 EO 1 basophil CMP normal  January 25 2023:  [facility] Diagnosed with DM Type I at 37 years of age States that in January 2023 she was post partum.  Not having any epistaxis or gingival bleeding.  Not hematuria.   No history of splenectomy  Social:  Engineer, mining (New grad).  Tobacco none.  EtOH rare  [facility] Mother alive 101 subarachnoid brain hemorrhage, HTN Father alive 41 DM Type II.  Thalassemia.   Brother alive 58 well Sister alive 23 well    Review of Systems  Constitutional:  Negative for appetite change, chills, fatigue and fever.       Has lost 4 lbs over the past few months.  Has difficulty gaining weight  HENT:   Negative for lump/mass, mouth sores, nosebleeds, sore throat, trouble swallowing and voice change.   Eyes:  Positive for eye problems. Negative for icterus.       Vision changes:  None  Respiratory:  Negative for chest tightness, cough, hemoptysis, shortness of breath and wheezing.        PND:  none Orthopnea:  none DOE:    Cardiovascular:  Negative for chest pain, leg swelling and palpitations.        Seeing GI regarding GERD and dysphagia  Gastrointestinal:  Negative for abdominal pain, blood in stool, constipation, diarrhea, nausea and vomiting.  Endocrine: Negative for hot flashes.       Cold intolerance:  none Heat intolerance:  none  Genitourinary:  Negative for bladder incontinence, difficulty urinating, dysuria, frequency, hematuria and nocturia.   Musculoskeletal:  Negative for arthralgias, back pain, gait problem, myalgias, neck pain and neck stiffness.  Skin:  Positive for itching and rash. Negative for wound.  Neurological:  Negative for dizziness, extremity weakness, gait problem, headaches, light-headedness, numbness, seizures and speech difficulty.  Hematological:  Negative for adenopathy. Does not bruise/bleed easily.  Psychiatric/Behavioral:  Negative for sleep disturbance and suicidal ideas. The patient is not nervous/anxious.     MEDICAL HISTORY: Past Medical History:  Diagnosis Date   Adverse food reaction 04/28/2020   Allergy    Angio-edema    Anxiety    Asthma    Chicken pox    Constipation    Diabetes mellitus type 1 (HCC)    Dysphagia    Dysthymia    Eczema    Elevated platelet count    Fetal CPAM    Folliculitis    GERD (gastroesophageal reflux disease)    Hashimoto's thyroiditis    History of migraine    Hyperlipidemia    Insulin pump in place    Left breast mass 01/14/2017   fibroadenoma- benign   OCD (obsessive compulsive disorder)    Orthodontics    permanent upper and lower retainers  Preeclampsia 02/24/2020   PVD (posterior vitreous detachment), right    Rectal bleeding    Rectal fissure    Tinnitus    Urticaria    Wears contact lenses     SURGICAL HISTORY: Past Surgical History:  Procedure Laterality Date   CESAREAN SECTION  11/26/2021   DENTAL SURGERY     ENDOSCOPIC CONCHA BULLOSA RESECTION Bilateral 07/24/2018   Procedure: ENDOSCOPIC CONCHA BULLOSA MIDDLE TURBINATE;  Surgeon: Margaretha Sheffield, MD;  Location: [facility];   Service: ENT;  Laterality: Bilateral;  Diabetic - insulin pump   SEPTOPLASTY N/A 07/24/2018   Procedure: SEPTOPLASTY;  Surgeon: Margaretha Sheffield, MD;  Location: [facility];  Service: ENT;  Laterality: N/A;  NEEDS TO BE FIRST INSULIN PUMP   TURBINATE REDUCTION Bilateral 07/24/2018   Procedure: [place];  Surgeon: Margaretha Sheffield, MD;  Location: [facility];  Service: ENT;  Laterality: Bilateral;   WISDOM TOOTH EXTRACTION      SOCIAL HISTORY: Social History   Socioeconomic History   Marital status: Married    Spouse name: Not on file   Number of children: Not on file   Years of education: Not on file   Highest education level: Doctorate  Occupational History   Not on file  Tobacco Use   Smoking status: Never   Smokeless tobacco: Never  Vaping Use   Vaping Use: Never used  Substance and Sexual Activity   Alcohol use: Not Currently    Alcohol/week: 1.0 standard drink of alcohol    Types: 1 Glasses of wine per week    Comment: occ   Drug use: Not Currently   Sexual activity: Yes    Partners: Male    Birth control/protection: None  Other Topics Concern   Not on file  Social History Narrative   Marital status/children/pets: Married. Soon to have 1 son.    Education/employment: MSN, Nurse.    Safety:      -smoke alarm in the home:Yes     - wears seatbelt: Yes     - Feels safe in their relationships: Yes   Social Determinants of Health   Financial Resource Strain: Low Risk  (09/06/2022)   Overall Financial Resource Strain (CARDIA)    Difficulty of Paying Living Expenses: Not hard at all  Food Insecurity: No Food Insecurity (09/06/2022)   Hunger Vital Sign    Worried About Running Out of Food in the Last Year: Never true    Ran Out of Food in the Last Year: Never true  Transportation Needs: No Transportation Needs (09/06/2022)   PRAPARE - Hydrologist (Medical): No    Lack of Transportation (Non-Medical): No   Physical Activity: Insufficiently Active (09/06/2022)   Exercise Vital Sign    Days of Exercise per Week: 1 day    Minutes of Exercise per Session: 20 min  Stress: No Stress Concern Present (09/06/2022)   [place]    Feeling of Stress : Only a little  Social Connections: Moderately Integrated (09/06/2022)   Social Connection and Isolation Panel [NHANES]    Frequency of Communication with Friends and Family: Once a week    Frequency of Social Gatherings with Friends and Family: Three times a week    Attends Religious Services: More than 4 times per year    Active Member of Clubs or Organizations: No    Attends Archivist Meetings: Not on file    Marital Status:  Married  Intimate Partner Violence: Not At Risk (01/25/2023)   Humiliation, Afraid, Rape, and Kick questionnaire    Fear of Current or Ex-Partner: No    Emotionally Abused: No    Physically Abused: No    Sexually Abused: No    FAMILY HISTORY Family History  Problem Relation Age of Onset   Arthritis Mother    Stroke Mother    Miscarriages / [place] Mother    Arthritis Father    Dementia Maternal Grandmother    Emphysema Maternal Grandfather     ALLERGIES:  is allergic to molds & smuts, acetaminophen, cephalexin, pneumococcal vaccines, prednisone, sesame oil, adhesive [tape], and dust mite extract.  MEDICATIONS:  Current Outpatient Medications  Medication Sig Dispense Refill   albuterol (VENTOLIN HFA) 108 (90 Base) MCG/ACT inhaler Inhale 2 puffs into the lungs every 4 (four) hours as needed for wheezing or shortness of breath. 18 g 1   diphenhydrAMINE (BENADRYL) 25 mg capsule Take by mouth.     EPINEPHrine (AUVI-Q) 0.3 mg/0.3 mL IJ SOAJ injection Inject 0.3 mg into the muscle as needed for anaphylaxis. 1 each 2   Fluticasone Furoate (ARNUITY ELLIPTA) 100 MCG/ACT AEPB Inhale 1 puff into the lungs daily. Rinse mouth after each use. 90 each 2    Glucagon 1 MG/0.2ML SOAJ Inject 1 mg into the skin as directed. 0.2 mL 1   Microlet Lancets MISC      montelukast (SINGULAIR) 10 MG tablet Take 1 tablet (10 mg total) by mouth at bedtime. 90 tablet 2   Multiple Vitamin (MULTIVITAMIN) tablet Take 1 tablet by mouth daily.     NON FORMULARY Continuous glucose monitor     NOVOLOG 100 UNIT/ML injection SMARTSIG:0-40 Unit(s) SUB-Q Daily     No current facility-administered medications for this visit.    PHYSICAL EXAMINATION:  ECOG PERFORMANCE STATUS: 0 - Asymptomatic   Vitals:   01/25/23 1125  BP: 127/86  Pulse: 79  Resp: 17  Temp: 98.2 F (36.8 C)  SpO2: 99%    Filed Weights   01/25/23 1125  Weight: 99 lb 4.8 oz (45 kg)     Physical Exam Vitals and nursing note reviewed.  Constitutional:      Appearance: Normal appearance. She is normal weight. She is not toxic-appearing or diaphoretic.     Comments: Here alone  HENT:     Head: Normocephalic and atraumatic.     Right Ear: External ear normal.     Left Ear: External ear normal.     Nose: Nose normal. No congestion or rhinorrhea.  Eyes:     General: No scleral icterus.    Extraocular Movements: Extraocular movements intact.     Conjunctiva/sclera: Conjunctivae normal.     Pupils: Pupils are equal, round, and reactive to light.  Cardiovascular:     Rate and Rhythm: Normal rate and regular rhythm.     Heart sounds: No murmur heard.    No friction rub. No gallop.  Pulmonary:     Effort: Pulmonary effort is normal. No respiratory distress.     Breath sounds: Normal breath sounds.  Abdominal:     General: Bowel sounds are normal.     Palpations: Abdomen is soft.     Tenderness: There is no abdominal tenderness. There is no guarding or rebound.  Musculoskeletal:        General: No swelling, tenderness or deformity.     Cervical back: Normal range of motion and neck supple. No rigidity or tenderness.  Right lower leg: No edema.     Left lower leg: No edema.   Lymphadenopathy:     Head:     Right side of head: No submental, submandibular, tonsillar, preauricular, posterior auricular or occipital adenopathy.     Left side of head: No submental, submandibular, tonsillar, preauricular, posterior auricular or occipital adenopathy.     Cervical: No cervical adenopathy.     Right cervical: No superficial, deep or posterior cervical adenopathy.    Left cervical: No superficial, deep or posterior cervical adenopathy.     Upper Body:     Right upper body: No supraclavicular, axillary, pectoral or epitrochlear adenopathy.     Left upper body: No supraclavicular, axillary, pectoral or epitrochlear adenopathy.  Skin:    General: Skin is warm.     Coloration: Skin is not jaundiced.  Neurological:     General: No focal deficit present.     Mental Status: She is alert and oriented to person, place, and time.     Cranial Nerves: No cranial nerve deficit.     Motor: No weakness.     Gait: Gait normal.  Psychiatric:        Mood and Affect: Mood normal.        Behavior: Behavior normal.        Thought Content: Thought content normal.        Judgment: Judgment normal.      LABORATORY DATA: I have personally reviewed the data as listed:  Office Visit on 01/18/2023  Component Date Value Ref Range Status   WBC 01/18/2023 6.4  4.0 - 10.5 K/uL Final   RBC 01/18/2023 4.00  3.87 - 5.11 Mil/uL Final   Hemoglobin 01/18/2023 12.0  12.0 - 15.0 g/dL Final   HCT 01/18/2023 35.6 (L)  36.0 - 46.0 % Final   MCV 01/18/2023 89.0  78.0 - 100.0 fl Final   MCHC 01/18/2023 33.7  30.0 - 36.0 g/dL Final   RDW 01/18/2023 14.3  11.5 - 15.5 % Final   Platelets 01/18/2023 891.0 (H)  150.0 - 400.0 K/uL Final   Neutrophils Relative % 01/18/2023 73.6  43.0 - 77.0 % Final   Lymphocytes Relative 01/18/2023 19.5  12.0 - 46.0 % Final   Monocytes Relative 01/18/2023 5.5  3.0 - 12.0 % Final   Eosinophils Relative 01/18/2023 0.7  0.0 - 5.0 % Final   Basophils Relative 01/18/2023 0.7   0.0 - 3.0 % Final   Neutro Abs 01/18/2023 4.7  1.4 - 7.7 K/uL Final   Lymphs Abs 01/18/2023 1.3  0.7 - 4.0 K/uL Final   Monocytes Absolute 01/18/2023 0.4  0.1 - 1.0 K/uL Final   Eosinophils Absolute 01/18/2023 0.0  0.0 - 0.7 K/uL Final   Basophils Absolute 01/18/2023 0.0  0.0 - 0.1 K/uL Final   Sodium 01/18/2023 139  135 - 145 mEq/L Final   Potassium 01/18/2023 4.2  3.5 - 5.1 mEq/L Final   Chloride 01/18/2023 99  96 - 112 mEq/L Final   CO2 01/18/2023 31  19 - 32 mEq/L Final   Glucose, Bld 01/18/2023 96  70 - 99 mg/dL Final   BUN 01/18/2023 13  6 - 23 mg/dL Final   Creatinine, Ser 01/18/2023 0.67  0.40 - 1.20 mg/dL Final   Total Bilirubin 01/18/2023 0.5  0.2 - 1.2 mg/dL Final   Alkaline Phosphatase 01/18/2023 75  39 - 117 U/L Final   AST 01/18/2023 14  0 - 37 U/L Final   ALT 01/18/2023 12  0 -  35 U/L Final   Total Protein 01/18/2023 7.6  6.0 - 8.3 g/dL Final   Albumin 01/18/2023 3.9  3.5 - 5.2 g/dL Final   GFR 01/18/2023 112.24  >60.00 mL/min Final   Calculated using the CKD-EPI Creatinine Equation (2021)   Calcium 01/18/2023 9.6  8.4 - 10.5 mg/dL Final   Hgb A1c MFr Bld 01/18/2023 9.3 (H)  4.6 - 6.5 % Final   Glycemic Control Guidelines for People with Diabetes:Non Diabetic:  <6%Goal of Therapy: <7%Additional Action Suggested:  >8%    Cholesterol 01/18/2023 131  0 - 200 mg/dL Final   ATP III Classification       Desirable:  < 200 mg/dL               Borderline High:  200 - 239 mg/dL          High:  > = 240 mg/dL   Triglycerides 01/18/2023 37.0  0.0 - 149.0 mg/dL Final   Normal:  <150 mg/dLBorderline High:  150 - 199 mg/dL   HDL 01/18/2023 81.20  >39.00 mg/dL Final   VLDL 01/18/2023 7.4  0.0 - 40.0 mg/dL Final   LDL Cholesterol 01/18/2023 42  0 - 99 mg/dL Final   Total CHOL/HDL Ratio 01/18/2023 2   Final                  Men          Women1/2 Average Risk     3.4          3.3Average Risk          5.0          4.42X Average Risk          9.6          7.13X Average Risk          15.0           11.0                       NonHDL 01/18/2023 49.76   Final   NOTE:  Non-HDL goal should be 30 mg/dL higher than patient's LDL goal (i.e. LDL goal of < 70 mg/dL, would have non-HDL goal of < 100 mg/dL)   TSH 01/18/2023 0.60  0.35 - 5.50 uIU/mL Final   Microalb, Ur 01/18/2023 <0.7  0.0 - 1.9 mg/dL Final   Creatinine,U 01/18/2023 109.5  mg/dL Final   Microalb Creat Ratio 01/18/2023 0.6  0.0 - 30.0 mg/g Final    RADIOGRAPHIC STUDIES: I have personally reviewed the radiological images as listed and agree with the findings in the report  No results found.  ASSESSMENT/PLAN  Thrombocytosis Platelet count of ?450  109/L is a generally accepted value. A cohort study evaluating 10,000 [place] patients found a platelet count >  than 409  109/L for women and 381  109/L for men represented the 99th percentile in this population  Causes of Thrombocytosis Clonal Essential thrombocythemia  Polycythemia vera Primary myelofibrosis  Myelodysplasia with del (5q) Refractory anemia with ringed sideroblasts associated with marked thrombocytosis (RARS-T) Chronic myeloid leukemia  Chronic myelomonocytic leukemia typical chronic myeloid leukemia  MDS/MPN-U  POEMS syndrome  Familial thrombocytosis  Reactive Infection Inflammation Tissue damage  Hyposplenism  Post-operative  Iron deficiency Malignancy Hemolysis Drug effect "Rebound" following myelosuppression  Spurious Microspherocytes Cryoglobulinemia Neoplastic cell fragments Schistocytes     Evaluation:  Obtain CBC with diff, smear for morphology review, ESR, CRP, PCR for bcr-abl, Jak 2 with  reflex panel, ferritin.    Cancer Staging  No matching staging information was found for the patient.   No problem-specific Assessment & Plan notes found for this encounter.   No orders of the defined types were placed in this encounter.  45  minutes was spent in patient care.  This included time spent preparing to see the patient  (e.g., review of tests), obtaining and/or reviewing separately obtained history, counseling and educating the patient/, ordering tests, documenting clinical information in the electronic or other health record, independently interpreting results and communicating results to the patient as well as coordination of care.      All questions were answered. The patient knows to call the clinic with any problems, questions or concerns.  This note was electronically signed.    Barbee Cough, MD  01/25/2023 11:38 AM

## 2023-01-25 NOTE — Patient Instructions (Signed)
Please take a baby aspirin daily

## 2023-01-27 LAB — HAPTOGLOBIN: Haptoglobin: 209 mg/dL (ref 33–278)

## 2023-01-30 LAB — HGB FRACTIONATION CASCADE
Hgb A2: 2.7 % (ref 1.8–3.2)
Hgb A: 97.3 % (ref 96.4–98.8)
Hgb F: 0 % (ref 0.0–2.0)
Hgb S: 0 %

## 2023-02-04 LAB — JAK2 (INCLUDING V617F AND EXON 12), MPL,& CALR W/RFL MPN PANEL (NGS)

## 2023-02-04 LAB — BCR ABL1 FISH (GENPATH)

## 2023-02-06 ENCOUNTER — Telehealth: Payer: Self-pay | Admitting: Oncology

## 2023-02-06 ENCOUNTER — Encounter: Payer: Self-pay | Admitting: Oncology

## 2023-02-06 NOTE — Telephone Encounter (Signed)
Per 2/28 IB reached out to patient to reschedule; patient is going to keep the appointment.

## 2023-02-15 ENCOUNTER — Inpatient Hospital Stay: Payer: PRIVATE HEALTH INSURANCE

## 2023-02-15 ENCOUNTER — Inpatient Hospital Stay: Payer: PRIVATE HEALTH INSURANCE | Attending: Oncology | Admitting: Oncology

## 2023-02-15 VITALS — BP 128/75 | HR 64 | Temp 97.5°F | Resp 18 | Wt 100.4 lb

## 2023-02-15 DIAGNOSIS — E1065 Type 1 diabetes mellitus with hyperglycemia: Secondary | ICD-10-CM | POA: Diagnosis not present

## 2023-02-15 DIAGNOSIS — J45909 Unspecified asthma, uncomplicated: Secondary | ICD-10-CM | POA: Diagnosis not present

## 2023-02-15 DIAGNOSIS — E063 Autoimmune thyroiditis: Secondary | ICD-10-CM | POA: Diagnosis not present

## 2023-02-15 DIAGNOSIS — R7989 Other specified abnormal findings of blood chemistry: Secondary | ICD-10-CM | POA: Diagnosis not present

## 2023-02-15 DIAGNOSIS — J988 Other specified respiratory disorders: Secondary | ICD-10-CM | POA: Diagnosis not present

## 2023-02-15 DIAGNOSIS — E109 Type 1 diabetes mellitus without complications: Secondary | ICD-10-CM | POA: Diagnosis not present

## 2023-02-15 DIAGNOSIS — D75839 Thrombocytosis, unspecified: Secondary | ICD-10-CM | POA: Insufficient documentation

## 2023-02-15 LAB — CBC WITH DIFFERENTIAL/PLATELET
Abs Immature Granulocytes: 0.02 10*3/uL (ref 0.00–0.07)
Basophils Absolute: 0 10*3/uL (ref 0.0–0.1)
Basophils Relative: 0 %
Eosinophils Absolute: 0.1 10*3/uL (ref 0.0–0.5)
Eosinophils Relative: 1 %
HCT: 39.4 % (ref 36.0–46.0)
Hemoglobin: 13.4 g/dL (ref 12.0–15.0)
Immature Granulocytes: 0 %
Lymphocytes Relative: 26 %
Lymphs Abs: 1.7 10*3/uL (ref 0.7–4.0)
MCH: 29.6 pg (ref 26.0–34.0)
MCHC: 34 g/dL (ref 30.0–36.0)
MCV: 87.2 fL (ref 80.0–100.0)
Monocytes Absolute: 0.4 10*3/uL (ref 0.1–1.0)
Monocytes Relative: 5 %
Neutro Abs: 4.6 10*3/uL (ref 1.7–7.7)
Neutrophils Relative %: 68 %
Platelets: 393 10*3/uL (ref 150–400)
RBC: 4.52 MIL/uL (ref 3.87–5.11)
RDW: 14.4 % (ref 11.5–15.5)
WBC: 6.8 10*3/uL (ref 4.0–10.5)
nRBC: 0 % (ref 0.0–0.2)

## 2023-02-15 LAB — FERRITIN: Ferritin: 56 ng/mL (ref 11–307)

## 2023-02-15 NOTE — Progress Notes (Signed)
[facility] Follow up Visit:  Patient Care Team: Ma Hillock, DO as PCP - General (Family Medicine) Berniece Salines, DO as PCP - Cardiology (Cardiology) Garnet Sierras, DO as Consulting Physician (Allergy) Clydie Braun, MD (Obstetrics and Gynecology) Katheran James., MD (Endocrinology)  CHIEF COMPLAINTS/PURPOSE OF CONSULTATION:  HISTORY OF PRESENTING ILLNESS: Nicole Carrillo 37 y.o. female is here because of thrombocytosis Medical history notable for diabetes mellitus type 1, angioedema, asthma, chickenpox, folliculitis, Hashimoto's thyroiditis, retinal detachment, elevated platelet count, migraines, tinnitus, uretic area, GERD, preeclampsia, dermatographism  January 01, 2022: WBC 6.9 hemoglobin 13.0 platelet count 509  January 18, 2023:  WBC 6.4 hemoglobin 12 platelet count 891; 74 segs 20 lymphs 6 monos 1 EO 1 basophil CMP normal  January 25 2023:  [name] Diagnosed with DM Type I at 37 years of age States that in January 2023 she was post partum.  Not having any epistaxis or gingival bleeding.  Not hematuria.   No history of splenectomy  Social:  Engineer, mining (New grad).  Tobacco none.  EtOH rare  [facility] Mother alive 41 subarachnoid brain hemorrhage, HTN Father alive 62 DM Type II.  Thalassemia.   Brother alive 43 well Sister alive 68 well   WBC 4.3 hemoglobin 13.9 MCV 87 platelet count 645; 56 segs 32 lymphs 9 monos Hemoglobin electrophoresis normal adult pattern FISH for BCR able negative JAK2 panel negative Ferritin 68  folate 687 CRP 0.6 sed rate 29  February 15 2023:  Scheduled follow up.  Reviewed results of labs with patient.  Patient states that she has had chronic respiratory illnesses this winter and was recovering from one when labs were drawn in early February 2024. Reports that Hgb A1c has been running high.  Child is in daycare and bringing home pathogens.     Review of Systems  Constitutional:  Negative for  appetite change, chills, fatigue and fever.       Has lost 4 lbs over the past few months.  Has difficulty gaining weight  HENT:   Negative for lump/mass, mouth sores, nosebleeds, sore throat, trouble swallowing and voice change.   Eyes:  Positive for eye problems. Negative for icterus.       Vision changes:  None  Respiratory:  Negative for chest tightness, cough, hemoptysis, shortness of breath and wheezing.        PND:  none Orthopnea:  none DOE:    Cardiovascular:  Negative for chest pain, leg swelling and palpitations.       Seeing GI regarding GERD and dysphagia  Gastrointestinal:  Negative for abdominal pain, blood in stool, constipation, diarrhea, nausea and vomiting.  Endocrine: Negative for hot flashes.       Cold intolerance:  none Heat intolerance:  none  Genitourinary:  Negative for bladder incontinence, difficulty urinating, dysuria, frequency, hematuria and nocturia.   Musculoskeletal:  Negative for arthralgias, back pain, gait problem, myalgias, neck pain and neck stiffness.  Skin:  Positive for itching and rash. Negative for wound.  Neurological:  Negative for dizziness, extremity weakness, gait problem, headaches, light-headedness, numbness, seizures and speech difficulty.  Hematological:  Negative for adenopathy. Does not bruise/bleed easily.  Psychiatric/Behavioral:  Negative for sleep disturbance and suicidal ideas. The patient is not nervous/anxious.     MEDICAL HISTORY: Past Medical History:  Diagnosis Date   Adverse food reaction 04/28/2020   Allergy    Angio-edema    Anxiety    Asthma    Chicken  pox    Constipation    Diabetes mellitus type 1 (HCC)    Dysphagia    Dysthymia    Eczema    Elevated platelet count    Fetal CPAM    Folliculitis    GERD (gastroesophageal reflux disease)    Hashimoto's thyroiditis    History of migraine    Hyperlipidemia    Insulin pump in place    Left breast mass 01/14/2017   fibroadenoma- benign   OCD (obsessive  compulsive disorder)    Orthodontics    permanent upper and lower retainers   Preeclampsia 02/24/2020   PVD (posterior vitreous detachment), right    Rectal bleeding    Rectal fissure    Tinnitus    Urticaria    Wears contact lenses     SURGICAL HISTORY: Past Surgical History:  Procedure Laterality Date   CESAREAN SECTION  11/26/2021   DENTAL SURGERY     ENDOSCOPIC CONCHA BULLOSA RESECTION Bilateral 07/24/2018   Procedure: ENDOSCOPIC CONCHA BULLOSA MIDDLE TURBINATE;  Surgeon: Margaretha Sheffield, MD;  Location: [place];  Service: ENT;  Laterality: Bilateral;  Diabetic - insulin pump   SEPTOPLASTY N/A 07/24/2018   Procedure: SEPTOPLASTY;  Surgeon: Margaretha Sheffield, MD;  Location: [place];  Service: ENT;  Laterality: N/A;  NEEDS TO BE FIRST INSULIN PUMP   TURBINATE REDUCTION Bilateral 07/24/2018   Procedure: INFERIOR TURBINATE REDUCTION;  Surgeon: Margaretha Sheffield, MD;  Location: [place];  Service: ENT;  Laterality: Bilateral;   WISDOM TOOTH EXTRACTION      SOCIAL HISTORY: Social History   Socioeconomic History   Marital status: Married    Spouse name: Not on file   Number of children: Not on file   Years of education: Not on file   Highest education level: Doctorate  Occupational History   Not on file  Tobacco Use   Smoking status: Never   Smokeless tobacco: Never  Vaping Use   Vaping Use: Never used  Substance and Sexual Activity   Alcohol use: Not Currently    Alcohol/week: 1.0 standard drink of alcohol    Types: 1 Glasses of wine per week    Comment: occ   Drug use: Not Currently   Sexual activity: Yes    Partners: Male    Birth control/protection: None  Other Topics Concern   Not on file  Social History Narrative   Marital status/children/pets: Married. Soon to have 1 son.    Education/employment: MSN, Nurse.    Safety:      -smoke alarm in the home:Yes     - wears seatbelt: Yes     - Feels safe in their relationships: Yes    Social Determinants of Health   Financial Resource Strain: Low Risk  (09/06/2022)   Overall Financial Resource Strain (CARDIA)    Difficulty of Paying Living Expenses: Not hard at all  Food Insecurity: No Food Insecurity (09/06/2022)   Hunger Vital Sign    Worried About Running Out of Food in the Last Year: Never true    Ran Out of Food in the Last Year: Never true  Transportation Needs: No Transportation Needs (09/06/2022)   PRAPARE - Hydrologist (Medical): No    Lack of Transportation (Non-Medical): No  Physical Activity: Insufficiently Active (09/06/2022)   Exercise Vital Sign    Days of Exercise per Week: 1 day    Minutes of Exercise per Session: 20 min  Stress: No Stress Concern Present (09/06/2022)  Tracy Questionnaire    Feeling of Stress : Only a little  Social Connections: Moderately Integrated (09/06/2022)   Social Connection and Isolation Panel [NHANES]    Frequency of Communication with Friends and Family: Once a week    Frequency of Social Gatherings with Friends and Family: Three times a week    Attends Religious Services: More than 4 times per year    Active Member of Clubs or Organizations: No    Attends Archivist Meetings: Not on file    Marital Status: Married  Human resources officer Violence: Not At Risk (01/25/2023)   Humiliation, Afraid, Rape, and Kick questionnaire    Fear of Current or Ex-Partner: No    Emotionally Abused: No    Physically Abused: No    Sexually Abused: No    FAMILY HISTORY Family History  Problem Relation Age of Onset   Arthritis Mother    Stroke Mother    Miscarriages / [place] Mother    Arthritis Father    Dementia Maternal Grandmother    Emphysema Maternal Grandfather     ALLERGIES:  is allergic to molds & smuts, acetaminophen, cephalexin, pneumococcal vaccines, prednisone, sesame oil, adhesive [tape], dust mite extract, and medical  adhesive remover.  MEDICATIONS:  Current Outpatient Medications  Medication Sig Dispense Refill   albuterol (VENTOLIN HFA) 108 (90 Base) MCG/ACT inhaler Inhale 2 puffs into the lungs every 4 (four) hours as needed for wheezing or shortness of breath. 18 g 1   diphenhydrAMINE (BENADRYL) 25 mg capsule Take by mouth.     EPINEPHrine (AUVI-Q) 0.3 mg/0.3 mL IJ SOAJ injection Inject 0.3 mg into the muscle as needed for anaphylaxis. 1 each 2   Fluticasone Furoate (ARNUITY ELLIPTA) 100 MCG/ACT AEPB Inhale 1 puff into the lungs daily. Rinse mouth after each use. 90 each 2   Glucagon 1 MG/0.2ML SOAJ Inject 1 mg into the skin as directed. 0.2 mL 1   Microlet Lancets MISC      montelukast (SINGULAIR) 10 MG tablet Take 1 tablet (10 mg total) by mouth at bedtime. 90 tablet 2   Multiple Vitamin (MULTIVITAMIN) tablet Take 1 tablet by mouth daily.     NON FORMULARY Continuous glucose monitor     NOVOLOG 100 UNIT/ML injection SMARTSIG:0-40 Unit(s) SUB-Q Daily     No current facility-administered medications for this visit.    PHYSICAL EXAMINATION:  ECOG PERFORMANCE STATUS: 0 - Asymptomatic   Vitals:   02/15/23 1404  BP: 128/75  Pulse: 64  Resp: 18  Temp: (!) 97.5 F (36.4 C)  SpO2: 100%    Filed Weights   02/15/23 1404  Weight: 100 lb 7 oz (45.6 kg)     Physical Exam Vitals and nursing note reviewed.  Constitutional:      Appearance: Normal appearance. She is normal weight. She is not toxic-appearing or diaphoretic.     Comments: Here alone.  Tired appearing  HENT:     Head: Normocephalic and atraumatic.     Right Ear: External ear normal.     Left Ear: External ear normal.     Nose: Nose normal. No congestion or rhinorrhea.  Eyes:     General: No scleral icterus.    Extraocular Movements: Extraocular movements intact.     Conjunctiva/sclera: Conjunctivae normal.     Pupils: Pupils are equal, round, and reactive to light.  Cardiovascular:     Rate and Rhythm: Normal rate and  regular rhythm.  Heart sounds: No murmur heard.    No friction rub. No gallop.  Pulmonary:     Effort: Pulmonary effort is normal. No respiratory distress.     Breath sounds: Normal breath sounds.  Abdominal:     General: Bowel sounds are normal.     Palpations: Abdomen is soft.     Tenderness: There is no abdominal tenderness. There is no guarding or rebound.  Musculoskeletal:        General: No swelling, tenderness or deformity.     Cervical back: Normal range of motion and neck supple. No rigidity or tenderness.     Right lower leg: No edema.     Left lower leg: No edema.  Lymphadenopathy:     Head:     Right side of head: No submental, submandibular, tonsillar, preauricular, posterior auricular or occipital adenopathy.     Left side of head: No submental, submandibular, tonsillar, preauricular, posterior auricular or occipital adenopathy.     Cervical: No cervical adenopathy.     Right cervical: No superficial, deep or posterior cervical adenopathy.    Left cervical: No superficial, deep or posterior cervical adenopathy.     Upper Body:     Right upper body: No supraclavicular, axillary, pectoral or epitrochlear adenopathy.     Left upper body: No supraclavicular, axillary, pectoral or epitrochlear adenopathy.  Skin:    General: Skin is warm.     Coloration: Skin is not jaundiced.  Neurological:     General: No focal deficit present.     Mental Status: She is alert and oriented to person, place, and time.     Cranial Nerves: No cranial nerve deficit.     Motor: No weakness.     Gait: Gait normal.  Psychiatric:        Mood and Affect: Mood normal.        Behavior: Behavior normal.        Thought Content: Thought content normal.        Judgment: Judgment normal.     LABORATORY DATA: I have personally reviewed the data as listed:  Orders Only on 01/25/2023  Component Date Value Ref Range Status   BCR ABL1 / ABL1 01/25/2023 See Scanned report in [place]    Final   Performed at [facility], [address] [place], [place] [address]  Appointment on 01/25/2023  Component Date Value Ref Range Status   Vitamin B-12 01/25/2023 687  180 - 914 pg/mL Final   Comment: (NOTE) This assay is not validated for testing neonatal or myeloproliferative syndrome specimens for Vitamin B12 levels. Performed at [facility], [place] [address] [place], [place] [address]    Haptoglobin 01/25/2023 209  33 - 278 mg/dL Final   Comment: (NOTE) Performed At: [facility] [place], [place] [id number] Rush Farmer MD [id number]    CRP 01/25/2023 0.6  <1.0 mg/dL Final   Performed at [facility], [place] [address], [place], [place] [address]   Sed Rate 01/25/2023 29 (H)  0 - 22 mm/hr Final   Performed at [facility], [place] [address], [place], [place] [address]   Hgb F 01/25/2023 0.0  0.0 - 2.0 % Final   Hgb A 01/25/2023 97.3  96.4 - 98.8 % Final   Hgb A2 01/25/2023 2.7  1.8 - 3.2 % Final   Hgb S 01/25/2023 0.0  0.0 % Final   Interpretation, Hgb Fract 01/25/2023 Comment  Final   Comment: (NOTE) Normal hemoglobin present; no hemoglobin variant or beta thalassemia identified. Note: Alpha thalassemia may not be detected by the Hgb Fractionation Cascade panel. If alpha thalassemia is suspected, [facility] offers Alpha-Thalassemia DNA Analysis [phone]). Performed At: [facility] [place], [place] [id number] Rush Farmer MD [id number]    Ferritin 01/25/2023 68  11 - 307 ng/mL Final   Performed at [facility], [address], [place], [place] [address]   WBC MORPHOLOGY 01/25/2023 MORPHOLOGY UNREMARKABLE   Final   RBC MORPHOLOGY 01/25/2023 MORPHOLOGY UNREMARKABLE   Final   Plt Morphology 01/25/2023 Few large platelets   Final   Clinical Information 01/25/2023 thrmobocytosis   Final   Performed at [facility], [place] [address], [place], [place] [address]   JAK 2, MPL, Glendell Docker, [facility] 01/25/2023 See Scanned report in [place]   Final   Performed at [facility], [place] [address], [place], [place] [address]  Office Visit on 01/25/2023  Component Date Value Ref Range Status   WBC 01/25/2023 4.3  4.0 - 10.5 K/uL Final   RBC 01/25/2023 4.67  3.87 - 5.11 MIL/uL Final   Hemoglobin 01/25/2023 13.9  12.0 - 15.0 g/dL Final   HCT 01/25/2023 40.7  36.0 - 46.0 % Final   MCV 01/25/2023 87.2  80.0 - 100.0 fL Final   MCH 01/25/2023 29.8  26.0 - 34.0 pg Final   MCHC 01/25/2023 34.2  30.0 - 36.0 g/dL Final   RDW 01/25/2023 14.3  11.5 - 15.5 % Final   Platelets 01/25/2023 645 (H)  150 - 400 K/uL Final   nRBC 01/25/2023 0.0  0.0 - 0.2 % Final   Neutrophils Relative % 01/25/2023 56  % Final   Neutro Abs 01/25/2023 2.4  1.7 - 7.7 K/uL Final   Lymphocytes Relative 01/25/2023 32  % Final   Lymphs Abs 01/25/2023 1.4  0.7 - 4.0 K/uL Final   Monocytes Relative 01/25/2023 9  % Final   Monocytes Absolute 01/25/2023 0.4  0.1 - 1.0 K/uL Final   Eosinophils Relative 01/25/2023 0  % Final   Eosinophils Absolute 01/25/2023 0.0  0.0 - 0.5 K/uL Final   Basophils Relative 01/25/2023 2  % Final   Basophils Absolute 01/25/2023 0.1  0.0 - 0.1 K/uL Final   Immature Granulocytes 01/25/2023 1  % Final   Abs Immature Granulocytes 01/25/2023 0.02  0.00 - 0.07 K/uL Final   Performed at [facility], [place] [address] [place], [place] [address]  Office Visit on 01/18/2023  Component Date Value Ref Range Status   WBC 01/18/2023 6.4  4.0 - 10.5 K/uL Final   RBC 01/18/2023 4.00  3.87 - 5.11 Mil/uL Final   Hemoglobin 01/18/2023 12.0  12.0 - 15.0 g/dL Final   HCT 01/18/2023 35.6 (L)  36.0 - 46.0 % Final   MCV 01/18/2023 89.0  78.0 - 100.0 fl Final   MCHC 01/18/2023 33.7  30.0 - 36.0 g/dL Final   RDW 01/18/2023 14.3  11.5 - 15.5 % Final   Platelets 01/18/2023 891.0 (H)  150.0 -  400.0 K/uL Final   Neutrophils Relative % 01/18/2023 73.6  43.0 - 77.0 % Final   Lymphocytes Relative 01/18/2023 19.5  12.0 - 46.0 % Final   Monocytes Relative 01/18/2023 5.5  3.0 - 12.0 % Final   Eosinophils Relative 01/18/2023 0.7  0.0 - 5.0 % Final   Basophils Relative 01/18/2023 0.7  0.0 - 3.0 %  Final   Neutro Abs 01/18/2023 4.7  1.4 - 7.7 K/uL Final   Lymphs Abs 01/18/2023 1.3  0.7 - 4.0 K/uL Final   Monocytes Absolute 01/18/2023 0.4  0.1 - 1.0 K/uL Final   Eosinophils Absolute 01/18/2023 0.0  0.0 - 0.7 K/uL Final   Basophils Absolute 01/18/2023 0.0  0.0 - 0.1 K/uL Final   Sodium 01/18/2023 139  135 - 145 mEq/L Final   Potassium 01/18/2023 4.2  3.5 - 5.1 mEq/L Final   Chloride 01/18/2023 99  96 - 112 mEq/L Final   CO2 01/18/2023 31  19 - 32 mEq/L Final   Glucose, Bld 01/18/2023 96  70 - 99 mg/dL Final   BUN 01/18/2023 13  6 - 23 mg/dL Final   Creatinine, Ser 01/18/2023 0.67  0.40 - 1.20 mg/dL Final   Total Bilirubin 01/18/2023 0.5  0.2 - 1.2 mg/dL Final   Alkaline Phosphatase 01/18/2023 75  39 - 117 U/L Final   AST 01/18/2023 14  0 - 37 U/L Final   ALT 01/18/2023 12  0 - 35 U/L Final   Total Protein 01/18/2023 7.6  6.0 - 8.3 g/dL Final   Albumin 01/18/2023 3.9  3.5 - 5.2 g/dL Final   GFR 01/18/2023 112.24  >60.00 mL/min Final   Calculated using the CKD-EPI Creatinine Equation (2021)   Calcium 01/18/2023 9.6  8.4 - 10.5 mg/dL Final   Hgb A1c MFr Bld 01/18/2023 9.3 (H)  4.6 - 6.5 % Final   Glycemic Control Guidelines for People with Diabetes:Non Diabetic:  <6%Goal of Therapy: <7%Additional Action Suggested:  >8%    Cholesterol 01/18/2023 131  0 - 200 mg/dL Final   ATP III Classification       Desirable:  < 200 mg/dL               Borderline High:  200 - 239 mg/dL          High:  > = 240 mg/dL   Triglycerides 01/18/2023 37.0  0.0 - 149.0 mg/dL Final   Normal:  <150 mg/dLBorderline High:  150 - 199 mg/dL   HDL 01/18/2023 81.20  >39.00 mg/dL Final   VLDL 01/18/2023 7.4  0.0 - 40.0  mg/dL Final   LDL Cholesterol 01/18/2023 42  0 - 99 mg/dL Final   Total CHOL/HDL Ratio 01/18/2023 2   Final                  Men          Women1/2 Average Risk     3.4          3.3Average Risk          5.0          4.42X Average Risk          9.6          7.13X Average Risk          15.0          11.0                       NonHDL 01/18/2023 49.76   Final   NOTE:  Non-HDL goal should be 30 mg/dL higher than patient's LDL goal (i.e. LDL goal of < 70 mg/dL, would have non-HDL goal of < 100 mg/dL)   TSH 01/18/2023 0.60  0.35 - 5.50 uIU/mL Final   Microalb, Ur 01/18/2023 <0.7  0.0 - 1.9 mg/dL Final   Creatinine,U 01/18/2023 109.5  mg/dL  Final   Microalb Creat Ratio 01/18/2023 0.6  0.0 - 30.0 mg/g Final    RADIOGRAPHIC STUDIES: I have personally reviewed the radiological images as listed and agree with the findings in the report  No results found.  ASSESSMENT/PLAN  Thrombocytosis Platelet count of ?450  109/L is a generally accepted value. A cohort study evaluating 10,000 [place] patients found a platelet count >  than 409  109/L for women and 381  109/L for men represented the 99th percentile in this population February 15 2023- Likely etiology in this patient is chronic inflammation due to autoimmune disease as judged by her history of DM type I and Hashimoto's thyroiditis.  Other contributors are likely recurrent respiratory infections transmitted to her by children.  Platelets are acute phase reactants  Repeat CBC today notable for PLT 393 which is normal  Recurrent respiratory infections   February 15 2023- Will obtain SPEP with IEP, quantitative immunoglobulins  Likely cause is transmission of unfamiliar pathogens from her children in daycare    Cancer Staging  No matching staging information was found for the patient.   No problem-specific Assessment & Plan notes found for this encounter.   No orders of the defined types were placed in this encounter.  30  minutes was spent in  patient care.  This included time spent preparing to see the patient (e.g., review of tests), obtaining and/or reviewing separately obtained history, counseling and educating the patient/, ordering tests, documenting clinical information in the electronic or other health record, independently interpreting results and communicating results to the patient as well as coordination of care.      All questions were answered. The patient knows to call the clinic with any problems, questions or concerns.  This note was electronically signed.    Barbee Cough, MD  02/15/2023 2:19 PM

## 2023-02-19 LAB — IGG 1, 2, 3, AND 4
IgG (Immunoglobin G), Serum: 2102 mg/dL — ABNORMAL HIGH (ref 586–1602)
IgG, Subclass 1: 1162 mg/dL — ABNORMAL HIGH (ref 248–810)
IgG, Subclass 2: 482 mg/dL (ref 130–555)
IgG, Subclass 3: 70 mg/dL (ref 15–102)
IgG, Subclass 4: 69 mg/dL (ref 2–96)

## 2023-02-21 ENCOUNTER — Encounter: Payer: Self-pay | Admitting: Oncology

## 2023-02-21 LAB — MULTIPLE MYELOMA PANEL, SERUM
Albumin SerPl Elph-Mcnc: 3.7 g/dL (ref 2.9–4.4)
Albumin/Glob SerPl: 1 (ref 0.7–1.7)
Alpha 1: 0.7 g/dL — ABNORMAL HIGH (ref 0.0–0.4)
Alpha2 Glob SerPl Elph-Mcnc: 0.5 g/dL (ref 0.4–1.0)
B-Globulin SerPl Elph-Mcnc: 0.9 g/dL (ref 0.7–1.3)
Gamma Glob SerPl Elph-Mcnc: 1.9 g/dL — ABNORMAL HIGH (ref 0.4–1.8)
Globulin, Total: 4 g/dL — ABNORMAL HIGH (ref 2.2–3.9)
IgA: 489 mg/dL — ABNORMAL HIGH (ref 87–352)
IgG (Immunoglobin G), Serum: 2040 mg/dL — ABNORMAL HIGH (ref 586–1602)
IgM (Immunoglobulin M), Srm: 130 mg/dL (ref 26–217)
Total Protein ELP: 7.7 g/dL (ref 6.0–8.5)

## 2023-02-28 ENCOUNTER — Other Ambulatory Visit: Payer: Self-pay

## 2023-02-28 DIAGNOSIS — J988 Other specified respiratory disorders: Secondary | ICD-10-CM | POA: Insufficient documentation

## 2023-03-01 ENCOUNTER — Telehealth: Payer: Self-pay | Admitting: Oncology

## 2023-03-01 NOTE — Telephone Encounter (Signed)
Cancelled April's appointments per patient's request.

## 2023-03-14 ENCOUNTER — Inpatient Hospital Stay: Payer: PRIVATE HEALTH INSURANCE

## 2023-03-14 LAB — HEMOGLOBIN A1C: Hemoglobin A1C: 8.4

## 2023-03-15 ENCOUNTER — Telehealth: Payer: PRIVATE HEALTH INSURANCE | Admitting: Oncology

## 2023-04-08 NOTE — Progress Notes (Deleted)
Follow Up Note  RE: Nicole Carrillo MRN: [id number] DOB: Aug 21, 1986 Date of Office Visit: 04/09/2023  Referring provider: Natalia Leatherwood, DO Primary care provider: Natalia Leatherwood, DO  Chief Complaint: No chief complaint on file.  History of Present Illness: I had the pleasure of seeing Nicole Carrillo for a follow up visit at the [facility] on 04/08/2023. She is a 37 y.o. female, who is being followed for food allergy, urticaria, asthma, allergic rhinoconjunctivitis and globus sensation. Her previous allergy office visit was on 12/06/2022 with Dr. Selena Batten. Today is a regular follow up visit.  Other adverse food reactions, not elsewhere classified, subsequent encounter Past history - 2018 skin testing positive to soy, peanut and borderline to wheat. She had no prior clinical reactions to these foods. 2021 bloodwork borderline positive to hazelnut, peanut, macademia nut, pistachios and soy. Positive to sesame seed. Tolerates soy, wheat, peanuts, tree nuts, corn.  Interim history - no reactions. Today's skin prick testing negative to shellfish.  Slight globus sensation with lip rub and 1g crab dose. 1 g crab dose repeated with no worsening symptoms.  Tolerated a total of 58g of crab with no issues.  Removed shellfish from allergy list.  Continue to avoid sesame. Get bloodwork for sesame.  For mild symptoms you can take over the counter antihistamines such as Benadryl and monitor symptoms closely. If symptoms worsen or if you have severe symptoms including breathing issues, throat closure, significant swelling, whole body hives, severe diarrhea and vomiting, lightheadedness then seek immediate medical care.   Urticaria Lately breaking out daily and resolves within a few hours. No triggers noted. Hesitant about taking daily antihistamines. Based on clinical history, she likely has chronic idiopathic urticaria. Discussed with patient, that urticaria is usually caused by release  of histamine by cutaneous mast cells but sometimes it is non-histamine mediated. Explained that urticaria is not always associated with allergies. In most cases, the exact etiology for urticaria can not be established and it is considered idiopathic. Meanwhile start the following medications:  Use over the counter antihistamines such as Zyrtec (cetirizine), Claritin (loratadine), Allegra (fexofenadine), or Xyzal (levocetirizine) daily as needed.  May switch antihistamines every few months. Avoid the following potential triggers: alcohol, tight clothing, NSAIDs.    Asthma Past history - Diagnosed with asthma in 2018 by pulmonology via  Methacholine challenge test - data not available for review. Tried Arnuity, Breo with no benefit. No triggers noted. Normal EGD in past per patient report. 2021 spirometry was normal with marginal improvement in FEV1 post bronchodilator treatment. Clinically feeling about the same. Interim history - no issues.   Normal spirometry today. Daily controller medication(s):  Start Arnuity 1 puff once a day and rinse mouth after each use. If you notice worsening symptoms then okay to go back to [facility] as before.  Stop Breo.  Continue Singulair (montelukast) 10mg  daily at night. During upper respiratory infections/asthma flares:  Start Breo 1 puff once a day for 1-2 weeks until your breathing symptoms return to baseline.  Pretreat with albuterol 2 puffs. May use albuterol rescue inhaler 2 puffs every 4 to 6 hours as needed for shortness of breath, chest tightness, coughing, and wheezing. May use albuterol rescue inhaler 2 puffs 5 to 15 minutes prior to strenuous physical activities. Monitor frequency of use.  Get spirometry at next visit.   Seasonal and perennial allergic rhinoconjunctivitis Past history - Perennial rhino conjunctivitis symptoms for the past 3 years. 2018 skin testing  by ENT was positive to grass, ragweed, weed, trees, molds, dust mites,  cockroach, horse. SLIT therapy by ENT x few months with no benefit. Deviated septum repair and turbinate reduction in 2019. 2021 bloodwork positive to dust mites, grass pollen, mold, tree pollen, ragweed pollen and weed pollen. Dymista caused nausea.  Continue environmental control measures.  Continue Singulair (montelukast) 10mg  daily at night. Hold AIT until rash/hives improved and all food challenges completed.   Globus sensation Normal work up by ENT and had normal EGD. Swallow study showed dysphagia? Scheduled for manometry test Follow up with GI as scheduled.   Assessment and Plan: Nicole Carrillo is a 37 y.o. female with: No problem-specific Assessment & Plan notes found for this encounter.  No follow-ups on file.  No orders of the defined types were placed in this encounter.  Lab Orders  No laboratory test(s) ordered today    Diagnostics: Spirometry:  Tracings reviewed. Her effort: {Blank single:19197::"Good reproducible efforts.","It was hard to get consistent efforts and there is a question as to whether this reflects a maximal maneuver.","Poor effort, data can not be interpreted."} FVC: ***L FEV1: ***L, ***% predicted FEV1/FVC ratio: ***% Interpretation: {Blank single:19197::"Spirometry consistent with mild obstructive disease","Spirometry consistent with moderate obstructive disease","Spirometry consistent with severe obstructive disease","Spirometry consistent with possible restrictive disease","Spirometry consistent with mixed obstructive and restrictive disease","Spirometry uninterpretable due to technique","Spirometry consistent with normal pattern","No overt abnormalities noted given today's efforts"}.  Please see scanned spirometry results for details.  Skin Testing: {Blank single:19197::"Select foods","Environmental allergy panel","Environmental allergy panel and select foods","Food allergy panel","None","Deferred due to recent antihistamines use"}. *** Results discussed  with patient/family.   Medication List:  Current Outpatient Medications  Medication Sig Dispense Refill  . albuterol (VENTOLIN HFA) 108 (90 Base) MCG/ACT inhaler Inhale 2 puffs into the lungs every 4 (four) hours as needed for wheezing or shortness of breath. 18 g 1  . diphenhydrAMINE (BENADRYL) 25 mg capsule Take 25 mg by mouth as needed.    Marland Kitchen EPINEPHrine (AUVI-Q) 0.3 mg/0.3 mL IJ SOAJ injection Inject 0.3 mg into the muscle as needed for anaphylaxis. (Patient not taking: Reported on 02/15/2023) 1 each 2  . Fluticasone Furoate (ARNUITY ELLIPTA) 100 MCG/ACT AEPB Inhale 1 puff into the lungs daily. Rinse mouth after each use. 90 each 2  . Glucagon 1 MG/0.2ML SOAJ Inject 1 mg into the skin as directed. 0.2 mL 1  . Microlet Lancets MISC     . montelukast (SINGULAIR) 10 MG tablet Take 1 tablet (10 mg total) by mouth at bedtime. 90 tablet 2  . Multiple Vitamin (MULTIVITAMIN) tablet Take 1 tablet by mouth daily.    . NON FORMULARY Continuous glucose monitor    . NOVOLOG 100 UNIT/ML injection SMARTSIG:0-40 Unit(s) SUB-Q Daily     No current facility-administered medications for this visit.   Allergies: Allergies  Allergen Reactions  . Molds & Smuts Itching and Shortness Of Breath  . Acetaminophen Other (See Comments)    Effects blood sugars and insulin pump.  . Cephalexin Nausea Only and Other (See Comments)    Headaches, dizziness  . Pneumococcal Vaccines Swelling    Local injection swelling  . Prednisone Nausea Only and Other (See Comments)    Headaches, dizziness, Prefers not to take because of uncontrollable BG's on this med;     Marland Kitchen Sesame Oil   . Adhesive [Tape] Rash    With extended exposure  . Dust Mite Extract Rash  . Medical Adhesive Remover Rash    With extended exposure   I  reviewed her past medical history, social history, family history, and environmental history and no significant changes have been reported from her previous visit.  Review of Systems  Constitutional:   Negative for appetite change, chills, fever and unexpected weight change.  HENT:  Negative for congestion and rhinorrhea.        Globus sensation  Eyes:  Negative for itching.  Respiratory:  Negative for cough, chest tightness, shortness of breath and wheezing.   Cardiovascular:  Negative for chest pain.  Gastrointestinal:  Negative for abdominal pain.  Genitourinary:  Negative for difficulty urinating.  Skin:  Positive for rash.  Allergic/Immunologic: Positive for environmental allergies.  Neurological:  Negative for headaches.   Objective: There were no vitals taken for this visit. There is no height or weight on file to calculate BMI. Physical Exam Vitals and nursing note reviewed.  Constitutional:      Appearance: Normal appearance. She is well-developed.  HENT:     Head: Normocephalic and atraumatic.     Right Ear: Tympanic membrane and external ear normal.     Left Ear: Tympanic membrane and external ear normal.     Nose: Nose normal.     Mouth/Throat:     Mouth: Mucous membranes are moist.     Pharynx: Oropharynx is clear.  Eyes:     Conjunctiva/sclera: Conjunctivae normal.  Cardiovascular:     Rate and Rhythm: Normal rate and regular rhythm.     Heart sounds: Normal heart sounds. No murmur heard. Pulmonary:     Effort: Pulmonary effort is normal.     Breath sounds: Normal breath sounds. No wheezing, rhonchi or rales.  Musculoskeletal:     Cervical back: Neck supple.  Skin:    General: Skin is warm.     Findings: No rash.  Neurological:     Mental Status: She is alert and oriented to person, place, and time.  Psychiatric:        Mood and Affect: Mood normal.        Behavior: Behavior normal.  Previous notes and tests were reviewed. The plan was reviewed with the patient/family, and all questions/concerned were addressed.  It was my pleasure to see Inaara today and participate in her care. Please feel free to contact me with any questions or  concerns.  Sincerely,  Wyline Mood, DO Allergy & Immunology  [facility] office: [phone] [facility] office: [phone]

## 2023-04-09 ENCOUNTER — Ambulatory Visit: Payer: BC Managed Care – PPO | Admitting: Allergy

## 2023-04-09 DIAGNOSIS — L503 Dermatographic urticaria: Secondary | ICD-10-CM

## 2023-04-09 DIAGNOSIS — L509 Urticaria, unspecified: Secondary | ICD-10-CM

## 2023-04-09 DIAGNOSIS — T781XXD Other adverse food reactions, not elsewhere classified, subsequent encounter: Secondary | ICD-10-CM

## 2023-04-09 DIAGNOSIS — R09A2 Foreign body sensation, throat: Secondary | ICD-10-CM

## 2023-04-09 DIAGNOSIS — J4540 Moderate persistent asthma, uncomplicated: Secondary | ICD-10-CM

## 2023-04-09 DIAGNOSIS — H1010 Acute atopic conjunctivitis, unspecified eye: Secondary | ICD-10-CM
